# Patient Record
Sex: Male | Born: 1940
Health system: Southern US, Community
[De-identification: ages and names within clinical notes are randomized; demographics above are authoritative.]

## PROBLEM LIST (undated history)

## (undated) DIAGNOSIS — I10 Essential (primary) hypertension: Secondary | ICD-10-CM

## (undated) DIAGNOSIS — C801 Malignant (primary) neoplasm, unspecified: Secondary | ICD-10-CM

## (undated) HISTORY — PX: COLONOSCOPY: SHX174

---

## 2010-02-03 ENCOUNTER — Emergency Department (HOSPITAL_COMMUNITY): Admission: EM | Admit: 2010-02-03 | Discharge: 2010-02-03 | Payer: Self-pay | Admitting: Family Medicine

## 2010-02-18 ENCOUNTER — Ambulatory Visit: Payer: Self-pay | Admitting: Internal Medicine

## 2010-02-18 DIAGNOSIS — F411 Generalized anxiety disorder: Secondary | ICD-10-CM | POA: Insufficient documentation

## 2010-02-18 DIAGNOSIS — I10 Essential (primary) hypertension: Secondary | ICD-10-CM

## 2010-02-18 DIAGNOSIS — H409 Unspecified glaucoma: Secondary | ICD-10-CM | POA: Insufficient documentation

## 2010-02-18 DIAGNOSIS — E785 Hyperlipidemia, unspecified: Secondary | ICD-10-CM

## 2010-02-18 LAB — CONVERTED CEMR LAB
Albumin: 4.3 g/dL (ref 3.5–5.2)
Basophils Absolute: 0.1 10*3/uL (ref 0.0–0.1)
Basophils Relative: 0.7 % (ref 0.0–3.0)
Bilirubin Urine: NEGATIVE
CO2: 32 meq/L (ref 19–32)
Calcium: 9.7 mg/dL (ref 8.4–10.5)
Chloride: 100 meq/L (ref 96–112)
Cholesterol: 334 mg/dL — ABNORMAL HIGH (ref 0–200)
Eosinophils Absolute: 0.1 10*3/uL (ref 0.0–0.7)
Glucose, Bld: 89 mg/dL (ref 70–99)
HCT: 44.2 % (ref 39.0–52.0)
HDL: 39.5 mg/dL (ref >39.00)
Hemoglobin: 14.7 g/dL (ref 13.0–17.0)
Ketones, ur: NEGATIVE mg/dL
Leukocytes, UA: NEGATIVE
Lymphs Abs: 1.4 10*3/uL (ref 0.7–4.0)
MCHC: 33.3 g/dL (ref 30.0–36.0)
MCV: 78.9 fL (ref 78.0–100.0)
Monocytes Absolute: 0.7 10*3/uL (ref 0.1–1.0)
Neutro Abs: 4.7 10*3/uL (ref 1.4–7.7)
Nitrite: NEGATIVE
PSA: 2.31 ng/mL (ref 0.10–4.00)
RBC: 5.61 M/uL (ref 4.22–5.81)
RDW: 14.4 % (ref 11.5–14.6)
Sodium: 140 meq/L (ref 135–145)
Specific Gravity, Urine: >=1.03 (ref 1.000–1.030)
Total Protein, Urine: NEGATIVE mg/dL
Total Protein: 7.6 g/dL (ref 6.0–8.3)
VLDL: 25.4 mg/dL (ref 0.0–40.0)
pH: 5 (ref 5.0–8.0)

## 2010-02-26 ENCOUNTER — Encounter (INDEPENDENT_AMBULATORY_CARE_PROVIDER_SITE_OTHER): Payer: Self-pay | Admitting: *Deleted

## 2010-03-03 ENCOUNTER — Encounter (INDEPENDENT_AMBULATORY_CARE_PROVIDER_SITE_OTHER): Payer: Self-pay

## 2010-03-05 ENCOUNTER — Ambulatory Visit: Payer: Self-pay | Admitting: Internal Medicine

## 2010-03-19 ENCOUNTER — Ambulatory Visit: Payer: Self-pay | Admitting: Internal Medicine

## 2010-03-19 ENCOUNTER — Telehealth: Payer: Self-pay | Admitting: Internal Medicine

## 2010-03-26 ENCOUNTER — Encounter: Payer: Self-pay | Admitting: Internal Medicine

## 2010-03-27 ENCOUNTER — Ambulatory Visit: Payer: Self-pay | Admitting: Internal Medicine

## 2010-03-27 ENCOUNTER — Inpatient Hospital Stay (HOSPITAL_COMMUNITY): Admission: EM | Admit: 2010-03-27 | Discharge: 2010-03-29 | Payer: Self-pay | Admitting: Emergency Medicine

## 2010-04-02 ENCOUNTER — Ambulatory Visit: Payer: Self-pay | Admitting: Internal Medicine

## 2010-04-07 ENCOUNTER — Encounter: Payer: Self-pay | Admitting: Internal Medicine

## 2010-05-06 ENCOUNTER — Ambulatory Visit: Payer: Self-pay | Admitting: Internal Medicine

## 2010-05-06 DIAGNOSIS — N529 Male erectile dysfunction, unspecified: Secondary | ICD-10-CM

## 2010-05-06 LAB — CONVERTED CEMR LAB
BUN: 17 mg/dL (ref 6–23)
Basophils Relative: 0.4 % (ref 0.0–3.0)
Bilirubin, Direct: 0 mg/dL (ref 0.0–0.3)
CO2: 27 meq/L (ref 19–32)
Chloride: 109 meq/L (ref 96–112)
Eosinophils Relative: 2.4 % (ref 0.0–5.0)
GFR calc non Af Amer: 79.97 mL/min (ref >60)
Glucose, Bld: 101 mg/dL — ABNORMAL HIGH (ref 70–99)
HDL: 43.1 mg/dL (ref >39.00)
Hemoglobin: 12.4 g/dL — ABNORMAL LOW (ref 13.0–17.0)
Lymphocytes Relative: 18.2 % (ref 12.0–46.0)
MCHC: 32.8 g/dL (ref 30.0–36.0)
MCV: 80.1 fL (ref 78.0–100.0)
Neutro Abs: 5.5 10*3/uL (ref 1.4–7.7)
Neutrophils Relative %: 70.5 % (ref 43.0–77.0)
Potassium: 4.5 meq/L (ref 3.5–5.1)
RBC: 4.73 M/uL (ref 4.22–5.81)
Sodium: 143 meq/L (ref 135–145)
Total Bilirubin: 0.5 mg/dL (ref 0.3–1.2)
Total CHOL/HDL Ratio: 5
Triglycerides: 87 mg/dL (ref 0.0–149.0)
WBC: 7.8 10*3/uL (ref 4.5–10.5)

## 2010-05-07 ENCOUNTER — Encounter: Payer: Self-pay | Admitting: Internal Medicine

## 2010-05-07 LAB — CONVERTED CEMR LAB
Sex Hormone Binding: 54 nmol/L (ref 13–71)
Testosterone Free: 45.9 pg/mL — ABNORMAL LOW (ref 47.0–244.0)
Testosterone-% Free: 1.4 % — ABNORMAL LOW (ref 1.6–2.9)

## 2010-05-18 ENCOUNTER — Ambulatory Visit: Payer: Self-pay | Admitting: Internal Medicine

## 2010-05-18 ENCOUNTER — Telehealth: Payer: Self-pay | Admitting: Internal Medicine

## 2010-05-18 DIAGNOSIS — Z860100 Personal history of colon polyps, unspecified: Secondary | ICD-10-CM | POA: Insufficient documentation

## 2010-05-18 DIAGNOSIS — D126 Benign neoplasm of colon, unspecified: Secondary | ICD-10-CM

## 2010-05-18 DIAGNOSIS — D62 Acute posthemorrhagic anemia: Secondary | ICD-10-CM

## 2010-06-01 ENCOUNTER — Encounter (INDEPENDENT_AMBULATORY_CARE_PROVIDER_SITE_OTHER): Payer: Self-pay | Admitting: *Deleted

## 2010-08-11 ENCOUNTER — Ambulatory Visit: Payer: Self-pay | Admitting: Internal Medicine

## 2010-08-11 DIAGNOSIS — R51 Headache: Secondary | ICD-10-CM

## 2010-08-11 DIAGNOSIS — R519 Headache, unspecified: Secondary | ICD-10-CM | POA: Insufficient documentation

## 2010-10-08 ENCOUNTER — Ambulatory Visit: Payer: Self-pay | Admitting: Internal Medicine

## 2010-10-08 LAB — CONVERTED CEMR LAB
BUN: 23 mg/dL (ref 6–23)
Calcium: 9.1 mg/dL (ref 8.4–10.5)
Chloride: 107 meq/L (ref 96–112)
Cholesterol, target level: 200 mg/dL
Creatinine, Ser: 1.1 mg/dL (ref 0.4–1.5)
Eosinophils Absolute: 0.3 10*3/uL (ref 0.0–0.7)
Eosinophils Relative: 3.4 % (ref 0.0–5.0)
LDL Goal: 130 mg/dL
Lymphocytes Relative: 18.3 % (ref 12.0–46.0)
MCV: 79.3 fL (ref 78.0–100.0)
Monocytes Absolute: 1 10*3/uL (ref 0.1–1.0)
Neutrophils Relative %: 66.7 % (ref 43.0–77.0)
Platelets: 256 10*3/uL (ref 150.0–400.0)
TSH: 1.97 microintl units/mL (ref 0.35–5.50)
Transferrin: 292.2 mg/dL (ref 212.0–360.0)
Vitamin B-12: 466 pg/mL (ref 211–911)
WBC: 8.8 10*3/uL (ref 4.5–10.5)

## 2010-12-01 NOTE — Assessment & Plan Note (Signed)
Summary: 3 MO ROV /NWS   Vital Signs:  Patient profile:   70 year old male Height:      67.5 inches Weight:      180 pounds BMI:     27.88 O2 Sat:      97 % on Room air Temp:     98.9 degrees F oral Pulse rate:   48 / minute BP sitting:   162 / 88  (left arm) Cuff size:   regular  Vitals Entered By: Zella Ball Ewing CMA Duncan Dull) (August 11, 2010 10:41 AM)  O2 Flow:  Room air CC: 3 month ROV/RE   Primary Care Provider:  Oliver Barre, MD  CC:  3 month ROV/RE.  History of Present Illness: here for f/u routine ;  Pt denies CP, worsening sob, doe, wheezing, orthopnea, pnd, worsening LE edema, palps, dizziness or syncope  Pt denies new neuro symptoms such as facial or extremity weakness No fever, wt loss, night sweats, loss of appetite or other constitutional symptoms  Pt denies polydipsia, polyuria  Overall good compliance with meds, trying to follow low chol, DM diet, wt stable, little excercise however .  Does have c/o also of nagging HA yesterday and could not play golf but resolved today.   No other new complaints.  Problems Prior to Update: 1)  Colonic Polyps, Adenomatous, Hx of  (ICD-V12.72) 2)  Colonic Polyps, Adenomatous  (ICD-211.3) 3)  Hepatotoxicity, Drug-induced, Risk of  (ICD-V58.69) 4)  Erectile Dysfunction, Organic  (ICD-607.84) 5)  Anemia, Secondary To Acute Blood Loss  (ICD-285.1) 6)  Hyperlipidemia  (ICD-272.4) 7)  Anxiety  (ICD-300.00) 8)  Glaucoma  (ICD-365.9) 9)  Hypertension  (ICD-401.9)  Medications Prior to Update: 1)  Aspir-Low 81 Mg Tbec (Aspirin) .Marland Kitchen.. 1po Once Daily 2)  Simvastatin 40 Mg Tabs (Simvastatin) .Marland Kitchen.. 1 By Mouth Once Daily 3)  Cozaar 50 Mg Tabs (Losartan Potassium) .... 1/2 Tablet By Mouth Once Daily  Current Medications (verified): 1)  Aspir-Low 81 Mg Tbec (Aspirin) .Marland Kitchen.. 1po Once Daily 2)  Simvastatin 40 Mg Tabs (Simvastatin) .Marland Kitchen.. 1 By Mouth Once Daily 3)  Losartan Potassium 100 Mg Tabs (Losartan Potassium) .Marland Kitchen.. 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   Head:  normocephalic and atraumatic.   Eyes:  vision grossly intact, pupils equal, and pupils round.   Ears:  R ear normal and L ear normal.   Nose:  no external deformity and no nasal discharge.   Mouth:  no gingival abnormalities and pharynx pink and moist.   Neck:  supple and no masses.   Lungs:  normal respiratory effort and normal breath sounds.   Heart:  normal rate and regular rhythm.   Extremities:  no edema, no erythema  Neurologic:  strength normal in all extremities, sensation intact to light touch, gait normal, and DTRs symmetrical and normal.     Impression & Recommendations:  Problem # 1:  HYPERTENSION (ICD-401.9)  His updated medication list for this problem includes:    Losartan Potassium 100 Mg Tabs (Losartan potassium) .Marland Kitchen... 1po once daily  BP today: 162/88 Prior BP: 152/78 (05/18/2010)  Labs Reviewed: K+: 4.5 (05/06/2010) Creat: : 1.2 (05/06/2010)   Chol: 218 (05/06/2010)   HDL: 43.10 (05/06/2010)   TG: 87.0 (05/06/2010) uncontrolled - treat as above, f/u any worsening signs or symptoms, with increased losartan  Problem # 2:  HEADACHE (ICD-784.0)  His updated medication list for this problem includes:    Aspir-low 81 Mg Tbec (Aspirin) .Marland Kitchen... 1po once daily unclear etiology, exam  benign, ok to Continue all previous medications as before this visit , f/u any worsening s/s  Problem # 3:  HYPERLIPIDEMIA (ICD-272.4)  His updated medication list for this problem includes:    Simvastatin 40 Mg Tabs (Simvastatin) .Marland Kitchen... 1 by mouth once daily  Labs Reviewed: SGOT: 19 (05/06/2010)   SGPT: 27 (05/06/2010)   HDL:43.10 (05/06/2010), 39.50 (02/18/2010)  Chol:218 (05/06/2010), 334 (02/18/2010)  Trig:87.0 (05/06/2010), 127.0 (02/18/2010) d/w pt - Pt to continue diet efforts, declines statin or other med today, to check labs next visit - goal LDL less than 100  Complete Medication List: 1)  Aspir-low 81 Mg Tbec (Aspirin) .Marland Kitchen.. 1po once daily 2)  Simvastatin 40 Mg Tabs (Simvastatin) .Marland Kitchen.. 1 by mouth once daily 3)  Losartan Potassium 100 Mg Tabs (Losartan potassium) .Marland Kitchen.. 1po once daily  Patient Instructions: 1)  increase the cozaar to 100 mg per day 2)  Continue all previous medications as before this visit 3)  Please schedule a follow-up appointment in 6 months with CPX labs Prescriptions: LOSARTAN POTASSIUM 100 MG TABS (LOSARTAN POTASSIUM) 1po once daily  #90 x 3   Entered and Authorized by:   Corwin Levins MD   Signed by:   Corwin Levins MD on 08/11/2010   Method used:   Print then Give to Patient   RxID:   [id number]

## 2010-12-01 NOTE — Assessment & Plan Note (Signed)
Summary: NEW/S E COMMUNITY CARE/NWS   Vital Signs:  Patient profile:   70 year old male Height:      68 inches Weight:      182.50 pounds BMI:     27.85 O2 Sat:      98 % on Room air Temp:     97 degrees F oral Pulse rate:   66 / minute BP sitting:   120 / 72  (left arm) Cuff size:   regular  Vitals Entered ByZella Ball Ewing (February 18, 2010 8:55 AM)  O2 Flow:  Room air  Preventive Care Screening     declines all immunizations due to  needle phobia  CC: New Patient/RE   CC:  New Patient/RE.  History of Present Illness: seen in ER recently with HA and severe elev BP - exam o/w benign apparetnly, started HCTZ 25 mg  and released;  here today to establish - HA's now resolved and BP improved, admits to high salt diet prior to headaches;  has been checking BP at the drugstore every day since then, fluctuates some but more ave  144/72 -86 per nurse from [employer].  Gets hungry  sometimes b/c all of a sudden he is afraiad to eat usual diet b/c prior diet caused headaches.  Has not been to dietary clinic .   Preventive Screening-Counseling & Management  Alcohol-Tobacco     Smoking Status: quit      Drug Use:  no.    Problems Prior to Update: None  Medications Prior to Update: 1)  None  Current Medications (verified): 1)  Hydrochlorothiazide 25 Mg Tabs (Hydrochlorothiazide) .Marland Kitchen.. 1 By Mouth Once Daily  Allergies (verified): No Known Drug Allergies  Past History:  Past Medical History: Hypertension Glaucoma arthritis Anxiety  Past Surgical History: Denies surgical history  Family History: Reviewed history and no changes required. father unkown mother with dementia 2 sister and 1 aunt  with glaucoma  Social History: Reviewed history and no changes required. 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         all otherwise negative per pt -  -  excercise 4 days pulmonary emboli wk - cardio, walking and wt rooms - uses excercise to control anxiety  Physical Exam  General:  alert and well-developed.  , athletic appearing Head:  normocephalic and atraumatic.   Eyes:  vision grossly intact, pupils equal, and pupils round.   Ears:  R ear normal and L ear normal.   Nose:  no external deformity and no nasal discharge.   Mouth:  no gingival abnormalities and pharynx pink and moist.   Neck:  supple and no masses.   Lungs:  normal respiratory effort and normal breath sounds.   Heart:  normal rate and regular rhythm.   Abdomen:  soft, non-tender, and normal bowel sounds.   Msk:  no joint tenderness, no joint swelling Extremities:  no edema, no erythema  Neurologic:  cranial nerves II-XII intact and strength normal in all extremities.     Impression & Recommendations:  Problem # 1:  Preventive Health Care (ICD-V70.0)  Overall doing well, age appropriate education and counseling updated and referral for appropriate preventive services done unless declined, immunizations up to date or declined, diet counseling done if overweight, urged to quit smoking if smokes , most recent labs reviewed and current ordered if appropriate, ecg reviewed or declined (interpretation per ECG scanned in the EMR if done); information regarding Medicare Prevention requirements given if appropriate   Orders: TLB-BMP (Basic Metabolic Panel-BMET) (80048-METABOL) TLB-CBC Platelet - w/Differential  (85025-CBCD) TLB-Hepatic/Liver Function Pnl (80076-HEPATIC) TLB-Lipid Panel (80061-LIPID) TLB-TSH (Thyroid Stimulating Hormone) (84443-TSH) TLB-PSA (Prostate Specific Antigen) (84153-PSA) TLB-Udip ONLY (81003-UDIP) Gastroenterology Referral (GI)  Problem # 2:  HYPERTENSION (ICD-401.9)  His updated medication list for this problem includes:    Hydrochlorothiazide 25 Mg Tabs (Hydrochlorothiazide) .Marland Kitchen... 1 by mouth once daily improved, cont med as is; for ECG today  Orders: EKG w/ Interpretation (93000) Misc. Referral (Misc. Ref)  Complete Medication List: 1)  Hydrochlorothiazide 25 Mg Tabs (Hydrochlorothiazide) .Marland Kitchen.. 1 by mouth once daily  Patient Instructions: 1)  Continue all previous medications as before this visit  2)  You will be contacted about the referral(s) to: colonoscopy, and dietary clinic 3)  Please go to the Lab in the basement for your blood and/or urine tests today 4)  Your EKG was good today 5)  Please schedule a follow-up appointment in 6 months, or sooner if needed Prescriptions: HYDROCHLOROTHIAZIDE 25 MG TABS (HYDROCHLOROTHIAZIDE) 1 by mouth once daily  #100 x 3   Entered and Authorized by:   Corwin Levins MD   Signed by:   Corwin Levins MD on 02/18/2010   Method used:   Print then Give to Patient   RxID:   [id number]

## 2010-12-01 NOTE — Letter (Signed)
Summary: Previsit letter  [facility]  [address] [place], [place] [address]   Phone: [phone]  Fax: [phone]       02/26/2010 MRN: [id number]  [facility] [address] [place], [place]  [address]  Dear Zachary Hobbs,  Welcome to the Gastroenterology Division at [facility].    You are scheduled to see a nurse for your pre-procedure visit on 03-05-10 at 10:00a.m. on the 3rd floor at [facility], [address]  We ask that you try to arrive at our office 15 minutes prior to your appointment time to allow for check-in.  Your nurse visit will consist of discussing your medical and surgical history, your immediate family medical history, and your medications.    Please bring a complete list of all your medications or, if you prefer, bring the medication bottles and we will list them.  We will need to be aware of both prescribed and over the counter drugs.  We will need to know exact dosage information as well.  If you are on blood thinners (Coumadin, Plavix, Aggrenox, Ticlid, etc.) please call our office today/prior to your appointment, as we need to consult with your physician about holding your medication.   Please be prepared to read and sign documents such as consent forms, a financial agreement, and acknowledgement forms.  If necessary, and with your consent, a friend or relative is welcome to sit-in on the nurse visit with you.  Please bring your insurance card so that we may make a copy of it.  If your insurance requires a referral to see a specialist, please bring your referral form from your primary care physician.  No co-pay is required for this nurse visit.     If you cannot keep your appointment, please call [phone] to cancel or reschedule prior to your appointment date.  This allows [place] the opportunity to schedule an appointment for another patient in need of care.    Thank you for choosing [facility] for your medical needs.   We appreciate the opportunity to care for you.  Please visit [place] at our website  to learn more about our practice.                     Sincerely.                                                                                                                   The Gastroenterology Division

## 2010-12-01 NOTE — Letter (Signed)
Summary: Colonoscopy Date Change Letter  [facility]  [address] [place], [place] [address]   Phone: [phone]  Fax: [phone]      June 01, 2010 MRN: [id number]   [facility] [address] [place], [place]  [address]   Dear Zachary Hobbs,   Previously you were recommended to have a repeat colonoscopy around this time. Your chart was recently reviewed by Dr. Leone Payor of [facility]. Follow up colonoscopy is now recommended in October 2011. This revised recommendation is based on current, nationally recognized guidelines for colorectal cancer screening and polyp surveillance. These guidelines are endorsed by the American Cancer Society, [employer] on Colorectal Cancer as well as numerous other major medical organizations.  Please understand that our recommendation assumes that you do not have any new symptoms such as bleeding, a change in bowel habits, anemia, or significant abdominal discomfort. If you do have any concerning GI symptoms or want to discuss the guideline recommendations, please call to arrange an office visit at your earliest convenience. Otherwise we will keep you in our reminder system and contact you 1-2 months prior to the date listed above to schedule your next colonoscopy.  Thank you,   Iva Boop, M.D.  [facility]enter[facility] Division [phone]

## 2010-12-01 NOTE — Assessment & Plan Note (Signed)
Summary: elev bp/john/cd   Vital Signs:  Patient profile:   70 year old male Height:      67.5 inches Weight:      181 pounds BMI:     28.03 O2 Sat:      98 % on Room air Temp:     97.1 degrees F oral Pulse rate:   57 / minute Pulse rhythm:   regular Resp:     16 per minute BP sitting:   180 / 90  (left arm) Cuff size:   large  Vitals Entered By: Rock Nephew CMA (October 08, 2010 2:01 PM)  Nutrition Counseling: Patient's BMI is greater than 25 and therefore counseled on weight management options.  O2 Flow:  Room air  Primary Care Provider:  Oliver Barre, MD   History of Present Illness:  Hypertension Follow-Up      This is a 70 year old man who presents for Hypertension follow-up.  The patient reports headaches, but denies lightheadedness, urinary frequency, edema, impotence, rash, and fatigue.  The patient denies the following associated symptoms: chest pain, chest pressure, exercise intolerance, dyspnea, palpitations, syncope, leg edema, and pedal edema.  Compliance with medications (by patient report) has been near 100%.  The patient reports that dietary compliance has been excellent.  The patient reports exercising 3-4X per week.  Adjunctive measures currently used by the patient include salt restriction and relaxation.    Lipid Management History:      Positive NCEP/ATP III risk factors include male age 68 years old or older and hypertension.  Negative NCEP/ATP III risk factors include non-diabetic, no family history for ischemic heart disease, non-tobacco-user status, no ASHD (atherosclerotic heart disease), no prior stroke/TIA, no peripheral vascular disease, and no history of aortic aneurysm.        The patient states that he knows about the "Therapeutic Lifestyle Change" diet.  His compliance with the TLC diet is excellent.  The patient expresses understanding of adjunctive measures for cholesterol lowering.  Adjunctive measures started by the patient include aerobic  exercise, fiber, ASA, omega-3 supplements, limit alcohol consumpton, and weight reduction.  He expresses no side effects from his lipid-lowering medication.  The patient denies any symptoms to suggest myopathy or liver disease.     Preventive Screening-Counseling & Management  Alcohol-Tobacco     Alcohol drinks/day: 0     Alcohol Counseling: not indicated; patient does not drink     Smoking Status: quit     Tobacco Counseling: not to resume use of tobacco products  Caffeine-Diet-Exercise     Does Patient Exercise: yes     [facility]     Exercise (avg: min/session): 30-60     Times/week: 4  Hep-HIV-STD-Contraception     Hepatitis Risk: no risk noted     HIV Risk: no risk noted     STD Risk: no risk noted      Drug Use:  no.    Clinical Review Panels:  Prevention   Last Colonoscopy:  Pathology:  Adenomatous polyp.   Location:  [facility].  ENDOSCOPIC IMPRESSION:     1) 2 - 3 cm sessile polyp in the cecum - partially removed     2) 2 mm diminutive polyp in the cecum - removed     3) Five polyps in the left colon - removed     4) Otherwise normal examination - excellent prep     RECOMMENDATIONS:     1) No  aspirin or NSAID's for 2 weeks     2) Await pathology results     3) Repeat colonoscopic polypectomy with APC available vs.     surgical resection of residual cecal polyp will be needed.     4) He is having significant myalgias in legs with Crestor and I     advised him to hold it. I will let Dr. Jonny Ruiz know.  ***Microscopic Examination and Diagnosis***   1. COLON, POLYP(S), CECUM : TUBULAR ADENOMA.NO HIGH GRADE DYSPLASIA OR  MALIGNANCY IDENTIFIED (TWO FRAGMENTS).   2. COLON, POLYP(S), CECUM : MULTIPLE FRAGMENTS OF TUBULOVILLOUS ADENOMA.NO HIGH  GRADE DYSPLASIA OR MALIGNANCY.   3. COLON, POLYP(S), SPLENIC FLEXURE, SIGMOID, RECTUM : MULTIPLE FRAGMENTS OF  TUBULAR ADENOMA, NO HIGH GRADE DYSPLASIA OR MALIGNANCY. (03/19/2010)   Last PSA:  2.31  (02/18/2010)  Lipid Management   Cholesterol:  218 (05/06/2010)   HDL (good cholesterol):  43.10 (05/06/2010)  Diabetes Management   Creatinine:  1.2 (05/06/2010)  CBC   WBC:  7.8 (05/06/2010)   RBC:  4.73 (05/06/2010)   Hgb:  12.4 (05/06/2010)   Hct:  37.9 (05/06/2010)   Platelets:  266.0 (05/06/2010)   MCV  80.1 (05/06/2010)   MCHC  32.8 (05/06/2010)   RDW  15.9 (05/06/2010)   PMN:  70.5 (05/06/2010)   Lymphs:  18.2 (05/06/2010)   Monos:  8.5 (05/06/2010)   Eosinophils:  2.4 (05/06/2010)   Basophil:  0.4 (05/06/2010)  Complete Metabolic Panel   Glucose:  101 (05/06/2010)   Sodium:  143 (05/06/2010)   Potassium:  4.5 (05/06/2010)   Chloride:  109 (05/06/2010)   CO2:  27 (05/06/2010)   BUN:  17 (05/06/2010)   Creatinine:  1.2 (05/06/2010)   Albumin:  4.1 (05/06/2010)   Total Protein:  7.2 (05/06/2010)   Calcium:  8.9 (05/06/2010)   Total Bili:  0.5 (05/06/2010)   Alk Phos:  98 (05/06/2010)   SGPT (ALT):  27 (05/06/2010)   SGOT (AST):  19 (05/06/2010)   Medications Prior to Update: 1)  Aspir-Low 81 Mg Tbec (Aspirin) .Marland Kitchen.. 1po Once Daily 2)  Simvastatin 40 Mg Tabs (Simvastatin) .Marland Kitchen.. 1 By Mouth Once Daily 3)  Losartan Potassium 100 Mg Tabs (Losartan Potassium) .Marland Kitchen.. 1po Once Daily  Current Medications (verified): 1)  Aspir-Low 81 Mg Tbec (Aspirin) .Marland Kitchen.. 1po Once Daily 2)  Simvastatin 40 Mg Tabs (Simvastatin) .Marland Kitchen.. 1 By Mouth Once Daily 3)  Diovan Hct 320-12.5 Mg Tabs (Valsartan-Hydrochlorothiazide) .... One By Mouth Once Daily For High Blood Pressure  Allergies (verified): 1)  * Crestor 2)  * Hctz  Past History:  Past Medical History: Last updated: 05/18/2010 Hypertension Glaucoma arthritis Anxiety Hyperlipidemia Anemia-post-colonoscopy - polypectomy hemorrage May 2011 Adenomatous colon polyps May 2011  Past Surgical History: Last updated: 02/18/2010 Denies surgical history  Family History: Last updated: 02/18/2010 father unkown mother with  dementia 2 sister and 1 aunt  with glaucoma  Social History: Last updated: 05/18/2010 Former Smoker Alcohol use-no Drug use-no Divorced children - son with Down's (age 11) 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 father unkown mother with dementia 2 sister and 1 aunt  with glaucoma  Social History: Reviewed history from 05/18/2010 and no changes required. 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   Heme:  Denies abnormal bruising, bleeding, enlarge lymph nodes, fevers, pallor, and skin discoloration.  Physical Exam  General:  alert, well-developed, well-nourished, well-hydrated, appropriate dress, normal appearance, healthy-appearing, and cooperative to examination.   Head:  normocephalic, atraumatic, no abnormalities observed, and no abnormalities palpated.   Eyes:  vision grossly intact, pupils equal, pupils round, pupils reactive to light, and a-v nicking.   Ears:  R ear normal and L ear normal.   Mouth:  Oral mucosa and oropharynx without lesions or exudates.  Teeth in good repair. Neck:  supple, full ROM, and no masses.   Lungs:  normal respiratory effort, no intercostal retractions, no accessory muscle use, normal breath sounds, no dullness, no fremitus, no crackles, and no wheezes.   Heart:   normal rate, regular rhythm, no murmur, no gallop, no rub, and no JVD.   Abdomen:  soft, non-tender, normal bowel sounds, no distention, no masses, no guarding, no rigidity, no rebound tenderness, no abdominal hernia, no inguinal hernia, no hepatomegaly, and no splenomegaly.   Msk:  normal ROM, no joint tenderness, no joint swelling, no joint warmth, no redness over joints, no joint deformities, no joint instability, and no crepitation.   Pulses:  R and L carotid,radial,femoral,dorsalis pedis and posterior tibial pulses are full and equal bilaterally Extremities:  No clubbing, cyanosis, edema, or deformity noted with normal full range of motion of all joints.   Neurologic:  No cranial nerve deficits noted. Station and gait are normal. Plantar reflexes are down-going bilaterally. DTRs are symmetrical throughout. Sensory, motor and coordinative functions appear intact. Skin:  turgor normal, color normal, no rashes, no suspicious lesions, no ecchymoses, no petechiae, no purpura, no ulcerations, and no edema.   Cervical Nodes:  no anterior cervical adenopathy and no posterior cervical adenopathy.   Axillary Nodes:  no R axillary adenopathy and no L axillary adenopathy.   Psych:  Cognition and judgment appear intact. Alert and cooperative with normal attention span and concentration. No apparent delusions, illusions, hallucinations   Impression & Recommendations:  Problem # 1:  HYPERTENSION (ICD-401.9) Assessment Deteriorated will recheck his renal function since BP has been uncontrolled and change his antihypertensive regimen - since AA  men are usually low renin hypertensives will use HCTZ as the "backbone" The following medications were removed from the medication list:    Losartan Potassium 100 Mg Tabs (Losartan potassium) .Marland Kitchen... 1po once daily His updated medication list for this problem includes:    Diovan Hct 320-12.5 Mg Tabs (Valsartan-hydrochlorothiazide) ..... One by mouth once daily for high  blood pressure  Orders: Venipuncture (04540) TLB-BMP (Basic Metabolic Panel-BMET) (80048-METABOL) TLB-CBC Platelet - w/Differential (85025-CBCD) TLB-TSH (Thyroid Stimulating Hormone) (84443-TSH) TLB-B12 + Folate Pnl ([id number]-N56/OZH) TLB-IBC Pnl (Iron/FE;Transferrin) (83550-IBC)  BP today: 180/90 Prior BP: 162/88 (08/11/2010)  Labs Reviewed: K+: 4.5 (05/06/2010) Creat: : 1.2 (05/06/2010)   Chol: 218 (05/06/2010)   HDL: 43.10 (05/06/2010)   TG: 87.0 (05/06/2010)  Problem # 2:  ANEMIA, SECONDARY TO ACUTE BLOOD LOSS (ICD-285.1) Assessment: Unchanged  will see if his anemia has recovered or if he has a deficiency of iron or B12 Orders: Venipuncture (08657) TLB-BMP (Basic Metabolic  Panel-BMET) (80048-METABOL) TLB-CBC Platelet - w/Differential (85025-CBCD) TLB-TSH (Thyroid Stimulating Hormone) (84443-TSH) TLB-B12 + Folate Pnl ([id number]-X44/YJE) TLB-IBC Pnl (Iron/FE;Transferrin) (83550-IBC)  Hgb: 12.4 (05/06/2010)   Hct: 37.9 (05/06/2010)   Platelets: 266.0 (05/06/2010) RBC: 4.73 (05/06/2010)   RDW: 15.9 (05/06/2010)   WBC: 7.8 (05/06/2010) MCV: 80.1 (05/06/2010)   MCHC: 32.8 (05/06/2010) TSH: 1.89 (02/18/2010)  Problem # 3:  HYPERLIPIDEMIA (ICD-272.4) Assessment: Unchanged  His updated medication list for this problem includes:    Simvastatin 40 Mg Tabs (Simvastatin) .Marland Kitchen... 1 by mouth once daily  Labs Reviewed: SGOT: 19 (05/06/2010)   SGPT: 27 (05/06/2010)  Lipid Goals: Chol Goal: 200 (10/08/2010)   HDL Goal: 40 (10/08/2010)   LDL Goal: 130 (10/08/2010)   TG Goal: 150 (10/08/2010)  10 Yr Risk Heart Disease: Not enough information   HDL:43.10 (05/06/2010), 39.50 (02/18/2010)  Chol:218 (05/06/2010), 334 (02/18/2010)  Trig:87.0 (05/06/2010), 127.0 (02/18/2010)  Complete Medication List: 1)  Aspir-low 81 Mg Tbec (Aspirin) .Marland Kitchen.. 1po once daily 2)  Simvastatin 40 Mg Tabs (Simvastatin) .Marland Kitchen.. 1 by mouth once daily 3)  Diovan Hct 320-12.5 Mg Tabs  (Valsartan-hydrochlorothiazide) .... One by mouth once daily for high blood pressure  Lipid Assessment/Plan:      Based on NCEP/ATP III, the patient's risk factor category is "2 or more risk factors and a calculated 10 year CAD risk of > 20%".  The patient's lipid goals are as follows: Total cholesterol goal is 200; LDL cholesterol goal is 130; HDL cholesterol goal is 40; Triglyceride goal is 150.    Patient Instructions: 1)  Please schedule a follow-up appointment in 2 months. 2)  Check your Blood Pressure regularly. If it is above 140/90: you should make an appointment. Prescriptions: DIOVAN HCT 320-12.5 MG TABS (VALSARTAN-HYDROCHLOROTHIAZIDE) One by mouth once daily for high blood pressure  #112 x 0   Entered and Authorized by:   Etta Grandchild MD   Signed by:   Etta Grandchild MD on 10/08/2010   Method used:   Samples Given   RxID:   [id number]    Orders Added: 1)  Venipuncture [36415] 2)  TLB-BMP (Basic Metabolic Panel-BMET) [80048-METABOL] 3)  TLB-CBC Platelet - w/Differential [85025-CBCD] 4)  TLB-TSH (Thyroid Stimulating Hormone) [84443-TSH] 5)  TLB-B12 + Folate Pnl [82746_82607-B12/FOL] 6)  TLB-IBC Pnl (Iron/FE;Transferrin) [83550-IBC] 7)  Est. Patient Level IV [50277]

## 2010-12-01 NOTE — Progress Notes (Signed)
Summary: Crestor causing leg pains  Phone Note Other Incoming   Summary of Call: Patient here for colonoscopy and complained of severe leg pains since starting Crestor. i advised him to hold it and that I would communicate this to Dr. Jonny Ruiz. He has been sedated and I would wait til at least tomorrow to advise furtheron this. Iva Boop MD, [facility]  Mar 19, 2010 11:14 AM   Follow-up for Phone Call        ok to stop crestor  rov 2 wks to re-assess Follow-up by: Corwin Levins MD,  Mar 19, 2010 2:26 PM  Additional Follow-up for Phone Call Additional follow up Details #1::        pt advised and transferred to sch ROV Additional Follow-up by: Margaret Pyle, CMA,  Mar 20, 2010 8:39 AM    New/Updated Medications: CRESTOR 20 MG TABS (ROSUVASTATIN CALCIUM) 1 by mouth once daily

## 2010-12-01 NOTE — Assessment & Plan Note (Signed)
Summary: 1 mo rov /nws  #   Vital Signs:  Patient profile:   69 year old male Height:      68 inches Weight:      177 pounds BMI:     27.01 O2 Sat:      98 % on Room air Temp:     97.4 degrees F oral Pulse rate:   53 / minute BP sitting:   152 / 82  (left arm) Cuff size:   regular  Vitals Entered By: Zella Ball Ewing CMA Duncan Dull) (May 06, 2010 8:18 AM)  O2 Flow:  Room air CC: 1 month ROV/RE   CC:  1 month ROV/RE.  History of Present Illness: overall doing ok, BP at the drug store persistently at the 148/90 range per pt;  Pt denies CP, sob, doe, wheezing, orthopnea, pnd, worsening LE edema, palps, dizziness or syncope  Pt denies new neuro symptoms such as headache, facial or extremity weakness Has been trying to follow lower chol diet, overall good complaicne with meds and no c/o med intolerance such as myalgias with the statin.  No overt bleeding or bruising of any kind, denies weakness or orthostasis.  Does c/o ongoing ED symtpoms worsening in the past 3 to 6 mo and now male partner is complaining.  He was given a cialis 20 mg from a friend and worked quite well.  Asks for rx.    Problems Prior to Update: 1)  Hepatotoxicity, Drug-induced, Risk of  (ICD-V58.69) 2)  Erectile Dysfunction, Organic  (ICD-607.84) 3)  Anemia-nos  (ICD-285.9) 4)  Hyperlipidemia  (ICD-272.4) 5)  Anxiety  (ICD-300.00) 6)  Preventive Health Care  (ICD-V70.0) 7)  Glaucoma  (ICD-365.9) 8)  Hypertension  (ICD-401.9)  Medications Prior to Update: 1)  Aspir-Low 81 Mg Tbec (Aspirin) .Marland Kitchen.. 1po Once Daily 2)  Simvastatin 40 Mg Tabs (Simvastatin) .Marland Kitchen.. 1 By Mouth Once Daily  Current Medications (verified): 1)  Aspir-Low 81 Mg Tbec (Aspirin) .Marland Kitchen.. 1po Once Daily 2)  Simvastatin 40 Mg Tabs (Simvastatin) .Marland Kitchen.. 1 By Mouth Once Daily 3)  Cozaar 50 Mg Tabs (Losartan Potassium) .Marland Kitchen.. 1 By Mouth Once Daily 4)  Cialis 20 Mg Tabs (Tadalafil) .Marland Kitchen.. 1 By Mouth Every Other Day As Needed 5)  Cialis 20 Mg Tabs (Tadalafil) .Marland Kitchen.. 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   Head:  normocephalic and atraumatic.   Eyes:  vision grossly intact, pupils equal, and pupils round.   Ears:  R ear normal and L ear normal.   Nose:  no external deformity and no nasal discharge.   Mouth:  no gingival abnormalities and pharynx pink and moist.   Neck:  supple and no masses.   Lungs:  normal respiratory effort and normal breath sounds.   Heart:  normal rate and regular rhythm.   Msk:  no joint tenderness and no joint swelling.   Extremities:  no edema, no erythema    Impression & Recommendations:  Problem #  1:  HYPERTENSION (ICD-401.9)  His updated medication list for this problem includes:    Cozaar 50 Mg Tabs (Losartan potassium) .Marland Kitchen... 1 by mouth once daily  Orders: TLB-BMP (Basic Metabolic Panel-BMET) (80048-METABOL)  BP today: 152/82 Prior BP: 120/70 (04/02/2010) start tx as above - f/u with BP at the drug store daily as he does, and next visit, check lab today Labs Reviewed: K+: 4.8 (02/18/2010) Creat: : 1.4 (02/18/2010)   Chol: 334 (02/18/2010)   HDL: 39.50 (02/18/2010)   TG: 127.0 (02/18/2010)  Problem # 2:  ANEMIA-NOS (ICD-285.9) asympt, no overt bleeding, for cbc f/u today Orders: TLB-CBC Platelet - w/Differential (85025-CBCD)  Problem # 3:  HYPERLIPIDEMIA (ICD-272.4)  His updated medication list for this problem includes:     Simvastatin 40 Mg Tabs (Simvastatin) .Marland Kitchen... 1 by mouth once daily  Orders: TLB-Lipid Panel (80061-LIPID)  Labs Reviewed: SGOT: 26 (02/18/2010)   SGPT: 31 (02/18/2010)   HDL:39.50 (02/18/2010)  Chol:334 (02/18/2010)  Trig:127.0 (02/18/2010) on new statin, tolerating well, good compliance,  for lab today, goal ldl < 100  Problem # 4:  ERECTILE DYSFUNCTION, ORGANIC (ICD-607.84)  The following medications were removed from the medication list:    Cialis 20 Mg Tabs (Tadalafil) .Marland Kitchen... 1po every other day as needed His updated medication list for this problem includes:    Cialis 20 Mg Tabs (Tadalafil) .Marland Kitchen... 1 by mouth every other day as needed  Orders: T-Testosterone, Free and Total [phone]) treat as above, f/u any worsening signs or symptoms   Complete Medication List: 1)  Aspir-low 81 Mg Tbec (Aspirin) .Marland Kitchen.. 1po once daily 2)  Simvastatin 40 Mg Tabs (Simvastatin) .Marland Kitchen.. 1 by mouth once daily 3)  Cozaar 50 Mg Tabs (Losartan potassium) .Marland Kitchen.. 1 by mouth once daily 4)  Cialis 20 Mg Tabs (Tadalafil) .Marland Kitchen.. 1 by mouth every other day as needed  Other Orders: TLB-Hepatic/Liver Function Pnl (80076-HEPATIC)  Patient Instructions: 1)  Please take all new medications as prescribed 2)  you are given the precription for the free cialis today 3)  Continue all previous medications as before this visit 4)  Please go to the Lab in the basement for your blood and/or urine tests today 5)  Please schedule a follow-up appointment in 3 months. 6)  Check your Blood Pressure regularly. If it is above 140/90: you should make an appointment. Prescriptions: CIALIS 20 MG TABS (TADALAFIL) 1po every other day as needed  #3 x 0   Entered and Authorized by:   Corwin Levins MD   Signed by:   Corwin Levins MD on 05/06/2010   Method used:   Print then Give to Patient   RxID:   [id number] CIALIS 20 MG TABS (TADALAFIL) 1 by mouth every other day as needed  #5 x 11   Entered and Authorized by:   Corwin Levins  MD   Signed by:   Corwin Levins MD on 05/06/2010   Method used:   Print then Give to Patient   RxID:   [id number] COZAAR 50 MG TABS (LOSARTAN POTASSIUM) 1 by mouth once daily  #30 x 11   Entered and Authorized by:   Corwin Levins MD   Signed by:   Corwin Levins MD on 05/06/2010   Method used:   Print then Give to Patient   RxID:   [id number]

## 2010-12-01 NOTE — Letter (Signed)
Summary: [facility] Instructions  [facility]  [address] [place], [place] [address]   Phone: [phone]  Fax: [phone]       Zachary Hobbs    1941/06/28    MRN: [id number]        Procedure Day Dorna Bloom:  Lenor Coffin 03/19/10     Arrival Time:  9:00am     Procedure Time:  10:00am     Location of Procedure:                    Juliann Pares _  [facility] (4th Floor)                       PREPARATION FOR COLONOSCOPY WITH MOVIPREP   Starting 5 days prior to your procedure  SATURDAY 05/14  do not eat nuts, seeds, popcorn, corn, beans, peas,  salads, or any raw vegetables.  Do not take any fiber supplements (e.g. Metamucil, Citrucel, and Benefiber).  THE DAY BEFORE YOUR PROCEDURE         DATE:  05/18   DAY:  WEDNESDAY  1.  Drink clear liquids the entire day-NO SOLID FOOD  2.  Do not drink anything colored red or purple.  Avoid juices with pulp.  No orange juice.  3.  Drink at least 64 oz. (8 glasses) of fluid/clear liquids during the day to prevent dehydration and help the prep work efficiently.  CLEAR LIQUIDS INCLUDE: Water Jello Ice Popsicles Tea (sugar ok, no milk/cream) Powdered fruit flavored drinks Coffee (sugar ok, no milk/cream) Gatorade Juice: apple, white grape, white cranberry  Lemonade Clear bullion, consomm, broth Carbonated beverages (any kind) Strained chicken noodle soup Hard Candy                             4.  In the morning, mix first dose of MoviPrep solution:    Empty 1 Pouch A and 1 Pouch B into the disposable container    Add lukewarm drinking water to the top line of the container. Mix to dissolve    Refrigerate (mixed solution should be used within 24 hrs)  5.  Begin drinking the prep at 5:00 p.m. The MoviPrep container is divided by 4 marks.   Every 15 minutes drink the solution down to the next mark (approximately 8 oz) until the full liter is complete.   6.  Follow completed prep with 16 oz of clear liquid of your  choice (Nothing red or purple).  Continue to drink clear liquids until bedtime.  7.  Before going to bed, mix second dose of MoviPrep solution:    Empty 1 Pouch A and 1 Pouch B into the disposable container    Add lukewarm drinking water to the top line of the container. Mix to dissolve    Refrigerate  THE DAY OF YOUR PROCEDURE      DATE: 05/19  DAY: THURSDAY  Beginning at  5:00 a.m. (5 hours before procedure):         1. Every 15 minutes, drink the solution down to the next mark (approx 8 oz) until the full liter is complete.  2. Follow completed prep with 16 oz. of clear liquid of your choice.    3. You may drink clear liquids until  8:00am (2 HOURS BEFORE PROCEDURE).   MEDICATION INSTRUCTIONS  Unless otherwise instructed, you should take regular prescription medications with a small sip of water  as early as possible the morning of your procedure.  Additional medication instructions: Do not take HCTZ am of procedure.         OTHER INSTRUCTIONS  You will need a responsible adult at least 68 years of age to accompany you and drive you home.   This person must remain in the waiting room during your procedure.  Wear loose fitting clothing that is easily removed.  Leave jewelry and other valuables at home.  However, you may wish to bring a book to read or  an iPod/MP3 player to listen to music as you wait for your procedure to start.  Remove all body piercing jewelry and leave at home.  Total time from sign-in until discharge is approximately 2-3 hours.  You should go home directly after your procedure and rest.  You can resume normal activities the  day after your procedure.  The day of your procedure you should not:   Drive   Make legal decisions   Operate machinery   Drink alcohol   Return to work  You will receive specific instructions about eating, activities and medications before you leave.    The above instructions have been reviewed and  explained to me by   Ulis Rias RN  Mar 05, 2010 9:54 AM     I fully understand and can verbalize these instructions _____________________________ Date _________

## 2010-12-01 NOTE — Letter (Signed)
Summary: No Show/[facility]  No Show/[facility]   Imported By: Sherian Rein 04/10/2010 08:46:24  _____________________________________________________________________  External Attachment:    Type:   Image     Comment:   External Document

## 2010-12-01 NOTE — Miscellaneous (Signed)
Summary: Lec previsit  Clinical Lists Changes  Medications: Added new medication of MOVIPREP 100 GM  SOLR (PEG-KCL-NACL-NASULF-NA ASC-C) As per prep instructions. - Signed Rx of MOVIPREP 100 GM  SOLR (PEG-KCL-NACL-NASULF-NA ASC-C) As per prep instructions.;  #1 x 0;  Signed;  Entered by: Ulis Rias RN;  Authorized by: Iva Boop MD, FACG;  Method used: [facility] to [facility] [address] [phone]*, [address] [place], [place], [place]  [address], Ph: [phone] or [phone], Fax: [phone]    Prescriptions: MOVIPREP 100 GM  SOLR (PEG-KCL-NACL-NASULF-NA ASC-C) As per prep instructions.  #1 x 0   Entered by:   Ulis Rias RN   Authorized by:   Iva Boop MD, [facility]   Signed by:   Ulis Rias RN on 03/05/2010   Method used:   [facility]ronically to        [facility] [address] [phone]* (retail)       [address]       [place], [place]  [address]       Ph: [phone] or [phone]       Fax: [phone]   RxID:   [id number]

## 2010-12-01 NOTE — Assessment & Plan Note (Signed)
Summary: 2 WK ROV  PER TRIAGE/NWS   Vital Signs:  Patient profile:   70 year old male Height:      68 inches Weight:      180 pounds BMI:     27.47 O2 Sat:      99 % on Room air Temp:     98.3 degrees F oral Pulse rate:   50 / minute BP sitting:   120 / 70  (left arm) Cuff size:   regular  Vitals Entered ByZella Ball Ewing (April 02, 2010 9:03 AM)  O2 Flow:  Room air CC: 2 week ROV/RE   CC:  2 week ROV/RE.  History of Present Illness: unfort could not tolerate the crestor due to severe myalgias, worse in the hips and lower legs; and HCTZ caused immediate ED problems, so had to stop both.  Also with complicated polyp removal with bleeding and anemia - nadir hgg approx 9.  Due to f/u colnoscopy repeat by sept 2011 due to incomplete polyp removal.  has dietary appt but does not plan to go as he already has excellent and tends to over excercise as well. Has been holding off on the aspirin since the bleeding.  Pt denies CP, sob, doe, wheezing, orthopnea, pnd, worsening LE edema, palps, dizziness or syncope  Pt denies new neuro symptoms such as headache, facial or extremity weakness   Problems Prior to Update: 1)  Anemia-nos  (ICD-285.9) 2)  Hyperlipidemia  (ICD-272.4) 3)  Anxiety  (ICD-300.00) 4)  Preventive Health Care  (ICD-V70.0) 5)  Glaucoma  (ICD-365.9) 6)  Hypertension  (ICD-401.9)  Medications Prior to Update: 1)  Hydrochlorothiazide 25 Mg Tabs (Hydrochlorothiazide) .Marland Kitchen.. 1 By Mouth Once Daily 2)  Crestor 20 Mg Tabs (Rosuvastatin Calcium) .Marland Kitchen.. 1 By Mouth Once Daily 3)  Aspir-Low 81 Mg Tbec (Aspirin) .Marland Kitchen.. 1po Once Daily  Current Medications (verified): 1)  Aspir-Low 81 Mg Tbec (Aspirin) .Marland Kitchen.. 1po Once Daily 2)  Simvastatin 40 Mg Tabs (Simvastatin) .Marland Kitchen.. 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   Head:  normocephalic and atraumatic.   Eyes:  vision grossly intact, pupils equal, and pupils round.   Ears:  R ear normal and L ear normal.   Nose:  no external deformity and no nasal discharge.   Mouth:  no gingival abnormalities and pharynx pink and moist.   Neck:  supple and no masses.   Lungs:  normal respiratory effort and normal breath sounds.   Heart:  normal rate and regular rhythm.   Extremities:  no edema, no erythema    Impression & Recommendations:  Problem # 1:  HYPERTENSION (ICD-401.9)  The following medications were removed from the medication list:    Hydrochlorothiazide 25 Mg Tabs (Hydrochlorothiazide) .Marland Kitchen... 1 by mouth once daily with Ed side effects from the diuretic, and BP lower I suspect now off the med with recent bleeding episode;  ok to hold on further BP med today; he check BP at home daiily;  f/u next visit  Problem # 2:  HYPERLIPIDEMIA (ICD-272.4)  The following medications were removed from the medication list:    Crestor 20 Mg Tabs (Rosuvastatin calcium) .Marland Kitchen... 1 by mouth once daily His updated medication list for this problem includes:    Simvastatin 40 Mg Tabs (Simvastatin) .Marland Kitchen... 1 by mouth once daily to change to simvastatin; with f/u labs next visit  Problem # 3:  ANEMIA-NOS (ICD-285.9) post complicated colonosocpy, no further overt blood loss  - ok to re-start the asa dialy, and re-check labs with next blood draw,   Complete Medication List: 1)  Aspir-low 81 Mg Tbec (Aspirin) .Marland Kitchen.. 1po once daily 2)  Simvastatin 40 Mg Tabs (Simvastatin) .Marland Kitchen.. 1 by mouth once  daily  Patient Instructions: 1)  Take an Aspirin every day - 81 mg - a per day - COATED only 2)  stop the fluid pill and the crestor as you have 3)  start the simvastatin 40 mg per day 4)  Please schedule a follow-up appointment in 1 month with : 5)  CBC w/ Diff prior to visit, ICD-9: 285.9 6)  Hepatic Panel prior to visit, ICD-9: v58.69 7)  Lipid Panel prior to visit, ICD-9: 272.0 8)  Check your Blood Pressure regularly.,  Your goal is to less than 140/90 Prescriptions: SIMVASTATIN 40 MG TABS (SIMVASTATIN) 1 by mouth once daily  #90 x 3   Entered and Authorized by:   Corwin Levins MD   Signed by:   Corwin Levins MD on 04/02/2010   Method used:   Print then Give to Patient   RxID:   [id number]

## 2010-12-01 NOTE — Letter (Signed)
Summary: Patient Notice- Polyp Results  [facility]  [address] [place], [place] [address]   Phone: [phone]  Fax: [phone]        Mar 26, 2010 MRN: [id number]    [facility] [address] [place], [place]  [address]    Dear Zachary Hobbs,  The polyp(s) removed from your colon were adenomatous. This means that they were pre-cancerous or that  they had the potential to change into cancer over time.  In order to make sure that the polyp(s) were completely removed, I recommend you have a repeat colonoscopy in 3 months (by September 2011). If you are not contacted by [place] at that time, please call us.  Should you develop new or worsening symptoms of abdominal pain, bowel habit changes or bleeding from the rectum or bowels, please schedule an evaluation with either your primary care physician or with me.  Please call us if you are having persistent problems or have questions about your condition that have not been fully answered at this time.  Sincerely,  Iva Boop MD, [facility]  This letter has been electronically signed by your physician.  Appended Document: Patient Notice- Polyp Results letter mailed.

## 2010-12-01 NOTE — Miscellaneous (Signed)
  Clinical Lists Changes  Problems: Added new problem of HYPERLIPIDEMIA (ICD-272.4) Observations: Added new observation of PAST MED HX: Hypertension Glaucoma arthritis Anxiety Hyperlipidemia  (02/18/2010 13:02) Added new observation of HYPRLIPIDMIA: yes (02/18/2010 13:02)       Past History:  Past Medical History: Hypertension Glaucoma arthritis Anxiety Hyperlipidemia

## 2010-12-01 NOTE — Progress Notes (Signed)
Summary: PROBLEMS WITH PILLS  Phone Note Call from Patient Call back at Home Phone [phone]   Caller: [facility] Summary of Call: BLOOD PRESSURE PILL ARE CAUSING HEADACHES.  LOSARTAN 50 MG.  COZAAR MAKES HIM SLEEP.   [facility] [address] Initial call taken by: Hilarie Fredrickson,  May 18, 2010 8:35 AM  Follow-up for Phone Call        What alternative should the patient try? Please advise. Lucious Groves CMA  May 18, 2010 9:27 AM   Additional Follow-up for Phone Call Additional follow up Details #1::        this medicine does not cause fatigue or sleepiness  please consider ROV to evaluate if he thinks it significant enough to do so Additional Follow-up by: Corwin Levins MD,  May 18, 2010 12:25 PM    Additional Follow-up for Phone Call Additional follow up Details #2::    Patient is adament that this med is too strong and causing his headaches. Headaches are his issue not fatigue and sleepiness. Patient does not wish to come in due to cost. Please advise. Lucious Groves CMA  May 18, 2010 2:43 PM   Additional Follow-up for Phone Call Additional follow up Details #3:: Details for Additional Follow-up Action Taken: I am equally adamant that the blood pressure medicine is not the problem  I decline to do medical treatment I do not think is appropriate  Please let me know if pt wishes to see a new primary care physician, so that I can forward the records Additional Follow-up by: Corwin Levins MD,  May 18, 2010 2:59 PM   Patient notified, offered office visit to discuss and patient declined stating that he would just discuss it at his next office visit already scheduled. Lucious Groves CMA  May 18, 2010 4:40 PM

## 2010-12-01 NOTE — Assessment & Plan Note (Signed)
Summary: hosp. f/u--ch.   History of Present Illness Visit Type: Follow-up Visit Primary GI MD: Stan Head MD [facility] Primary Provider: Oliver Barre, MD Chief Complaint: [facility] f/u polypectomy bleed. Pt doing much better now but still has fatigue.  History of Present Illness:   Losartan is bothering him he thinks and will discuss with Dr. Jonny Hobbs. No GI complaints.    GI Review of Systems      Denies abdominal pain, acid reflux, belching, bloating, chest pain, dysphagia with liquids, dysphagia with solids, heartburn, loss of appetite, nausea, vomiting, vomiting blood, weight loss, and  weight gain.        Denies anal fissure, black tarry stools, change in bowel habit, constipation, diarrhea, diverticulosis, fecal incontinence, heme positive stool, hemorrhoids, irritable bowel syndrome, jaundice, light color stool, liver problems, rectal bleeding, and  rectal pain.    Current Medications (verified): 1)  Aspir-Low 81 Mg Tbec (Aspirin) .Zachary Hobbs.. 1po Once Daily 2)  Simvastatin 40 Mg Tabs (Simvastatin) .Zachary Hobbs.. 1 By Mouth Once Daily 3)  Cozaar 50 Mg Tabs (Losartan Potassium) .... 1/2 Tablet By Mouth Once Daily  Allergies (verified): 1)  * Crestor 2)  * Hctz  Past History:  Past Medical History: Hypertension Glaucoma arthritis Anxiety Hyperlipidemia Anemia-post-colonoscopy - polypectomy hemorrage May 2011 Adenomatous colon polyps May 2011  Past Surgical History: Reviewed history from 02/18/2010 and no changes required. Denies surgical history  Family History: Reviewed history from 02/18/2010 and no changes required. father unkown mother with dementia 2 sister and 1 aunt  with glaucoma  Social History: Reviewed history from 02/18/2010 and no changes required. 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   Impression & Recommendations:  Problem # 1:  ANEMIA, SECONDARY TO ACUTE BLOOD LOSS (ICD-285.1) Assessment Improved recheck CBC in mid august  Problem # 2:  COLONIC POLYPS, ADENOMATOUS (ICD-211.3) Assessment: Unchanged likely has residual cecal polyp that needs closer follow-up - 3-6 mnths fter he would like to do later in year so will recall for October with plans to do in Nov. Will try to sample MoviPerp then if possible  Patient Instructions: 1)  Please go to the basement to have your lab tests drawn on 06/17/10. 2)  We will send you a letter to schedule a repeat colonoscopy around November 2011. 3)  The medication list was reviewed and reconciled.  All changed / newly prescribed medications were explained.  A complete medication list was provided to the patient / caregiver.

## 2010-12-01 NOTE — Procedures (Signed)
Summary: Colonoscopy  Patient: Zachary Hobbs Note: All result statuses are Final unless otherwise noted.  Tests: (1) Colonoscopy (COL)   COL Colonoscopy           DONE     [facility]     [address]     [place], [place]  [address]           COLONOSCOPY PROCEDURE REPORT           PATIENT:  Zachary Hobbs  MR#:  [id number]     BIRTHDATE:  02-Nov-1940, 68 yrs. old  GENDER:  male     ENDOSCOPIST:  Iva Boop, MD, [facility]     REF. BY:  Oliver Barre, M.D.     PROCEDURE DATE:  03/19/2010     PROCEDURE:  Colonoscopy with biopsy and snare polypectomy,     Colonoscopy with submucosal injection     ASA CLASS:  Class II     INDICATIONS:  Routine Risk Screening     MEDICATIONS:   Fentanyl 75 mcg, Versed 6 mg IV           DESCRIPTION OF PROCEDURE:   After the risks benefits and     alternatives of the procedure were thoroughly explained, informed     consent was obtained.  Digital rectal exam was performed and     revealed no abnormalities and normal prostate.   The LB CF-[id number]     [id number] endoscope was introduced through the anus and advanced to     the cecum, which was identified by both the appendix and ileocecal     valve, without limitations.  The quality of the prep was     excellent, using MoviPrep.  The instrument was then slowly     withdrawn as the colon was fully examined.     Insertion: 2:44 minutes Withdrawal: 37:02 minutes     <<PROCEDUREIMAGES>>           FINDINGS:  A diminutive polyp was found in the cecum. It was 2 mm     in size. The polyp was removed using cold biopsy forceps.  A     sessile polyp was found in the cecum. It was large, irregularly     shaped and sessile. It was 2 - 3 cm in size. Submucosal normal     saline injection was used to elevate the polyp. Polyp was snared     piecemeal, then cauterized with monopolar cautery. Retrieval was     successful. Normal saline lift used, after several lifts and     snarings, polyp did not raise further.   Five polyps were found in     the left colon. Splenic flexure, descending (2), sigmoid, and     rectal polyps. Maximum size of 8 mm.Three polyps were snared     without cautery. Retrieval was successful.  Two polyps were     snared, then cauterized with monopolar cautery. Retrieval was     successful. snare polyp  This was otherwise a normal examination     of the colon. including right colon retroflexion.   Retroflexed     views in the rectum revealed no abnormalities.    The scope was     then withdrawn from the patient and the procedure completed.           COMPLICATIONS:  None     ENDOSCOPIC IMPRESSION:     1) 2 - 3 cm sessile polyp in the cecum -  partially removed     2) 2 mm diminutive polyp in the cecum - removed     3) Five polyps in the left colon - removed     4) Otherwise normal examination - excellent prep     RECOMMENDATIONS:     1) No aspirin or NSAID's for 2 weeks     2) Await pathology results     3) Repeat colonoscopic polypectomy with APC available vs.     surgical resection of residual cecal polyp will be needed.     4) He is having significant myalgias in legs with Crestor and I     advised him to hold it. I will let Dr. Jonny Hobbs know.           REPEAT EXAM:  In for Colonoscopy, pending biopsy results.           Iva Boop, MD, [facility]           CC:  Zachary Levins, MD     The Patient           n.     Zachary Hobbs:   Iva Boop at 03/19/2010 11:11 AM           Zachary Hobbs, [id number]  Note: An exclamation mark (!) indicates a result that was not dispersed into the flowsheet. Document Creation Date: 03/19/2010 5:07 PM _______________________________________________________________________  (1) Order result status: Final Collection or observation date-time: 03/19/2010 10:57 Requested date-time:  Receipt date-time:  Reported date-time:  Referring Physician:   Ordering Physician: Stan Head [phone]) Specimen Source:  Source: Launa Grill Order  Number: [id number] Lab site:   Appended Document: previsit     Procedures Next Due Date:    Colonoscopy: 07/2010  Appended Document: Colonoscopy Clinical Lists Changes  Observations: Added new observation of COLONNXTDUE: 07/2010 (05/15/2010 16:37) Added new observation of COLONOSCOPY: Pathology:  Adenomatous polyp.   Location:  [facility].  ENDOSCOPIC IMPRESSION:     1) 2 - 3 cm sessile polyp in the cecum - partially removed     2) 2 mm diminutive polyp in the cecum - removed     3) Five polyps in the left colon - removed     4) Otherwise normal examination - excellent prep     RECOMMENDATIONS:     1) No aspirin or NSAID's for 2 weeks     2) Await pathology results     3) Repeat colonoscopic polypectomy with APC available vs.     surgical resection of residual cecal polyp will be needed.     4) He is having significant myalgias in legs with Crestor and I     advised him to hold it. I will let Dr. Jonny Hobbs know.  ***Microscopic Examination and Diagnosis***   1. COLON, POLYP(S), CECUM : TUBULAR ADENOMA.NO HIGH GRADE DYSPLASIA OR  MALIGNANCY IDENTIFIED (TWO FRAGMENTS).   2. COLON, POLYP(S), CECUM : MULTIPLE FRAGMENTS OF TUBULOVILLOUS ADENOMA.NO HIGH  GRADE DYSPLASIA OR MALIGNANCY.   3. COLON, POLYP(S), SPLENIC FLEXURE, SIGMOID, RECTUM : MULTIPLE FRAGMENTS OF  TUBULAR ADENOMA, NO HIGH GRADE DYSPLASIA OR MALIGNANCY. (03/19/2010 16:41)  Colonoscopy  Procedure date:  03/19/2010  Findings:      Pathology:  Adenomatous polyp.   Location:  [facility].  ENDOSCOPIC IMPRESSION:     1) 2 - 3 cm sessile polyp in the cecum - partially removed     2) 2 mm diminutive polyp in the cecum - removed     3) Five  polyps in the left colon - removed     4) Otherwise normal examination - excellent prep     RECOMMENDATIONS:     1) No aspirin or NSAID's for 2 weeks     2) Await pathology results     3) Repeat colonoscopic polypectomy with APC available vs.     surgical  resection of residual cecal polyp will be needed.     4) He is having significant myalgias in legs with Crestor and I     advised him to hold it. I will let Dr. Jonny Hobbs know.  ***Microscopic Examination and Diagnosis***   1. COLON, POLYP(S), CECUM : TUBULAR ADENOMA.NO HIGH GRADE DYSPLASIA OR  MALIGNANCY IDENTIFIED (TWO FRAGMENTS).   2. COLON, POLYP(S), CECUM : MULTIPLE FRAGMENTS OF TUBULOVILLOUS ADENOMA.NO HIGH  GRADE DYSPLASIA OR MALIGNANCY.   3. COLON, POLYP(S), SPLENIC FLEXURE, SIGMOID, RECTUM : MULTIPLE FRAGMENTS OF  TUBULAR ADENOMA, NO HIGH GRADE DYSPLASIA OR MALIGNANCY.  Procedures Next Due Date:    Colonoscopy: 07/2010

## 2010-12-10 ENCOUNTER — Encounter: Payer: Self-pay | Admitting: Internal Medicine

## 2010-12-10 ENCOUNTER — Ambulatory Visit (INDEPENDENT_AMBULATORY_CARE_PROVIDER_SITE_OTHER): Payer: No Typology Code available for payment source | Admitting: Internal Medicine

## 2010-12-10 DIAGNOSIS — F528 Other sexual dysfunction not due to a substance or known physiological condition: Secondary | ICD-10-CM

## 2010-12-10 DIAGNOSIS — I10 Essential (primary) hypertension: Secondary | ICD-10-CM

## 2010-12-17 NOTE — Assessment & Plan Note (Signed)
Summary: 2 MO ROV/NWS #   Vital Signs:  Patient profile:   70 year old male Height:      67.5 inches Weight:      181 pounds BMI:     28.03 O2 Sat:      98 % on Room air Temp:     98.0 degrees F oral Pulse rate:   64 / minute Pulse rhythm:   regular Resp:     16 per minute BP supine:   140 / 80  (right arm) BP sitting:   140 / 72  (left arm) Cuff size:   large  Vitals Entered By: Rock Nephew CMA (December 10, 2010 10:03 AM)  Nutrition Counseling: Patient's BMI is greater than 25 and therefore counseled on weight management options.  O2 Flow:  Room air CC: follow-up visit Is Patient Diabetic? No Pain Assessment Patient in pain? no       Does patient need assistance? Functional Status Self care Ambulation Normal   Primary Care Provider:  Oliver Barre, MD  CC:  follow-up visit.  History of Present Illness:  Hypertension Follow-Up      This is a 70 year old man who presents for Hypertension follow-up.  The patient reports impotence, but denies lightheadedness, urinary frequency, headaches, edema, rash, and fatigue.  The patient denies the following associated symptoms: chest pain, chest pressure, exercise intolerance, dyspnea, palpitations, syncope, leg edema, and pedal edema.  Compliance with medications (by patient report) has been near 100%.  The patient reports that dietary compliance has been excellent.  The patient reports exercising 3-4X per week.  Adjunctive measures currently used by the patient include salt restriction and relaxation.    Preventive Screening-Counseling & Management  Alcohol-Tobacco     Alcohol drinks/day: 0     Alcohol Counseling: not indicated; patient does not drink     Smoking Status: quit     Tobacco Counseling: not to resume use of tobacco products  Hep-HIV-STD-Contraception     Hepatitis Risk: no risk noted     HIV Risk: no risk noted     STD Risk: no risk noted      Drug Use:  no.    Clinical Review Panels:  Prevention  Last Colonoscopy:  Pathology:  Adenomatous polyp.   Location:  [facility].  ENDOSCOPIC IMPRESSION:     1) 2 - 3 cm sessile polyp in the cecum - partially removed     2) 2 mm diminutive polyp in the cecum - removed     3) Five polyps in the left colon - removed     4) Otherwise normal examination - excellent prep     RECOMMENDATIONS:     1) No aspirin or NSAID's for 2 weeks     2) Await pathology results     3) Repeat colonoscopic polypectomy with APC available vs.     surgical resection of residual cecal polyp will be needed.     4) He is having significant myalgias in legs with Crestor and I     advised him to hold it. I will let Dr. Jonny Ruiz know.  ***Microscopic Examination and Diagnosis***   1. COLON, POLYP(S), CECUM : TUBULAR ADENOMA.NO HIGH GRADE DYSPLASIA OR  MALIGNANCY IDENTIFIED (TWO FRAGMENTS).   2. COLON, POLYP(S), CECUM : MULTIPLE FRAGMENTS OF TUBULOVILLOUS ADENOMA.NO HIGH  GRADE DYSPLASIA OR MALIGNANCY.   3. COLON, POLYP(S), SPLENIC FLEXURE, SIGMOID, RECTUM : MULTIPLE FRAGMENTS OF  TUBULAR ADENOMA, NO HIGH GRADE DYSPLASIA OR MALIGNANCY. (03/19/2010)  Last PSA:  2.31 (02/18/2010)  Lipid Management   Cholesterol:  218 (05/06/2010)   HDL (good cholesterol):  43.10 (05/06/2010)  Diabetes Management   Creatinine:  1.1 (10/08/2010)  CBC   WBC:  8.8 (10/08/2010)   RBC:  5.11 (10/08/2010)   Hgb:  13.6 (10/08/2010)   Hct:  40.5 (10/08/2010)   Platelets:  256.0 (10/08/2010)   MCV  79.3 (10/08/2010)   MCHC  33.6 (10/08/2010)   RDW  16.0 (10/08/2010)   PMN:  66.7 (10/08/2010)   Lymphs:  18.3 (10/08/2010)   Monos:  11.1 (10/08/2010)   Eosinophils:  3.4 (10/08/2010)   Basophil:  0.5 (10/08/2010)  Complete Metabolic Panel   Glucose:  76 (10/08/2010)   Sodium:  141 (10/08/2010)   Potassium:  4.3 (10/08/2010)   Chloride:  107 (10/08/2010)   CO2:  27 (10/08/2010)   BUN:  23 (10/08/2010)   Creatinine:  1.1 (10/08/2010)   Albumin:  4.1 (05/06/2010)   Total  Protein:  7.2 (05/06/2010)   Calcium:  9.1 (10/08/2010)   Total Bili:  0.5 (05/06/2010)   Alk Phos:  98 (05/06/2010)   SGPT (ALT):  27 (05/06/2010)   SGOT (AST):  19 (05/06/2010)   Medications Prior to Update: 1)  Aspir-Low 81 Mg Tbec (Aspirin) .Marland Kitchen.. 1po Once Daily 2)  Simvastatin 40 Mg Tabs (Simvastatin) .Marland Kitchen.. 1 By Mouth Once Daily 3)  Diovan Hct 320-12.5 Mg Tabs (Valsartan-Hydrochlorothiazide) .... One By Mouth Once Daily For High Blood Pressure  Current Medications (verified): 1)  Aspir-Low 81 Mg Tbec (Aspirin) .Marland Kitchen.. 1po Once Daily 2)  Simvastatin 40 Mg Tabs (Simvastatin) .Marland Kitchen.. 1 By Mouth Once Daily 3)  Diovan Hct 320-12.5 Mg Tabs (Valsartan-Hydrochlorothiazide) .... One By Mouth Once Daily For High Blood Pressure 4)  Lumigan 0.01 % Soln (Bimatoprost) .... 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 father unkown mother with dementia 2 sister and 1 aunt  with glaucoma  Social History: Reviewed history from 05/18/2010 and no changes required. 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  Physical Exam  General:  alert, well-developed, well-nourished, well-hydrated, appropriate dress, normal appearance, healthy-appearing, and cooperative to examination.   Mouth:  Oral mucosa and oropharynx without lesions or exudates.  Teeth in good repair. Neck:  supple, full ROM, and no masses.   Lungs:  normal respiratory effort, no intercostal retractions, no accessory muscle use, normal breath sounds, no dullness, no fremitus, no crackles, and no wheezes.   Heart:  normal rate, regular rhythm, no murmur, no gallop, no rub, and no JVD.   Abdomen:  soft, non-tender, normal bowel sounds, no  distention, no masses, no guarding, no rigidity, no rebound tenderness, no abdominal hernia, no inguinal hernia, no hepatomegaly, and no splenomegaly.   Msk:  normal ROM, no joint tenderness, no joint swelling, no joint warmth, no redness over joints, no joint deformities, no joint instability, and no crepitation.   Pulses:  R and L carotid,radial,femoral,dorsalis pedis and posterior tibial pulses are full and equal bilaterally Extremities:  No clubbing, cyanosis, edema, or deformity noted with normal full range of motion of all joints.   Neurologic:  No cranial nerve deficits noted. Station and gait are normal. Plantar reflexes are down-going bilaterally. DTRs are symmetrical throughout. Sensory, motor and coordinative functions appear intact. Skin:  turgor normal, color normal, no rashes, no suspicious lesions, no ecchymoses, no petechiae, no purpura, no ulcerations, and no edema.   Psych:  Cognition and judgment appear intact. Alert and cooperative with normal attention span and concentration. No apparent delusions, illusions, hallucinations   Impression & Recommendations:  Problem # 1:  ERECTILE DYSFUNCTION (ICD-302.72) Assessment  New  His updated medication list for this problem includes:    Staxyn 10 Mg Tbdp (Vardenafil hcl) .Marland Kitchen... Take one as directed  Discussed proper use of medications, as well as side effects.   Problem # 2:  HYPERTENSION (ICD-401.9) Assessment: Improved  His updated medication list for this problem includes:    Diovan Hct 320-12.5 Mg Tabs (Valsartan-hydrochlorothiazide) ..... One by mouth once daily for high blood pressure  BP today: 140/72 Prior BP: 180/90 (10/08/2010)  Prior 10 Yr Risk Heart Disease: Not enough information (10/08/2010)  Labs Reviewed: K+: 4.3 (10/08/2010) Creat: : 1.1 (10/08/2010)   Chol: 218 (05/06/2010)   HDL: 43.10 (05/06/2010)   TG: 87.0 (05/06/2010)  Complete Medication List: 1)  Aspir-low 81 Mg Tbec (Aspirin) .Marland Kitchen.. 1po once daily 2)  Simvastatin 40 Mg Tabs (Simvastatin) .Marland Kitchen.. 1 by mouth once daily 3)  Diovan Hct 320-12.5 Mg Tabs (Valsartan-hydrochlorothiazide) .... One by mouth once daily for high blood pressure 4)  Lumigan 0.01 % Soln (Bimatoprost) .... As directed 5)  Staxyn 10 Mg Tbdp (Vardenafil hcl) .... Take one as directed  Patient Instructions: 1)  Please schedule a follow-up appointment in 4 months. 2)  It is important that you exercise regularly at least 20 minutes 5 times a week. If you develop chest pain, have severe difficulty breathing, or feel very tired , stop exercising immediately and seek medical attention. 3)  If you could be exposed to sexually transmitted diseases, you should use a condom. 4)  Check your Blood Pressure regularly. If it is above 140/90: you should make an appointment. Prescriptions: STAXYN 10 MG TBDP (VARDENAFIL HCL) Take one as directed  #12 x 0   Entered and Authorized by:   Etta Grandchild MD   Signed by:   Etta Grandchild MD on 12/10/2010   Method used:   Samples Given   RxID:   [id number]    Orders Added: 1)  Est. Patient Level IV [56213]

## 2011-01-18 LAB — DIFFERENTIAL
Basophils Absolute: 0 10*3/uL (ref 0.0–0.1)
Basophils Relative: 0 % (ref 0–1)
Eosinophils Absolute: 0.1 10*3/uL (ref 0.0–0.7)
Lymphocytes Relative: 10 % — ABNORMAL LOW (ref 12–46)
Lymphocytes Relative: 13 % (ref 12–46)
Lymphs Abs: 1.1 10*3/uL (ref 0.7–4.0)
Monocytes Relative: 5 % (ref 3–12)
Monocytes Relative: 7 % (ref 3–12)
Neutro Abs: 7.2 10*3/uL (ref 1.7–7.7)
Neutro Abs: 8 10*3/uL — ABNORMAL HIGH (ref 1.7–7.7)
Neutrophils Relative %: 78 % — ABNORMAL HIGH (ref 43–77)
Neutrophils Relative %: 84 % — ABNORMAL HIGH (ref 43–77)

## 2011-01-18 LAB — CROSSMATCH: Antibody Screen: NEGATIVE

## 2011-01-18 LAB — CBC
HCT: 34.2 % — ABNORMAL LOW (ref 39.0–52.0)
Hemoglobin: 11.1 g/dL — ABNORMAL LOW (ref 13.0–17.0)
Hemoglobin: 8.6 g/dL — ABNORMAL LOW (ref 13.0–17.0)
MCHC: 32.6 g/dL (ref 30.0–36.0)
Platelets: 261 10*3/uL (ref 150–400)
RBC: 3.28 MIL/uL — ABNORMAL LOW (ref 4.22–5.81)
RBC: 3.96 MIL/uL — ABNORMAL LOW (ref 4.22–5.81)
RDW: 14.9 % (ref 11.5–15.5)
WBC: 5.4 10*3/uL (ref 4.0–10.5)

## 2011-01-18 LAB — BASIC METABOLIC PANEL
BUN: 17 mg/dL (ref 6–23)
Chloride: 108 mEq/L (ref 96–112)
Glucose, Bld: 118 mg/dL — ABNORMAL HIGH (ref 70–99)
Potassium: 4.2 mEq/L (ref 3.5–5.1)

## 2011-01-18 LAB — HEMOGLOBIN AND HEMATOCRIT, BLOOD
HCT: 26.2 % — ABNORMAL LOW (ref 39.0–52.0)
Hemoglobin: 8.5 g/dL — ABNORMAL LOW (ref 13.0–17.0)
Hemoglobin: 8.9 g/dL — ABNORMAL LOW (ref 13.0–17.0)

## 2011-01-18 LAB — HEMOCCULT GUIAC POC 1CARD (OFFICE): Fecal Occult Bld: POSITIVE

## 2011-01-18 LAB — SAMPLE TO BLOOD BANK

## 2011-01-20 LAB — POCT I-STAT, CHEM 8
BUN: 17 mg/dL (ref 6–23)
Chloride: 106 mEq/L (ref 96–112)
Creatinine, Ser: 1.2 mg/dL (ref 0.4–1.5)
Glucose, Bld: 94 mg/dL (ref 70–99)
HCT: 44 % (ref 39.0–52.0)
Potassium: 4 mEq/L (ref 3.5–5.1)

## 2011-01-26 ENCOUNTER — Encounter (HOSPITAL_COMMUNITY): Payer: Self-pay | Admitting: Radiology

## 2011-01-26 ENCOUNTER — Inpatient Hospital Stay (INDEPENDENT_AMBULATORY_CARE_PROVIDER_SITE_OTHER)
Admission: RE | Admit: 2011-01-26 | Discharge: 2011-01-26 | Disposition: A | Discharge status: Home / Self Care | Payer: No Typology Code available for payment source | Source: Ambulatory Visit | Attending: Family Medicine | Admitting: Family Medicine

## 2011-01-26 ENCOUNTER — Emergency Department (HOSPITAL_COMMUNITY)
Admission: EM | Admit: 2011-01-26 | Discharge: 2011-01-26 | Disposition: A | Discharge status: Home / Self Care | Payer: No Typology Code available for payment source | Attending: Emergency Medicine | Admitting: Emergency Medicine

## 2011-01-26 ENCOUNTER — Emergency Department (HOSPITAL_COMMUNITY): Payer: No Typology Code available for payment source

## 2011-01-26 DIAGNOSIS — R42 Dizziness and giddiness: Secondary | ICD-10-CM | POA: Insufficient documentation

## 2011-01-26 DIAGNOSIS — E7800 Pure hypercholesterolemia, unspecified: Secondary | ICD-10-CM | POA: Insufficient documentation

## 2011-01-26 DIAGNOSIS — Z79899 Other long term (current) drug therapy: Secondary | ICD-10-CM | POA: Insufficient documentation

## 2011-01-26 DIAGNOSIS — H409 Unspecified glaucoma: Secondary | ICD-10-CM | POA: Insufficient documentation

## 2011-01-26 DIAGNOSIS — I10 Essential (primary) hypertension: Secondary | ICD-10-CM | POA: Insufficient documentation

## 2011-01-26 DIAGNOSIS — Z7982 Long term (current) use of aspirin: Secondary | ICD-10-CM | POA: Insufficient documentation

## 2011-01-26 DIAGNOSIS — R51 Headache: Secondary | ICD-10-CM

## 2011-01-26 HISTORY — DX: Essential (primary) hypertension: I10

## 2011-01-26 LAB — POCT I-STAT, CHEM 8
BUN: 21 mg/dL (ref 6–23)
Calcium, Ion: 1.05 mmol/L — ABNORMAL LOW (ref 1.12–1.32)
Chloride: 100 mEq/L (ref 96–112)
Glucose, Bld: 132 mg/dL — ABNORMAL HIGH (ref 70–99)

## 2011-04-04 ENCOUNTER — Other Ambulatory Visit: Payer: Self-pay | Admitting: Internal Medicine

## 2011-06-29 ENCOUNTER — Other Ambulatory Visit: Payer: Self-pay | Admitting: *Deleted

## 2011-06-29 MED ORDER — VALSARTAN-HYDROCHLOROTHIAZIDE 320-12.5 MG PO TABS: 1.0000 | ORAL_TABLET | Freq: Every day | ORAL | Status: DC

## 2012-02-12 ENCOUNTER — Emergency Department (HOSPITAL_COMMUNITY): Payer: Medicare HMO

## 2012-02-12 ENCOUNTER — Inpatient Hospital Stay (HOSPITAL_COMMUNITY)
Admission: EM | Admit: 2012-02-12 | Discharge: 2012-02-15 | DRG: 133 | Disposition: A | Discharge status: Home / Self Care | Payer: Medicare HMO | Attending: Critical Care Medicine | Admitting: Critical Care Medicine

## 2012-02-12 ENCOUNTER — Encounter (HOSPITAL_COMMUNITY)
Admission: EM | Disposition: A | Discharge status: Home / Self Care | Payer: Self-pay | Source: Home / Self Care | Attending: Critical Care Medicine

## 2012-02-12 ENCOUNTER — Encounter (HOSPITAL_COMMUNITY): Payer: Self-pay | Admitting: Critical Care Medicine

## 2012-02-12 ENCOUNTER — Inpatient Hospital Stay (HOSPITAL_COMMUNITY): Payer: Medicare HMO

## 2012-02-12 ENCOUNTER — Encounter (HOSPITAL_COMMUNITY): Payer: Self-pay | Admitting: Anesthesiology

## 2012-02-12 ENCOUNTER — Emergency Department (HOSPITAL_COMMUNITY): Payer: Medicare HMO | Admitting: Anesthesiology

## 2012-02-12 ENCOUNTER — Encounter (HOSPITAL_COMMUNITY): Payer: Self-pay | Admitting: Emergency Medicine

## 2012-02-12 DIAGNOSIS — R739 Hyperglycemia, unspecified: Secondary | ICD-10-CM

## 2012-02-12 DIAGNOSIS — J36 Peritonsillar abscess: Principal | ICD-10-CM

## 2012-02-12 DIAGNOSIS — J9600 Acute respiratory failure, unspecified whether with hypoxia or hypercapnia: Secondary | ICD-10-CM

## 2012-02-12 DIAGNOSIS — Z7982 Long term (current) use of aspirin: Secondary | ICD-10-CM

## 2012-02-12 DIAGNOSIS — D62 Acute posthemorrhagic anemia: Secondary | ICD-10-CM

## 2012-02-12 DIAGNOSIS — F411 Generalized anxiety disorder: Secondary | ICD-10-CM

## 2012-02-12 DIAGNOSIS — R7309 Other abnormal glucose: Secondary | ICD-10-CM | POA: Diagnosis present

## 2012-02-12 DIAGNOSIS — I10 Essential (primary) hypertension: Secondary | ICD-10-CM | POA: Diagnosis present

## 2012-02-12 DIAGNOSIS — R0902 Hypoxemia: Secondary | ICD-10-CM | POA: Diagnosis present

## 2012-02-12 DIAGNOSIS — J9601 Acute respiratory failure with hypoxia: Secondary | ICD-10-CM | POA: Diagnosis present

## 2012-02-12 DIAGNOSIS — Z79899 Other long term (current) drug therapy: Secondary | ICD-10-CM

## 2012-02-12 DIAGNOSIS — R259 Unspecified abnormal involuntary movements: Secondary | ICD-10-CM | POA: Diagnosis not present

## 2012-02-12 LAB — MRSA PCR SCREENING: MRSA by PCR: NEGATIVE

## 2012-02-12 LAB — BASIC METABOLIC PANEL
BUN: 26 mg/dL — ABNORMAL HIGH (ref 6–23)
CO2: 26 mEq/L (ref 19–32)
Calcium: 9.1 mg/dL (ref 8.4–10.5)
Creatinine, Ser: 1.28 mg/dL (ref 0.50–1.35)
Glucose, Bld: 117 mg/dL — ABNORMAL HIGH (ref 70–99)

## 2012-02-12 LAB — DIFFERENTIAL
Basophils Absolute: 0 10*3/uL (ref 0.0–0.1)
Basophils Relative: 0 % (ref 0–1)
Eosinophils Absolute: 0 10*3/uL (ref 0.0–0.7)
Eosinophils Relative: 0 % (ref 0–5)
Lymphocytes Relative: 8 % — ABNORMAL LOW (ref 12–46)
Lymphs Abs: 1.2 10*3/uL (ref 0.7–4.0)
Lymphs Abs: 0.4 10*3/uL — ABNORMAL LOW (ref 0.7–4.0)
Monocytes Absolute: 1.6 10*3/uL — ABNORMAL HIGH (ref 0.1–1.0)
Monocytes Relative: 2 % — ABNORMAL LOW (ref 3–12)
Neutro Abs: 11.4 10*3/uL — ABNORMAL HIGH (ref 1.7–7.7)
Neutrophils Relative %: 94 % — ABNORMAL HIGH (ref 43–77)

## 2012-02-12 LAB — CBC
HCT: 41.9 % (ref 39.0–52.0)
MCH: 25.6 pg — ABNORMAL LOW (ref 26.0–34.0)
MCV: 77.7 fL — ABNORMAL LOW (ref 78.0–100.0)
MCV: 78.7 fL (ref 78.0–100.0)
Platelets: 204 10*3/uL (ref 150–400)
RDW: 13.9 % (ref 11.5–15.5)
RDW: 13.9 % (ref 11.5–15.5)
WBC: 15.2 10*3/uL — ABNORMAL HIGH (ref 4.0–10.5)
WBC: 12.1 10*3/uL — ABNORMAL HIGH (ref 4.0–10.5)

## 2012-02-12 LAB — COMPREHENSIVE METABOLIC PANEL
Alkaline Phosphatase: 89 U/L (ref 39–117)
BUN: 26 mg/dL — ABNORMAL HIGH (ref 6–23)
Creatinine, Ser: 1.24 mg/dL (ref 0.50–1.35)
GFR calc Af Amer: 67 mL/min — ABNORMAL LOW (ref >90)
Glucose, Bld: 165 mg/dL — ABNORMAL HIGH (ref 70–99)
Potassium: 3.6 mEq/L (ref 3.5–5.1)
Total Protein: 6.4 g/dL (ref 6.0–8.3)

## 2012-02-12 LAB — PROCALCITONIN: Procalcitonin: 10.93 ng/mL

## 2012-02-12 LAB — GLUCOSE, CAPILLARY
Glucose-Capillary: 155 mg/dL — ABNORMAL HIGH (ref 70–99)
Glucose-Capillary: 154 mg/dL — ABNORMAL HIGH (ref 70–99)

## 2012-02-12 LAB — LACTIC ACID, PLASMA: Lactic Acid, Venous: 2.2 mmol/L (ref 0.5–2.2)

## 2012-02-12 SURGERY — INCISION AND DRAINAGE, ABSCESS
Anesthesia: General | Laterality: Left | Wound class: Dirty or Infected

## 2012-02-12 MED ORDER — BIOTENE DRY MOUTH MT LIQD
15.0000 mL | Freq: Four times a day (QID) | OROMUCOSAL | Status: DC
  Administered 2012-02-12 – 2012-02-15 (×9): 15 mL via OROMUCOSAL

## 2012-02-12 MED ORDER — ALBUTEROL SULFATE (5 MG/ML) 0.5% IN NEBU
5.0000 mg | INHALATION_SOLUTION | Freq: Once | RESPIRATORY_TRACT | Status: AC
  Administered 2012-02-12: 5 mg via RESPIRATORY_TRACT

## 2012-02-12 MED ORDER — SUCCINYLCHOLINE CHLORIDE 20 MG/ML IJ SOLN
INTRAMUSCULAR | Status: DC | PRN
  Administered 2012-02-12: 100 mg via INTRAVENOUS

## 2012-02-12 MED ORDER — PROPOFOL 10 MG/ML IV EMUL
5.0000 ug/kg/min | INTRAVENOUS | Status: DC
  Administered 2012-02-12: 31 ug/kg/min via INTRAVENOUS
  Administered 2012-02-12: 34.979 ug/kg/min via INTRAVENOUS
  Administered 2012-02-13: 25 ug/kg/min via INTRAVENOUS
  Filled 2012-02-12 (×3): qty 100

## 2012-02-12 MED ORDER — SODIUM CHLORIDE 0.9 % IV BOLUS (SEPSIS)
1000.0000 mL | Freq: Once | INTRAVENOUS | Status: AC
  Administered 2012-02-12: 1000 mL via INTRAVENOUS

## 2012-02-12 MED ORDER — DEXTROSE 10 % IV SOLN: INTRAVENOUS | Status: DC

## 2012-02-12 MED ORDER — FENTANYL CITRATE 0.05 MG/ML IJ SOLN
INTRAMUSCULAR | Status: DC | PRN
  Administered 2012-02-12: 150 ug via INTRAVENOUS
  Administered 2012-02-12: 100 ug via INTRAVENOUS

## 2012-02-12 MED ORDER — SODIUM CHLORIDE 0.9 % IV SOLN
3.0000 g | Freq: Once | INTRAVENOUS | Status: AC
  Administered 2012-02-12: 3 g via INTRAVENOUS
  Filled 2012-02-12: qty 3

## 2012-02-12 MED ORDER — SODIUM CHLORIDE 0.9 % IV SOLN
INTRAVENOUS | Status: DC
  Administered 2012-02-12: 14:00:00 via INTRAVENOUS

## 2012-02-12 MED ORDER — METHYLPREDNISOLONE SODIUM SUCC 125 MG IJ SOLR
125.0000 mg | Freq: Once | INTRAMUSCULAR | Status: AC
  Administered 2012-02-12: 125 mg via INTRAVENOUS

## 2012-02-12 MED ORDER — LACTATED RINGERS IV SOLN
INTRAVENOUS | Status: DC | PRN
  Administered 2012-02-12: 12:00:00 via INTRAVENOUS

## 2012-02-12 MED ORDER — 0.9 % SODIUM CHLORIDE (POUR BTL) OPTIME
TOPICAL | Status: DC | PRN
  Administered 2012-02-12: 1000 mL

## 2012-02-12 MED ORDER — ALBUTEROL SULFATE (5 MG/ML) 0.5% IN NEBU
INHALATION_SOLUTION | RESPIRATORY_TRACT | Status: AC
  Filled 2012-02-12: qty 1

## 2012-02-12 MED ORDER — DIPHENHYDRAMINE HCL 50 MG/ML IJ SOLN
50.0000 mg | Freq: Once | INTRAMUSCULAR | Status: AC
  Administered 2012-02-12: 50 mg via INTRAVENOUS
  Filled 2012-02-12: qty 1

## 2012-02-12 MED ORDER — IOHEXOL 300 MG/ML  SOLN
75.0000 mL | Freq: Once | INTRAMUSCULAR | Status: AC | PRN
  Administered 2012-02-12: 75 mL via INTRAVENOUS

## 2012-02-12 MED ORDER — MIDAZOLAM HCL 5 MG/5ML IJ SOLN
INTRAMUSCULAR | Status: DC | PRN
  Administered 2012-02-12 (×2): 2 mg via INTRAVENOUS

## 2012-02-12 MED ORDER — INSULIN ASPART 100 UNIT/ML ~~LOC~~ SOLN: 0.0000 [IU] | SUBCUTANEOUS | Status: DC

## 2012-02-12 MED ORDER — CHLORHEXIDINE GLUCONATE 0.12 % MT SOLN
OROMUCOSAL | Status: AC
  Administered 2012-02-12: 15 mL via OROMUCOSAL
  Filled 2012-02-12: qty 15

## 2012-02-12 MED ORDER — CHLORHEXIDINE GLUCONATE 0.12 % MT SOLN
15.0000 mL | Freq: Two times a day (BID) | OROMUCOSAL | Status: DC
  Administered 2012-02-12 – 2012-02-15 (×6): 15 mL via OROMUCOSAL
  Filled 2012-02-12 (×7): qty 15

## 2012-02-12 MED ORDER — SODIUM CHLORIDE 0.9 % IV SOLN
3.0000 g | Freq: Four times a day (QID) | INTRAVENOUS | Status: DC
  Administered 2012-02-12 – 2012-02-14 (×7): 3 g via INTRAVENOUS
  Filled 2012-02-12 (×11): qty 3

## 2012-02-12 MED ORDER — FENTANYL CITRATE 0.05 MG/ML IJ SOLN
50.0000 ug | INTRAMUSCULAR | Status: DC | PRN
  Administered 2012-02-12 – 2012-02-13 (×3): 50 ug via INTRAVENOUS
  Administered 2012-02-13: 100 ug via INTRAVENOUS
  Filled 2012-02-12 (×3): qty 2

## 2012-02-12 MED ORDER — PROPOFOL 10 MG/ML IV EMUL
INTRAVENOUS | Status: DC | PRN
  Administered 2012-02-12: 200 mg via INTRAVENOUS

## 2012-02-12 MED ORDER — PANTOPRAZOLE SODIUM 40 MG IV SOLR
40.0000 mg | Freq: Every day | INTRAVENOUS | Status: DC
  Administered 2012-02-12 – 2012-02-14 (×3): 40 mg via INTRAVENOUS
  Filled 2012-02-12 (×5): qty 40

## 2012-02-12 MED ORDER — ALBUTEROL SULFATE (5 MG/ML) 0.5% IN NEBU
5.0000 mg | INHALATION_SOLUTION | Freq: Once | RESPIRATORY_TRACT | Status: AC
  Administered 2012-02-12: 5 mg via RESPIRATORY_TRACT
  Filled 2012-02-12: qty 1

## 2012-02-12 MED ORDER — HYDROMORPHONE HCL PF 1 MG/ML IJ SOLN: 0.2500 mg | INTRAMUSCULAR | Status: DC | PRN

## 2012-02-12 MED ORDER — METHYLPREDNISOLONE SODIUM SUCC 125 MG IJ SOLR
INTRAMUSCULAR | Status: AC
  Administered 2012-02-12: 125 mg via INTRAVENOUS
  Filled 2012-02-12: qty 2

## 2012-02-12 SURGICAL SUPPLY — 42 items
ATTRACTOMAT 16X20 MAGNETIC DRP (DRAPES) IMPLANT
BLADE SURG 15 STRL LF DISP TIS (BLADE) ×1 IMPLANT
BLADE SURG 15 STRL SS (BLADE) ×2
CANISTER SUCTION 2500CC (MISCELLANEOUS) ×2 IMPLANT
CLEANER TIP ELECTROSURG 2X2 (MISCELLANEOUS) ×2 IMPLANT
CLOTH BEACON ORANGE TIMEOUT ST (SAFETY) ×2 IMPLANT
CONT SPEC 4OZ CLIKSEAL STRL BL (MISCELLANEOUS) ×2 IMPLANT
COVER SURGICAL LIGHT HANDLE (MISCELLANEOUS) IMPLANT
CRADLE DONUT ADULT HEAD (MISCELLANEOUS) IMPLANT
DRAIN CHANNEL 15F RND FF W/TCR (WOUND CARE) IMPLANT
DRAIN PENROSE 1/2X12 LTX STRL (WOUND CARE) IMPLANT
DRAPE INCISE 13X13 STRL (DRAPES) IMPLANT
ELECT COATED BLADE 2.86 ST (ELECTRODE) ×2 IMPLANT
ELECT REM PT RETURN 9FT ADLT (ELECTROSURGICAL) ×2
ELECTRODE REM PT RTRN 9FT ADLT (ELECTROSURGICAL) ×1 IMPLANT
EVACUATOR SILICONE 100CC (DRAIN) IMPLANT
GAUZE SPONGE 4X4 16PLY XRAY LF (GAUZE/BANDAGES/DRESSINGS) ×2 IMPLANT
GLOVE SS BIOGEL STRL SZ 7.5 (GLOVE) ×1 IMPLANT
GLOVE SUPERSENSE BIOGEL SZ 7.5 (GLOVE) ×1
GOWN STRL NON-REIN LRG LVL3 (GOWN DISPOSABLE) ×4 IMPLANT
KIT BASIN OR (CUSTOM PROCEDURE TRAY) ×2 IMPLANT
KIT ROOM TURNOVER OR (KITS) ×2 IMPLANT
NS IRRIG 1000ML POUR BTL (IV SOLUTION) ×2 IMPLANT
PAD ARMBOARD 7.5X6 YLW CONV (MISCELLANEOUS) ×4 IMPLANT
PENCIL FOOT CONTROL (ELECTRODE) ×2 IMPLANT
SOLUTION BETADINE 4OZ (MISCELLANEOUS) IMPLANT
SPONGE GAUZE 4X4 12PLY (GAUZE/BANDAGES/DRESSINGS) ×2 IMPLANT
SPONGE INTESTINAL PEANUT (DISPOSABLE) IMPLANT
STAPLER VISISTAT 35W (STAPLE) ×2 IMPLANT
SUCTION FRAZIER TIP 8 FR DISP (SUCTIONS)
SUCTION TUBE FRAZIER 8FR DISP (SUCTIONS) IMPLANT
SUT CHROMIC 4 0 PS 2 18 (SUTURE) IMPLANT
SUT ETHILON 3 0 PS 1 (SUTURE) IMPLANT
SUT ETHILON 5 0 P 3 18 (SUTURE)
SUT NYLON ETHILON 5-0 P-3 1X18 (SUTURE) IMPLANT
SUT SILK 2 0 FS (SUTURE) IMPLANT
TAPE CLOTH SURG 4X10 WHT LF (GAUZE/BANDAGES/DRESSINGS) IMPLANT
TOWEL OR 17X24 6PK STRL BLUE (TOWEL DISPOSABLE) ×2 IMPLANT
TOWEL OR 17X26 10 PK STRL BLUE (TOWEL DISPOSABLE) ×2 IMPLANT
TRAY ENT MC OR (CUSTOM PROCEDURE TRAY) ×2 IMPLANT
WATER STERILE IRR 1000ML POUR (IV SOLUTION) ×2 IMPLANT
YANKAUER SUCT BULB TIP NO VENT (SUCTIONS) IMPLANT

## 2012-02-12 NOTE — ED Notes (Signed)
Jearld Fenton, MD on call for ENT; paged to [facility], MD

## 2012-02-12 NOTE — Consult Note (Signed)
Reason for Consult: (Tonsillar abscess Referring Physician: Emergency room  Zachary Hobbs is an 71 y.o. male.  HPI: 71 year old who's had a 2 week course of sore throat. He states over the last 5 days it has become significantly more tender on the left side. He's not previously had tonsillitis issues. He is had very poor by mouth intake over the last several days. He's not had any treatment with antibiotics as of yet prior to admission to the hospital. He is having difficulty with feeling short of breath. Intermittently he has some stridor. He is still able to swallow his own secretions mostly but does spit occasionally. Has no range of motion of his neck issues. He has not had any other upper respiratory infection symptoms prior to this onset.  Past Medical History  Diagnosis Date  . Hypertension     Past Surgical History  Procedure Date  . Colonoscopy     History reviewed. No pertinent family history.  Social History:  reports that he has never smoked. He does not have any smokeless tobacco history on file. He reports that he does not drink alcohol or use illicit drugs.  Allergies:  Allergies  Allergen Reactions  . Rosuvastatin     REACTION: myalgia    Medications: I have reviewed the patient's current medications.  Results for orders placed during the hospital encounter of 02/12/12 (from the past 48 hour(s))  CBC     Status: Abnormal   Collection Time   02/12/12  7:54 AM      Component Value Range Comment   WBC 15.2 (*) 4.0 - 10.5 (K/uL)    RBC 5.39  4.22 - 5.81 (MIL/uL)    Hemoglobin 14.1  13.0 - 17.0 (g/dL)    HCT 16.1  09.6 - 04.5 (%)    MCV 77.7 (*) 78.0 - 100.0 (fL)    MCH 26.2  26.0 - 34.0 (pg)    MCHC 33.7  30.0 - 36.0 (g/dL)    RDW 40.9  81.1 - 91.4 (%)    Platelets 243  150 - 400 (K/uL)   DIFFERENTIAL     Status: Abnormal   Collection Time   02/12/12  7:54 AM      Component Value Range Comment   Neutrophils Relative 81 (*) 43 - 77 (%)    Neutro Abs 12.3 (*)  1.7 - 7.7 (K/uL)    Lymphocytes Relative 8 (*) 12 - 46 (%)    Lymphs Abs 1.2  0.7 - 4.0 (K/uL)    Monocytes Relative 11  3 - 12 (%)    Monocytes Absolute 1.6 (*) 0.1 - 1.0 (K/uL)    Eosinophils Relative 0  0 - 5 (%)    Eosinophils Absolute 0.1  0.0 - 0.7 (K/uL)    Basophils Relative 0  0 - 1 (%)    Basophils Absolute 0.0  0.0 - 0.1 (K/uL)   BASIC METABOLIC PANEL     Status: Abnormal   Collection Time   02/12/12  7:54 AM      Component Value Range Comment   Sodium 140  135 - 145 (mEq/L)    Potassium 3.6  3.5 - 5.1 (mEq/L)    Chloride 101  96 - 112 (mEq/L)    CO2 26  19 - 32 (mEq/L)    Glucose, Bld 117 (*) 70 - 99 (mg/dL)    BUN 26 (*) 6 - 23 (mg/dL)    Creatinine, Ser 7.82  0.50 - 1.35 (mg/dL)    Calcium  9.1  8.4 - 10.5 (mg/dL)    GFR calc non Af Amer 56 (*) >90 (mL/min)    GFR calc Af Amer 65 (*) >90 (mL/min)     Ct Soft Tissue Neck W Contrast  02/12/2012  *RADIOLOGY REPORT*  Clinical Data: Wheezing.  Trismus.  Cough and sore throat.  CT NECK WITH CONTRAST  Technique:  Multidetector CT imaging of the neck was performed with intravenous contrast.  Contrast: 75mL OMNIPAQUE IOHEXOL 300 MG/ML  SOLN  Comparison: None.  Findings: A somewhat irregular peripherally enhancing hypodense collection in or adjacent to the left palatine tonsil has maximal dimensions of 2.5 x 1.9 x 1.8 cm, concerning for developing left peritonsillar abscess.  There is marked asymmetric soft tissue the soft tissue swelling about the left tonsil with some effacement of the airway on the left.  The airway is patent.  The lateral parapharyngeal fat is clear.  No significant lateral pharyngeal adenopathy is present.  The edematous mucosa and soft tissue swelling extends along the left aryepiglottic fold and fills the left piriform recess.  There is some effacement of the larynx although the vocal cords are symmetric and midline.  Asymmetric left-sided cervical adenopathy appears reactive with enlarged left level II lymph  nodes.  A posterior level II node measures 19 mm in long access.  No significant to right-sided adenopathy is present.  Limited imaging of the brain is unremarkable.  The lung apices are clear.  The bone windows demonstrate mild spondylosis at C4-5 and C5-6 with mild bilateral osseous foraminal narrowing at C5-6, worse on the left.  IMPRESSION:  1.  2.5 cm peripherally enhancing hypodense collection in the left palatine tonsil area of and is compatible with a left peritonsillar abscess. 2.  Extensive mucosal thickening and edema extends inferiorly along the left aryepiglottic fold and fills the piriform recess. 3.  Reactive left level II cervical adenopathy. 4.  Mild cervical spondylosis as described.  Original Report Authenticated By: Jamesetta Orleans. MATTERN, M.D.   Dg Chest Port 1 View  02/12/2012  *RADIOLOGY REPORT*  Clinical Data: Stridor.  PORTABLE CHEST - 1 VIEW  Comparison: None.  Findings: 0750 hours.  There are slightly low lung volumes. The heart size and mediastinal contours are normal. The lungs are clear. There is no pleural effusion or pneumothorax. No acute osseous findings are identified.  Numerous telemetry leads overlie the chest.  IMPRESSION: No active cardiopulmonary process.  Original Report Authenticated By: Gerrianne Scale, M.D.    Review of Systems  Eyes: Negative.   Skin: Negative.    Blood pressure 129/49, pulse 104, temperature 98.1 F (36.7 C), temperature source Oral, resp. rate 30, SpO2 100.00%. Physical Exam  Constitutional: He appears well-nourished.       Patient has some intermittent stridor and is on a nonrebreather O2. Most of the examination he did not have any stridor but I heard a few episodes where he flipped into stridorous sound and then swallowed or some type of neck maneuver and it stopped.  HENT:  Head: Atraumatic.  Nose: Nose normal.       There is some trismus which does make the examination somewhat difficult but the left tonsil can be evaluated with  a significant amount of erythema up over the soft palate. The uvula has edema. The tonsil is bulging slightly to the right. There is a significant amount of swelling in the left. Tonsillar area. Some mild exudate on the surface of the left tonsil. No swelling of the tongue. Fiberoptic  examination-the nasopharynx is clear the epiglottis is omega shaped and there is edema on left aryepiglottic fold. The swelling extends down the lateral left pharyngeal wall. The cords are difficult to see from above the epiglottis but once placed in through the omega-shaped of the epiglottis and the edematous area on the left aryepiglottic fold both vocal cords are visible and they both move well. There is no edema or swelling of the cords. There is no excessive swelling in the base of tongue.  Neck: Normal range of motion. Neck supple.  Cardiovascular: Normal rate.     Assessment/Plan: Left peritonsillar abscess-CT scan indicates a swelling in the left paratonsillar region with extension of the edema down the left lateral pharyngeal wall and into the aryepiglottic fold region. There is a obvious fluid collection. Fiberoptic exam indicates swelling of his supraglottis. Because of this and the risk of manipulation in the emergency room with any drainage procedure and his airway it was elected to proceed with operating room control and patient wanted to proceed with that anyway as he did not want to have a local procedure performed in the emergency room. We discussed the incision and drainage including risks, benefits, and options. All his questions are answered and consent was obtained. He understands that the endotracheal tube will need to be left in for at least 24 hours after the procedure to let some of the swelling resolve after the procedure as well as the manipulation of the endotracheal tube. He will need to be admitted and will be placed on the critical care service Suzanna Obey 02/12/2012, 11:06 AM

## 2012-02-12 NOTE — Preoperative (Signed)
Beta Blockers   Reason not to administer Beta Blockers:Not Applicable 

## 2012-02-12 NOTE — ED Provider Notes (Signed)
History     CSN: [id number]  Arrival date & time 02/12/12  4098   First MD Initiated Contact with Patient 02/12/12 [phone]      Chief Complaint  Patient presents with  . Wheezing    (Consider location/radiation/quality/duration/timing/severity/associated sxs/prior treatment) HPI Comments: Patient with a history of hypertension presents emergency Department with a chief complaint of stridor.  Symptom onset began earlier this morning around 5 a.m.  Patient denies any food or drug allergies, a history of reactions like this.  Associated symptoms include sore throat and cough x3 days, difficulty swallowing, shortness of breath.  Patient denies any dyspnea on exertion, leg swelling, chest pain, lip swelling, neck pain, recent dental work or dental pain.  Patient has no known sick contacts, history of asthma, or COPD.  Patient did not have a history of being intubated.  Patient is a 71 y.o. male presenting with wheezing. The history is provided by the patient.  Wheezing  Associated symptoms include sore throat and stridor. Pertinent negatives include no chest pain, no fever, no shortness of breath and no wheezing. He has had no prior steroid use.    Past Medical History  Diagnosis Date  . Hypertension     Past Surgical History  Procedure Date  . Colonoscopy     History reviewed. No pertinent family history.  History  Substance Use Topics  . Smoking status: Never Smoker   . Smokeless tobacco: Not on file  . Alcohol Use: No      Review of Systems  Constitutional: Negative for fever, chills and appetite change.  HENT: Positive for sore throat, trouble swallowing and voice change. Negative for nosebleeds, congestion, facial swelling, sneezing, drooling, mouth sores, neck pain, neck stiffness, dental problem, postnasal drip and sinus pressure.   Eyes: Negative for visual disturbance.  Respiratory: Positive for stridor. Negative for shortness of breath and wheezing.   Cardiovascular:  Negative for chest pain and leg swelling.  Gastrointestinal: Negative for abdominal pain.  Genitourinary: Negative for dysuria, urgency and frequency.  Neurological: Negative for dizziness, syncope, weakness, light-headedness, numbness and headaches.  Psychiatric/Behavioral: Negative for confusion.  All other systems reviewed and are negative.    Allergies  Rosuvastatin  Home Medications   No current outpatient prescriptions on file.  BP 110/54  Pulse 69  Temp(Src) 97.9 F (36.6 C) (Oral)  Resp 16  Ht 5\' 11"  (1.803 m)  Wt 178 lb 9.2 oz (81 kg)  BMI 24.91 kg/m2  SpO2 100%  Physical Exam  Nursing note and vitals reviewed. Constitutional: He is oriented to person, place, and time. He appears well-developed and well-nourished. No distress.  HENT:  Head: Normocephalic and atraumatic.  Mouth/Throat: Oropharynx is clear and moist and mucous membranes are normal.       No sign of airway obstruction. No edema of face, eyelids, lips, tongue, uvula.. Unable to visualize uvula midline d/t trismus. Pt able to open mouth 1-2cm, no nasal congestion or drooling.  Tongue not elevated, palate soft. Throat ttp left side greater then right  Neck: Trachea normal, normal range of motion and full passive range of motion without pain. Neck supple. Carotid bruit is not present. No tracheal deviation and no edema present.       No carotid bruits or stridor  Cardiovascular: Normal rate, regular rhythm, intact distal pulses and normal pulses.        Not tachycardic  Pulmonary/Chest: Effort normal. No stridor.       Tachypneic, Stridor, no wheezing, accessory muscle use  or nasal flaring   Musculoskeletal: Normal range of motion.  Neurological: He is alert and oriented to person, place, and time.  Skin: Skin is warm and intact. Rash noted. Rash is urticarial. He is not diaphoretic.       Not diaphoretic, no rash  Psychiatric: He has a normal mood and affect. His behavior is normal.    ED Course    Procedures (including critical care time)  Labs Reviewed  CBC - Abnormal; Notable for the following:    WBC 15.2 (*)    MCV 77.7 (*)    All other components within normal limits  DIFFERENTIAL - Abnormal; Notable for the following:    Neutrophils Relative 81 (*)    Neutro Abs 12.3 (*)    Lymphocytes Relative 8 (*)    Monocytes Absolute 1.6 (*)    All other components within normal limits  BASIC METABOLIC PANEL - Abnormal; Notable for the following:    Glucose, Bld 117 (*)    BUN 26 (*)    GFR calc non Af Amer 56 (*)    GFR calc Af Amer 65 (*)    All other components within normal limits  COMPREHENSIVE METABOLIC PANEL - Abnormal; Notable for the following:    Glucose, Bld 165 (*)    BUN 26 (*)    Calcium 8.3 (*)    Albumin 3.1 (*)    GFR calc non Af Amer 58 (*)    GFR calc Af Amer 67 (*)    All other components within normal limits  CBC - Abnormal; Notable for the following:    WBC 12.1 (*)    Hemoglobin 12.0 (*)    HCT 36.9 (*)    MCH 25.6 (*)    All other components within normal limits  DIFFERENTIAL - Abnormal; Notable for the following:    Neutrophils Relative 94 (*)    Neutro Abs 11.4 (*)    Lymphocytes Relative 4 (*)    Lymphs Abs 0.4 (*)    Monocytes Relative 2 (*)    All other components within normal limits  GLUCOSE, CAPILLARY - Abnormal; Notable for the following:    Glucose-Capillary 155 (*)    All other components within normal limits  PROCALCITONIN  LACTIC ACID, PLASMA  CORTISOL  MRSA PCR SCREENING   Ct Soft Tissue Neck W Contrast  02/12/2012  *RADIOLOGY REPORT*  Clinical Data: Wheezing.  Trismus.  Cough and sore throat.  CT NECK WITH CONTRAST  Technique:  Multidetector CT imaging of the neck was performed with intravenous contrast.  Contrast: 75mL OMNIPAQUE IOHEXOL 300 MG/ML  SOLN  Comparison: None.  Findings: A somewhat irregular peripherally enhancing hypodense collection in or adjacent to the left palatine tonsil has maximal dimensions of 2.5 x 1.9 x  1.8 cm, concerning for developing left peritonsillar abscess.  There is marked asymmetric soft tissue the soft tissue swelling about the left tonsil with some effacement of the airway on the left.  The airway is patent.  The lateral parapharyngeal fat is clear.  No significant lateral pharyngeal adenopathy is present.  The edematous mucosa and soft tissue swelling extends along the left aryepiglottic fold and fills the left piriform recess.  There is some effacement of the larynx although the vocal cords are symmetric and midline.  Asymmetric left-sided cervical adenopathy appears reactive with enlarged left level II lymph nodes.  A posterior level II node measures 19 mm in long access.  No significant to right-sided adenopathy is present.  Limited imaging  of the brain is unremarkable.  The lung apices are clear.  The bone windows demonstrate mild spondylosis at C4-5 and C5-6 with mild bilateral osseous foraminal narrowing at C5-6, worse on the left.  IMPRESSION:  1.  2.5 cm peripherally enhancing hypodense collection in the left palatine tonsil area of and is compatible with a left peritonsillar abscess. 2.  Extensive mucosal thickening and edema extends inferiorly along the left aryepiglottic fold and fills the piriform recess. 3.  Reactive left level II cervical adenopathy. 4.  Mild cervical spondylosis as described.  Original Report Authenticated By: Jamesetta Orleans. MATTERN, M.D.   Portable Chest Xray  02/12/2012  *RADIOLOGY REPORT*  Clinical Data: Endotracheal tube placement  PORTABLE CHEST - 1 VIEW  Comparison: Chest radiograph 02/12/2012  Findings: Endotracheal tube  is 6 cm from carina.  There is new focus of air space disease atelectasis in the right lower lobe.  No pneumothorax.  IMPRESSION:  1.  Endotracheal tube appears in good position. 2. New air space disease or atelectasis at the right base.  Original Report Authenticated By: Genevive Bi, M.D.   Dg Chest Port 1 View  02/12/2012  *RADIOLOGY  REPORT*  Clinical Data: Stridor.  PORTABLE CHEST - 1 VIEW  Comparison: None.  Findings: 0750 hours.  There are slightly low lung volumes. The heart size and mediastinal contours are normal. The lungs are clear. There is no pleural effusion or pneumothorax. No acute osseous findings are identified.  Numerous telemetry leads overlie the chest.  IMPRESSION: No active cardiopulmonary process.  Original Report Authenticated By: Gerrianne Scale, M.D.     1. Abscess, peritonsillar   2. Acute respiratory failure with hypoxia       MDM   Pt seen with Dr. Alto Denver. Stridor d/t peritonsillar abscess. Pt to be admitted to ICU Consult to ENT, pt taken up to OR for abscess drainage. The patient appears reasonably stabilized for admission considering the current resources, flow, and capabilities available in the ED at this time, and I doubt any other [facility] requiring further screening and/or treatment in the ED prior to admission.         Jaci Carrel, [place] 02/12/12 1623

## 2012-02-12 NOTE — Transfer of Care (Signed)
Immediate Anesthesia Transfer of Care Note  Patient: Zachary Hobbs  Procedure(s) Performed: Procedure(s) (LRB): INCISION AND DRAINAGE ABSCESS (Left)  Patient Location: ICU  Anesthesia Type: General  Level of Consciousness: unresponsive and Patient remains intubated per anesthesia plan  Airway & Oxygen Therapy: Patient remains intubated per anesthesia plan and Patient placed on Ventilator (see vital sign flow sheet for setting)  Post-op Assessment: Report given to PACU RN and Post -op Vital signs reviewed and stable  Post vital signs: Reviewed and stable  Complications: No apparent anesthesia complications

## 2012-02-12 NOTE — H&P (Signed)
Name: Zachary Hobbs MRN: [id number] DOB: 02-17-1941    LOS: 0  PCCM ADMIT  NOTE   History of Present Illness:  This is a 71 year old male patient who presented to the emergency room on 413 chief complaint of sore throat which had been progressive over several days. He was seen by ENT,  Diagnostic evaluation was consistent with left peritonsillar abscess. He was brought to the operating room on 02/12/2012 where he underwent incision and drainage of left peritonsillar abscess. He returned to the intensive care intubated, and on the mechanical ventilator, given concern for airway stability. Pulmonary critical care was asked to consult and assist with his management.  Lines / Drains: Oral endotracheal tube 4/13>>>  Cultures: MRSA PCR 4/13>>>>  Antibiotics: Unasyn ( paratonsillar abscess) 4/13>>>>  Tests / Events: CT of neck 4/13:1. 2.5 cm peripherally enhancing hypodense collection in the left palatine tonsil area of and is compatible with a left peritonsillar abscess. 2. Extensive mucosal thickening and edema extends inferiorly along the left aryepiglottic fold and fills the piriform recess. 3. Reactive left level II cervical adenopathy. 4. Mild cervical spondylosis as described   The patient is sedated, intubated and unable to provide history, which was obtained for available medical records.    Past Medical History  Diagnosis Date  . Hypertension    Past Surgical History  Procedure Date  . Colonoscopy    Prior to Admission medications   Medication Sig Start Date End Date Taking? Authorizing Provider  aspirin EC 81 MG tablet Take 81 mg by mouth daily.   Yes Historical Provider, MD  simvastatin (ZOCOR) 40 MG tablet Take 40 mg by mouth every evening.   Yes Historical Provider, MD  valsartan-hydrochlorothiazide (DIOVAN-HCT) 320-12.5 MG per tablet Take 1 tablet by mouth daily. 06/29/11 06/28/12 Yes Etta Grandchild, MD   Allergies Allergies  Allergen Reactions  .  Rosuvastatin     REACTION: myalgia    Family History History reviewed. No pertinent family history.  Social History  reports that he has never smoked. He does not have any smokeless tobacco history on file. He reports that he does not drink alcohol or use illicit drugs.  Review Of Systems  unable  Vital Signs: BP 149/69  Pulse 95  Temp(Src) 98.1 F (36.7 C) (Oral)  Resp 26  Ht 5\' 11"  (1.803 m)  Wt 81 kg (178 lb 9.2 oz)  BMI 24.91 kg/m2  SpO2 100%       . sodium chloride    . propofol       Intake/Output Summary (Last 24 hours) at 02/12/12 1334 Last data filed at 02/12/12 1230  Gross per 24 hour  Intake    900 ml  Output      0 ml  Net    900 ml    Physical Examination: General:  71 year old male currently sedated on to prevent infusion, appears comfortable on full ventilator support. Neuro:  Sedated on vent HEENT:  Orally intubated Neck:  supple Cardiovascular: systolic ejection murmur Lungs:  Clear to auscultation Abdomen:  Soft nontender, no organomegaly, positive bowel sounds. Musculoskeletal:  intact Skin:  intact  Ventilator settings: Vent Mode:  [-] PRVC FiO2 (%):  [50 %] 50 % Set Rate:  [18 bmp] 18 bmp Vt Set:  [550 mL] 550 mL PEEP:  [5 cmH20] 5 cmH20 Plateau Pressure:  [20 cmH20] 20 cmH20  Labs and Imaging:   Lab 02/12/12 0754  NA 140  K 3.6  CL 101  CO2 26  BUN 26*  CREATININE 1.28  GLUCOSE 117*    Lab 02/12/12 0754  HGB 14.1  HCT 41.9  WBC 15.2*  PLT 243   Chest x-ray Endotracheal tube in satisfactory position, no obvious infiltrates or consolidations.  Assessment and Plan:  Abscess, peritonsillar status post incision and drainage 4/13:  Plan: -Continue current antibiotics Unasyn -Further recommendations per ENT  Leukocytosis: Secondary to above   Lab 02/12/12 0754  WBC 15.2*  Plan: -trend  Acute respiratory failure: in the setting of high risk for airway obstruction due to peritonsillar abscess. Now intubated  status post incision and drainage.  Plan: -Continue Ventilator support -antibiotics to treat abscess -When necessary bronchodilators -Sedating drips propofol -We will need collaboration with ENT, prior to extubation  Hyperglycemia Plan: Sliding scale insulin   Best practices / Disposition: -->ICU status under PCCM -->full code -->PAS for DVT Px -->Protonix for GI Px -->ventilator bundle -->diet -->family updated at bedside  The patient is critically ill with multiple organ systems failure and requires high complexity decision making for assessment and support, frequent evaluation and titration of therapies, application of advanced monitoring technologies and extensive interpretation of multiple databases. Critical Care Time devoted to patient care services described in this note is 40 minutes.  BABCOCK,PETE 02/12/2012, 1:34 PM  I have seen and examined this patient with the nurse practionner and agree with the above assessment and plan. Shan Levans Beeper  [phone]  Cell  [phone]  If no response or cell goes to voicemail, call beeper [phone]

## 2012-02-12 NOTE — Op Note (Signed)
Preop/postop diagnosis: Left peritonsillar abscess Procedure: Incision and drainage left peritonsillar abscess Anesthesia Gen. Estimated blood loss approximately 10 cc Indications: 71 year old with a many week history of sore throat that has worsened over the last few days. In the emergency room he had a fiberoptic exam that showed swelling of his larynx in the location of the left aryepiglottic fold. There is also swelling on the lateral pharyngeal wall and an abscess within the left tonsil. This was confirmed with CT scan that did show all these areas to be swollen and a 2 cm abscess in the left tonsil area. He did not want to proceed with any procedure in the emergency room and because of his airway issue it was safer to perform in the operating room. He understands he'll remain intubated secondary to the swelling. Risks, benefits, and options were discussed. All his questions were answered and consent was obtained. Operation: Patient was taken to the operating room and after glide scope intubation he was placed in the [facility] position. A Crowe-Davis mouth gag inserted retracted and suspended from the [employer] stand. Pus was draining from the left tonsil itself an incision was made over the left peritonsillar area. Using the tonsil hemostat the peritonsillar space was opened and pus was expressed. A fairly significant amount of pus was expressed even though it had spontaneously drained through the tonsil tissue. The wound space was irrigated with saline. There was good hemostasis. There was a lot of edema on the lateral pharyngeal wall that could be seen. The Crowe-Davis was released and resuspended and there was hemostasis in all locations. The tube was left in place and the patient was taken the intensive care unit in stable condition.

## 2012-02-12 NOTE — ED Notes (Signed)
Pt's Watch left by son, placed in bag with label and  given to ED window secretary for friend to pick up

## 2012-02-12 NOTE — Anesthesia Preprocedure Evaluation (Signed)
Anesthesia Evaluation  Patient identified by MRN, date of birth, ID band Patient awake    Reviewed: Allergy & Precautions, H&P , NPO status , Patient's Chart, lab work & pertinent test results  History of Anesthesia Complications Negative for: history of anesthetic complications  Airway Mallampati: III TM Distance: >3 FB Neck ROM: Limited  Mouth opening: Limited Mouth Opening  Dental  (+) Loose, Poor Dentition and Dental Advisory Given   Pulmonary neg pulmonary ROS,  breath sounds clear to auscultation  Pulmonary exam normal       Cardiovascular hypertension, Pt. on medications Rhythm:Regular Rate:Normal     Neuro/Psych PSYCHIATRIC DISORDERS Anxiety negative neurological ROS     GI/Hepatic Neg liver ROS, GERD-  Poorly Controlled,  Endo/Other  negative endocrine ROS  Renal/GU negative Renal ROS     Musculoskeletal   Abdominal   Peds  Hematology negative hematology ROS (+)   Anesthesia Other Findings   Reproductive/Obstetrics                           Anesthesia Physical Anesthesia Plan  ASA: II and Emergent  Anesthesia Plan: General   Post-op Pain Management:    Induction: Intravenous and Rapid sequence  Airway Management Planned: Video Laryngoscope Planned  Additional Equipment:   Intra-op Plan:   Post-operative Plan: Post-operative intubation/ventilation  Informed Consent: I have reviewed the patients History and Physical, chart, labs and discussed the procedure including the risks, benefits and alternatives for the proposed anesthesia with the patient or authorized representative who has indicated his/her understanding and acceptance.   Dental advisory given  Plan Discussed with: CRNA and Surgeon  Anesthesia Plan Comments: (Plan routine monitors, GETA with RSI and VideoGlide intubation   )        Anesthesia Quick Evaluation

## 2012-02-12 NOTE — ED Provider Notes (Signed)
The patient was assessed multiple times by myself during his visit. His airway was stable. Patient did have peritonsillar abscess. He was evaluated and in the eye Dr. Jearld Fenton of ENT. Given his supraglottic swelling and trismus the patient will be taken to the OR for operative management. I did place a call to critical care regarding the patient. They were contacted as the patient will remain intubated following surgery. Dr. Delford Field took my call. Patient was going directly to the OR. Patient will be assessed postoperatively by the critical care team for admission.  CRITICAL CARE Performed by: Cyndra Numbers   Total critical care time: 30 minutes.  Critical care time was exclusive of separately billable procedures and treating other patients.  Critical care was necessary to treat or prevent imminent or life-threatening deterioration.  Critical care was time spent personally by me on the following activities: development of treatment plan with patient and/or surrogate as well as nursing, discussions with consultants, evaluation of patient's response to treatment, examination of patient, obtaining history from patient or surrogate, ordering and performing treatments and interventions, ordering and review of laboratory studies, ordering and review of radiographic studies, pulse oximetry and re-evaluation of patient's condition.  Diagnosis: Left peritonsillar abscess   Cyndra Numbers, MD 02/12/12 1146

## 2012-02-12 NOTE — ED Notes (Signed)
Pt presents with shortness of breath and inspiratory wheezing. Pt reports symptoms onset this morning. Pt has had cough and sore throat for a couple of days. Pt has not taken his BP meds or eaten for a couple of days.

## 2012-02-13 ENCOUNTER — Inpatient Hospital Stay (HOSPITAL_COMMUNITY): Payer: Medicare HMO

## 2012-02-13 LAB — BASIC METABOLIC PANEL
CO2: 25 mEq/L (ref 19–32)
Chloride: 109 mEq/L (ref 96–112)
Glucose, Bld: 96 mg/dL (ref 70–99)
Potassium: 3.4 mEq/L — ABNORMAL LOW (ref 3.5–5.1)
Sodium: 144 mEq/L (ref 135–145)

## 2012-02-13 LAB — CBC
MCH: 25.9 pg — ABNORMAL LOW (ref 26.0–34.0)
MCHC: 33.8 g/dL (ref 30.0–36.0)
Platelets: 210 10*3/uL (ref 150–400)

## 2012-02-13 LAB — GLUCOSE, CAPILLARY
Glucose-Capillary: 123 mg/dL — ABNORMAL HIGH (ref 70–99)
Glucose-Capillary: 96 mg/dL (ref 70–99)
Glucose-Capillary: 89 mg/dL (ref 70–99)

## 2012-02-13 MED ORDER — FENTANYL CITRATE 0.05 MG/ML IJ SOLN
12.5000 ug | INTRAMUSCULAR | Status: DC | PRN
  Administered 2012-02-13: 50 ug via INTRAVENOUS
  Filled 2012-02-13: qty 2

## 2012-02-13 NOTE — Procedures (Signed)
Extubation Procedure Note  Patient Details:   Name: Zachary Hobbs DOB: [birth year] MRN: [id number]   Airway Documentation:     Evaluation  O2 sats: stable throughout Complications: No apparent complications Patient did tolerate procedure well. Bilateral Breath Sounds: Diminished Suctioning: Airway Yes  No stridor noted at this time.   Fara Olden 02/13/2012, 9:40 AM

## 2012-02-13 NOTE — Progress Notes (Signed)
HPI:  This is a 71 year old male patient who presented to the emergency room on 413 chief complaint of sore throat which had been progressive over several days. He was seen by ENT, Diagnostic evaluation was consistent with left peritonsillar abscess. He was brought to the operating room on 02/12/2012 where he underwent incision and drainage of left peritonsillar abscess. He returned to the intensive care intubated, and on the mechanical ventilator, given concern for airway stability. Pulmonary critical care was asked to consult and assist with his management.  Lines / Drains:  Oral endotracheal tube 4/13>>> 4/14  Cultures:  MRSA PCR 4/13>>>>   Antibiotics:  Unasyn ( paratonsillar abscess) 4/13>>>>   Tests / Events:  CT of neck 4/13:1. 2.5 cm peripherally enhancing hypodense collection in the left palatine tonsil area of and is compatible with a left peritonsillar abscess. 2. Extensive mucosal thickening and edema extends inferiorly along the left aryepiglottic fold and fills the piriform recess. 3. Reactive left level II cervical adenopathy. 4. Mild cervical spondylosis as described  The patient is sedated, intubated and unable to provide history, which was obtained for available medical records.   Best Practice: DVT: SCD GI: Protonix  Subjective: Pt is intubated, but alert  Physical Exam: Filed Vitals:   02/13/12 0800  BP: 119/81  Pulse: 81  Temp:   Resp: 24    Intake/Output Summary (Last 24 hours) at 02/13/12 0832 Last data filed at 02/13/12 0800  Gross per 24 hour  Intake 2651.4 ml  Output   1190 ml  Net 1461.4 ml   Vent Mode:  [-] PSV FiO2 (%):  [40 %-50 %] 40 % Set Rate:  [16 bmp-18 bmp] 16 bmp Vt Set:  [550 mL] 550 mL PEEP:  [5 cmH20] 5 cmH20 Pressure Support:  [5 cmH20] 5 cmH20 Plateau Pressure:  [15 cmH20-20 cmH20] 15 cmH20  General: NAD appears comfortable on full ventilator support. , cuff leak present Neuro: awake and alert HEENT: Orally intubated  Neck:  supple  Cardiovascular: systolic ejection murmur  Lungs: Clear to auscultation  Abdomen: Soft nontender, no organomegaly, positive bowel sounds.  Musculoskeletal: intact  Skin: intact  Labs: CBC:    Component Value Date/Time   WBC 12.9* 02/13/2012 0440   HGB 11.5* 02/13/2012 0440   HCT 34.0* 02/13/2012 0440   PLT 210 02/13/2012 0440   MCV 76.6* 02/13/2012 0440   NEUTROABS 11.4* 02/12/2012 1315   LYMPHSABS 0.4* 02/12/2012 1315   MONOABS 0.3 02/12/2012 1315   EOSABS 0.0 02/12/2012 1315   BASOSABS 0.0 02/12/2012 1315     Basic Metabolic Panel:    Component Value Date/Time   NA 137 02/12/2012 1315   K 3.6 02/12/2012 1315   CL 102 02/12/2012 1315   CO2 23 02/12/2012 1315   BUN 26* 02/12/2012 1315   CREATININE 1.24 02/12/2012 1315   GLUCOSE 165* 02/12/2012 1315   CALCIUM 8.3* 02/12/2012 1315   Comprehensive Metabolic Panel:    Component Value Date/Time   NA 137 02/12/2012 1315   K 3.6 02/12/2012 1315   CL 102 02/12/2012 1315   CO2 23 02/12/2012 1315   BUN 26* 02/12/2012 1315   CREATININE 1.24 02/12/2012 1315   GLUCOSE 165* 02/12/2012 1315   CALCIUM 8.3* 02/12/2012 1315   AST 14 02/12/2012 1315   ALT 14 02/12/2012 1315   ALKPHOS 89 02/12/2012 1315   BILITOT 0.8 02/12/2012 1315   PROT 6.4 02/12/2012 1315   ALBUMIN 3.1* 02/12/2012 1315    ABG    Component Value  Date/Time   TCO2 27 01/26/2011 1052    No results found for this basename: MG in the last 168 hours Lab Results  Component Value Date   CALCIUM 8.3* 02/12/2012     Assessment & Plan:  Abscess, peritonsillar status post incision and drainage 4/13:  Plan:  -cont abx -per ENT -extubate  Leukocytosis: Secondary to above  Plan:  -Continue Unasyn  Acute respiratory failure: in the setting of high risk for airway obstruction due to peritonsillar abscess. Now intubated status post incision and drainage.  Plan:  Passed cuff leak, conferred with ENT, ok to extubate, did so at 930AM and pt has done well so far.   Best practices  / Disposition:  -->ICU status under PCCM  -->full code  -->PAS for DVT Px  -->Protonix for GI Px  -->diet NPO -->family updated at bedside    [facility], MD      I have seen and examined this patient with the resident  and agree with the above assessment and plan.    Shan Levans Beeper  [phone]  Cell  [phone]  If no response or cell goes to voicemail, call beeper [phone]

## 2012-02-13 NOTE — Progress Notes (Signed)
eLink Physician-Brief Progress Note Patient Name: Zachary Hobbs DOB: Jul 31, 1941 MRN: [id number]  Date of Service  02/13/2012   HPI/Events of Note   D/w Dr Shan Levans bedsided CCM MD  eICU Interventions  Triage to regular medical bed   Intervention Category Intermediate Interventions: Other:  Minami Arriaga 02/13/2012, 6:23 PM

## 2012-02-13 NOTE — Progress Notes (Signed)
He is awake and wants tube out  There is decreased swelling by exam of the oropharynx but the tube limits total evaluation. There is a large air leak with cuff deflation so I think extubation can be performed and I discussed with Dr. Delford Field. He will start the extubation protocol.   He will need about 7 additional days of abx outpatient with Augmentin. Will start clear liquids after he is extubated.

## 2012-02-14 DIAGNOSIS — I10 Essential (primary) hypertension: Secondary | ICD-10-CM

## 2012-02-14 DIAGNOSIS — F411 Generalized anxiety disorder: Secondary | ICD-10-CM

## 2012-02-14 LAB — BASIC METABOLIC PANEL
BUN: 21 mg/dL (ref 6–23)
CO2: 23 mEq/L (ref 19–32)
Calcium: 8.5 mg/dL (ref 8.4–10.5)
Chloride: 108 mEq/L (ref 96–112)
Creatinine, Ser: 1.05 mg/dL (ref 0.50–1.35)
GFR calc Af Amer: 82 mL/min — ABNORMAL LOW (ref >90)
GFR calc non Af Amer: 71 mL/min — ABNORMAL LOW (ref >90)
Glucose, Bld: 99 mg/dL (ref 70–99)
Potassium: 3.6 mEq/L (ref 3.5–5.1)
Sodium: 142 mEq/L (ref 135–145)

## 2012-02-14 LAB — CBC
HCT: 37.9 % — ABNORMAL LOW (ref 39.0–52.0)
Hemoglobin: 12.5 g/dL — ABNORMAL LOW (ref 13.0–17.0)
MCH: 25.7 pg — ABNORMAL LOW (ref 26.0–34.0)
MCHC: 33 g/dL (ref 30.0–36.0)
MCV: 77.8 fL — ABNORMAL LOW (ref 78.0–100.0)
Platelets: 242 10*3/uL (ref 150–400)
RBC: 4.87 MIL/uL (ref 4.22–5.81)
RDW: 13.7 % (ref 11.5–15.5)
WBC: 9.3 10*3/uL (ref 4.0–10.5)

## 2012-02-14 LAB — GLUCOSE, CAPILLARY
Glucose-Capillary: 115 mg/dL — ABNORMAL HIGH (ref 70–99)
Glucose-Capillary: 126 mg/dL — ABNORMAL HIGH (ref 70–99)
Glucose-Capillary: 90 mg/dL (ref 70–99)
Glucose-Capillary: 93 mg/dL (ref 70–99)

## 2012-02-14 MED ORDER — AMOXICILLIN-POT CLAVULANATE 875-125 MG PO TABS
1.0000 | ORAL_TABLET | Freq: Two times a day (BID) | ORAL | Status: DC
  Administered 2012-02-14 – 2012-02-15 (×3): 1 via ORAL
  Filled 2012-02-14 (×6): qty 1

## 2012-02-14 MED ORDER — AMOXICILLIN-POT CLAVULANATE 875-125 MG PO TABS: 1.0000 | ORAL_TABLET | Freq: Two times a day (BID) | ORAL | Status: AC

## 2012-02-14 NOTE — Anesthesia Postprocedure Evaluation (Signed)
  Anesthesia Post-op Note  Patient: Zachary Hobbs  Procedure(s) Performed: Procedure(s) (LRB): INCISION AND DRAINAGE ABSCESS (Left)  Patient Location: Nursing Unit  Anesthesia Type: General  Level of Consciousness: awake, alert , oriented and patient cooperative  Airway and Oxygen Therapy: Patient Spontanous Breathing and Patient connected to nasal cannula oxygen  Post-op Pain: none  Post-op Assessment: Post-op Vital signs reviewed, Patient's Cardiovascular Status Stable, Respiratory Function Stable, Patent Airway and No signs of Nausea or vomiting  Post-op Vital Signs: Reviewed  Complications: No apparent anesthesia complications

## 2012-02-14 NOTE — Progress Notes (Signed)
He is doing better but still has a significant amount of trismus. His throat is still sore but better. He's taking fluids recently well but not able to eat well. He still has swelling in the left lateral pharyngeal region but it is improved. He will continue IV antibiotics for today and then plan on discharge in the morning if he has additional improvement.

## 2012-02-14 NOTE — Discharge Summary (Signed)
Physician Discharge Summary  Patient ID: Zachary Hobbs MRN: [id number] DOB/AGE: 09-Jul-1941 71 y.o.  Admit date: 02/12/2012 Discharge date: 02/14/2012    Discharge Diagnoses:  Active Problems:  Abscess, peritonsillar  Acute respiratory failure with hypoxia  Hyperglycemia    Brief Summary: Zachary Hobbs is a 71 year old male patient who presented to the emergency room on 4/13 chief complaint of sore throat which had been progressive over several days. He was seen by ENT, Diagnostic evaluation was consistent with left peritonsillar abscess. He was brought to the operating room on 02/12/2012 where he underwent incision and drainage of left peritonsillar abscess. He returned to the intensive care intubated, and on the mechanical ventilator, given concern for airway stability. Pulmonary critical care was asked to consult and assist with his management.    Hospital Course 1. Peritonsillar abscess -- s/p I&D per ENT.  Treated with IV unasyn.  Will transition to PO Augmentin x 7 more days at d/c. Will f/u with ENT as outpt. Pt tol PO diet, denies pain, denies difficulty swallowing or SOB.  Afebrile, WBC trending down quickly.    2. Acute respiratory failure -- r/t above.  Left intubated overnight post OR.  Passed cuff leak prior to extubation and extubated with ENT present.  No further pulmonary issues.   Lines / Drains:  Oral endotracheal tube 4/13>>> 4/14   Cultures:  MRSA PCR 4/13>>>> negative  Antibiotics:  Unasyn ( paratonsillar abscess) 4/13>>>>   Tests / Events:  CT of neck 4/13:1. 2.5 cm peripherally enhancing hypodense collection in the left palatine tonsil area of and is compatible with a left peritonsillar abscess. 2. Extensive mucosal thickening and edema extends inferiorly along the left aryepiglottic fold and fills the piriform recess. 3. Reactive left level II cervical adenopathy. 4. Mild cervical spondylosis as described   4/13>>> I&D Left peritonsillar abscess  Jearld Fenton)  Best Practice:  DVT: SCD  GI: Protonix  Exam --  General: pleasant male, NAD eating breakfast Neuro: awake, alert, MAE HEENT: mm moist, no JVD, no LA, neck supple -- subjectively no difficulty swallowing, tol PO diet CV: s1s2 rrr PULM: resps even non labored on RA, CTA GI: abd soft, +bs Extremities: warm and dry no edema    Discharge Labs  BMET  Lab 02/14/12 0732 02/13/12 0837 02/12/12 1315 02/12/12 0754  NA 142 144 137 140  K 3.6 3.4* -- --  CL 108 109 102 101  CO2 23 25 23 26   GLUCOSE 99 96 165* 117*  BUN 21 25* 26* 26*  CREATININE 1.05 1.21 1.24 1.28  CALCIUM 8.5 7.9* 8.3* 9.1  MG -- -- -- --  PHOS -- -- -- --     CBC   Lab 02/14/12 0732 02/13/12 0440 02/12/12 1315  HGB 12.5* 11.5* 12.0*  HCT 37.9* 34.0* 36.9*  WBC 9.3 12.9* 12.1*  PLT 242 210 204   Discharge Orders    Future Orders Please Complete By Expires   Diet - low sodium heart healthy      Comments:   Soft diet   Increase activity slowly      Discharge instructions      Comments:   Call office immediately or return to ER for severe throat pain, swelling, difficulty swallowing, shortness of breath or excessive drooling.       Follow-up Information    Follow up with Oliver Barre, MD. Schedule an appointment as soon as possible for a visit in 2 weeks.      Follow up with  Suzanna Obey, MD on 02/22/2012. (1:30pm  -- arrive at 1:15pm)    Contact information:   [facility] [address] [place] [address] [phone]          Zachary Hobbs, Zachary Hobbs  Home Medication Instructions [id number]:[id number]   Printed on:02/14/12 8413  Medication Information                    aspirin EC 81 MG tablet Take 81 mg by mouth daily.           simvastatin (ZOCOR) 40 MG tablet Take 40 mg by mouth every evening.           valsartan-hydrochlorothiazide (DIOVAN-HCT) 320-12.5 MG per tablet Take 1 tablet by mouth daily.            amoxicillin-clavulanate (AUGMENTIN) 875-125 MG per tablet Take 1 tablet by mouth every 12 (twelve) hours.               Disposition: 01-Home or Self Care  Discharged Condition: Zachary Hobbs has met maximum benefit of inpatient care and is medically stable and cleared for discharge.  Patient is pending follow up as above.      Time spent on disposition:  Greater than 35 minutes.   SignedDanford Bad, NP 02/14/2012  9:29 AM Pager: [phone]  *Care during the described time interval was provided by me and/or other providers on the critical care team. I have reviewed this patient's available data, including medical history, events of note, physical examination and test results as part of my evaluation.    Pt anxious re discharge, had sense he couldn't swallow when woke up this am and high risk to return to er so ok with me to keep until his ride arriveds 4/16  Sandrea Hughs, MD Pulmonary and Critical Care Medicine [facility] Cell [phone]

## 2012-02-15 MED ORDER — AMOXICILLIN-POT CLAVULANATE 875-125 MG PO TABS: 1.0000 | ORAL_TABLET | Freq: Two times a day (BID) | ORAL | Status: AC

## 2012-02-15 NOTE — Discharge Instructions (Signed)
Followup in one week. Call if you have any problems with breathing, increasing pain, fever or increasing swelling. There should be rapid improvement to complete resolution within the next couple of days. Normal diet.

## 2012-02-15 NOTE — Progress Notes (Signed)
Patient readiness for d/c reviewed.  He indicates he is ready for discharge.  See note from 4/15.  Discussed with RN.  Discharge paperwork / scripts with RN.  Follow up as instructed with Dr. Jearld Fenton.    Canary Brim, NP-C [facility] Pgr: [phone]  Pt independently  seen and examined and available cxr's reviewed and I agree with above findings/ imp/ plan   Sandrea Hughs, MD Pulmonary and Critical Care Medicine [facility] Cell [phone]

## 2012-02-15 NOTE — Progress Notes (Signed)
He is doing much better today. He is taking fluids well and feels like he daily. He still has some trismus but better. His pain is essentially resolved except when he tries to chew or eat. His voice is normal.  His pharynx looks much better still slight amount swelling. He has no stridor or breathing difficulties. Neck is without swelling or adenopathy. He seems to be ready for discharge and states he is ready to go. He will be discharged on Augmentin. He will followup in one week sooner if he has any new or worsening issues.

## 2012-02-15 NOTE — Discharge Summary (Signed)
Physician Discharge Summary  Patient ID: Zachary Hobbs MRN: [id number] DOB/AGE: [birth year] 72 y.o.  Admit date: 02/12/2012 Discharge date: 02/15/2012  Admission Diagnoses: Left peritonsillar abscess Discharge Diagnoses: Same Active Problems:  Abscess, peritonsillar  Acute respiratory failure with hypoxia  Hyperglycemia   Discharged Condition: good  Hospital Course: Patient was admitted after having respiratory distress secondary to swelling from a pharyngeal abscess. He was taken to the operating room and underwent an incision and drainage. He was left intubated because of the swelling in his larynx. He was extubated the following day and now is recovering well. He has no further stridor or breathing difficulties. He is drinking and eating reasonably well. Still has some trismus but is improving rapidly. His pain is well-controlled. He is discharged on antibiotics.  Consults: pulmonary/intensive care  Significant Diagnostic Studies: None  Treatments: surgery: Incision and drainage of left peritonsillar abscess  Discharge Exam: Blood pressure 183/84, pulse 58, temperature 98.7 F (37.1 C), temperature source Oral, resp. rate 19, height 5\' 11"  (1.803 m), weight 83.2 kg (183 lb 6.8 oz), SpO2 99.00%.  [place] and alert, nose is clear. Oral cavity/oropharynx still slight amount of swelling but much improved from original exam. Neck is without swelling or adenopathy. Voice is completely normal with no stridor.  Disposition: 01-Home or Self Care  Discharge Orders    Future Orders Please Complete By Expires   Diet - low sodium heart healthy      Comments:   Soft diet   Increase activity slowly      Discharge instructions      Comments:   Call office immediately or return to ER for severe throat pain, swelling, difficulty swallowing, shortness of breath or excessive drooling.     Medication List  As of 02/15/2012  8:01 AM   TAKE these medications         amoxicillin-clavulanate  875-125 MG per tablet   Commonly known as: AUGMENTIN   Take 1 tablet by mouth every 12 (twelve) hours.      aspirin EC 81 MG tablet   Take 81 mg by mouth daily.      simvastatin 40 MG tablet   Commonly known as: ZOCOR   Take 40 mg by mouth every evening.      valsartan-hydrochlorothiazide 320-12.5 MG per tablet   Commonly known as: DIOVAN-HCT   Take 1 tablet by mouth daily.           Follow-up Information    Follow up with Oliver Barre, MD. Schedule an appointment as soon as possible for a visit in 2 weeks.      Follow up with Suzanna Obey, MD on 02/22/2012. (1:30pm  -- arrive at 1:15pm)    Contact information:   [facility] [address], [address] [place] [address] [phone]          Signed: Suzanna Obey 02/15/2012, 8:01 AM

## 2012-02-15 NOTE — ED Provider Notes (Signed)
Medical screening examination/treatment/procedure(s) were conducted as a shared visit with non-physician practitioner(s) and myself.  I personally evaluated the patient during the encounter  Patient was discussed with Dr. Jearld Fenton of ENT. He will take the patient to the OR. I also discussed the patient with Dr. Delford Field of [facility]. This is because the patient will be discharged to the ICU intubated following procedure. Patient was reassessed multiple times by myself.  CRITICAL CARE Performed by: Cyndra Numbers   Total critical care time: 30 minutes  Critical care time was exclusive of separately billable procedures and treating other patients.  Critical care was necessary to treat or prevent imminent or life-threatening deterioration.  Critical care was time spent personally by me on the following activities: development of treatment plan with patient and/or surrogate as well as nursing, discussions with consultants, evaluation of patient's response to treatment, examination of patient, obtaining history from patient or surrogate, ordering and performing treatments and interventions, ordering and review of laboratory studies, ordering and review of radiographic studies, pulse oximetry and re-evaluation of patient's condition.   Cyndra Numbers, MD 02/15/12 [phone]

## 2012-07-04 ENCOUNTER — Encounter: Payer: Self-pay | Admitting: Internal Medicine

## 2015-12-19 ENCOUNTER — Encounter (HOSPITAL_COMMUNITY): Payer: Self-pay | Admitting: *Deleted

## 2015-12-19 ENCOUNTER — Emergency Department (HOSPITAL_COMMUNITY)
Admission: EM | Admit: 2015-12-19 | Discharge: 2015-12-19 | Disposition: A | Discharge status: Home / Self Care | Payer: Medicare HMO | Attending: Physician Assistant | Admitting: Physician Assistant

## 2015-12-19 DIAGNOSIS — Z7982 Long term (current) use of aspirin: Secondary | ICD-10-CM | POA: Diagnosis not present

## 2015-12-19 DIAGNOSIS — H409 Unspecified glaucoma: Secondary | ICD-10-CM | POA: Diagnosis not present

## 2015-12-19 DIAGNOSIS — Z79899 Other long term (current) drug therapy: Secondary | ICD-10-CM | POA: Diagnosis not present

## 2015-12-19 DIAGNOSIS — H8109 Meniere's disease, unspecified ear: Secondary | ICD-10-CM | POA: Insufficient documentation

## 2015-12-19 DIAGNOSIS — R112 Nausea with vomiting, unspecified: Secondary | ICD-10-CM | POA: Diagnosis present

## 2015-12-19 DIAGNOSIS — I10 Essential (primary) hypertension: Secondary | ICD-10-CM | POA: Insufficient documentation

## 2015-12-19 MED ORDER — MECLIZINE HCL 50 MG PO TABS: 50.0000 mg | ORAL_TABLET | Freq: Three times a day (TID) | ORAL | Status: AC | PRN

## 2015-12-19 MED ORDER — ONDANSETRON HCL 4 MG PO TABS: 4.0000 mg | ORAL_TABLET | Freq: Three times a day (TID) | ORAL | Status: DC | PRN

## 2015-12-19 MED ORDER — MECLIZINE HCL 25 MG PO TABS
50.0000 mg | ORAL_TABLET | Freq: Once | ORAL | Status: AC
  Administered 2015-12-19: 50 mg via ORAL
  Filled 2015-12-19: qty 2

## 2015-12-19 NOTE — ED Provider Notes (Addendum)
CSN: [id number]     Arrival date & time 12/19/15  1439 History   First MD Initiated Contact with Patient 12/19/15 1709     Chief Complaint  Patient presents with  . Emesis  . Dehydration     (Consider location/radiation/quality/duration/timing/severity/associated sxs/prior Treatment) HPI   Patient is a 75 year old male presenting with episodic dizziness for the last 2-3 weeks. Patient reports that occasionally when he looks to the right or sits up he gets acute onset of dizziness, nausea, vomiting. Patient reports he's not had this in the past. He reports that after sitting still for movement it gets better. He is able to eat as long as he is not in one of his dizzy phases. He also has noted tinnitus. He can still hear well but the tenderness is bothersome. He's had no recent URI.   Patient does have glaucoma for which he had a surgery planned today but felt too dizzy at the time to go to it. Patient has not had increased trouble with vision or pain in his eye.  Past Medical History  Diagnosis Date  . Hypertension    Past Surgical History  Procedure Laterality Date  . Colonoscopy     No family history on file. Social History  Substance Use Topics  . Smoking status: Never Smoker   . Smokeless tobacco: None  . Alcohol Use: No    Review of Systems  Constitutional: Negative for fever and activity change.  HENT: Negative for congestion, dental problem, drooling, ear pain, facial swelling and hearing loss.   Respiratory: Negative for shortness of breath.   Cardiovascular: Negative for chest pain.  Gastrointestinal: Negative for abdominal pain.  Genitourinary: Negative for dysuria.  Neurological: Positive for dizziness. Negative for tremors, seizures, syncope, speech difficulty and weakness.  Psychiatric/Behavioral: Negative for agitation.  All other systems reviewed and are negative.     Allergies  Rosuvastatin  Home Medications   Prior to Admission medications    Medication Sig Start Date End Date Taking? Authorizing Provider  aspirin EC 81 MG tablet Take 81 mg by mouth daily.   Yes Historical Provider, MD  omeprazole (PRILOSEC OTC) 20 MG tablet Take 20 mg by mouth daily.   Yes Historical Provider, MD  simvastatin (ZOCOR) 40 MG tablet Take 40 mg by mouth every evening.   Yes Historical Provider, MD  valsartan-hydrochlorothiazide (DIOVAN-HCT) 320-25 MG tablet Take 1 tablet by mouth daily. 12/05/15  Yes Historical Provider, MD  meclizine (ANTIVERT) 50 MG tablet Take 1 tablet (50 mg total) by mouth 3 (three) times daily as needed. 12/19/15   Valdis Bevill Lyn Johnna Bollier, MD  ondansetron (ZOFRAN) 4 MG tablet Take 1 tablet (4 mg total) by mouth every 8 (eight) hours as needed for nausea or vomiting. 12/19/15   Runell Kovich Lyn Norine Reddington, MD   BP 167/70 mmHg  Pulse 50  Temp(Src) 97.5 F (36.4 C) (Oral)  Resp 18  SpO2 100% Physical Exam  Constitutional: He is oriented to person, place, and time. He appears well-nourished.  HENT:  Head: Normocephalic.  Mouth/Throat: Oropharynx is clear and moist.  Eyes: Conjunctivae are normal.  Nystagmus when looking to left only.  Inducible dizziness with Dix-Hallpike  Neck: No tracheal deviation present.  Cardiovascular: Normal rate.   Pulmonary/Chest: Effort normal. No stridor. No respiratory distress.  Abdominal: Soft. There is no tenderness. There is no guarding.  Musculoskeletal: Normal range of motion. He exhibits no edema.  Neurological: He is oriented to person, place, and time. No cranial nerve deficit.  Patient  has no neurologic deficits. Dizziness episodic, not constant.  Skin: Skin is warm and dry. No rash noted. He is not diaphoretic.  Psychiatric: He has a normal mood and affect. His behavior is normal.  Nursing note and vitals reviewed.   ED Course  Procedures (including critical care time) Labs Review Labs Reviewed - No data to display  Imaging Review No results found. I have personally reviewed and  evaluated these images and lab results as part of my medical decision-making.   EKG Interpretation   Date/Time:  Friday December 19 2015 18:50:59 EST Ventricular Rate:  47 PR Interval:  157 QRS Duration: 102 QT Interval:  442 QTC Calculation: 391 R Axis:   64 Text Interpretation:  Sinus bradycardia Borderline ST elevation, anterior  leads No prior EKG for comparison Sinus bradycardia Reconfirmed by  Gerald Leitz ([id number]) on 12/19/2015 7:15:33 PM      MDM   Final diagnoses:  Meniere's disease, unspecified laterality   patient is 75 year old male presenting with episodic dizziness. During these episodes he has nausea and vomiting. Otherwise able to eat normally. Made worse by turning his head to the right or lying down or sitting up.  It is not constant therefore less concern for posterior circulation, stroke. Patient does have tinnitus in association with the episodic dizziness. No recent URI. Concern today for Mnire's disease. We'll give him meclizine to help at home. We'll have him have tight follow-up with ENT.  Given these aspects of his dizziness I think it represents peripheral.   For his chronic glaucoma, we will encourage him to follow up with his eye doctor as planned. Given that is not constan, I do not think that they are related.  Do not see any need for labs given his normal vital signs and ability to take by mouth as long as he is not dizzy.  Jaleel Allen Julio Alm, MD 12/19/15 1756  7:22 PM EKG shows HR of 50.  I checked past vitals and patient has had similar HR all the way back to 2011, do not think this is related to dizziness.     Kyshaun Barnette Julio Alm, MD 12/19/15 Curly Rim

## 2015-12-19 NOTE — Discharge Instructions (Signed)
We believe your dizziness is from Mnire's disease. If you have continuous dizziness please return immediately. Please return if you have headaches, unable to take anything by mouth or any other concerning symptoms such as chest pain.   Meniere Disease Meniere disease is an inner ear disorder. It causes attacks of a spinning sensation (vertigo) and ringing in the ear (tinnitus). It also causes hearing loss and a sensation of fullness or pressure in your ear.  Meniere disease is lifelong. It may get worse over time. Symptoms usually begin in one ear but may eventually affect both ears.  CAUSES Meniere disease is caused by having too much of the fluid that is in your inner ear (endolymph). When endolymph builds up in your inner ear, it affects the nerves that control balance and hearing. The reason for the endolymph buildup is not known. Possible causes include:  Allergy.  An abnormal reaction of the body's defense system (autoimmune disease).  Viral infection of the inner ear.  Head injury. RISK FACTORS  Age older than 40 years.  Family history of Meniere disease.  History of autoimmune disease.  History of migraine headaches. SIGNS AND SYMPTOMS Symptoms of Meniere disease can come and go and may last for up to 4 hours at a time. Symptoms usually start in one ear and may become more frequent and eventually involve both ears. Symptoms can include:  Fullness and pressure in your ear.  Roaring or ringing in your ear.  Vertigo and loss of balance.  Decreased hearing.  Nausea and vomiting. DIAGNOSIS Your health care provider will perform a physical exam. Tests may be done to confirm a diagnosis of Meniere disease. These tests may include:  A hearing test (audiogram).  An electronystagmogram. This tests your balance nerve (vestibular nerve).  Imaging studies, such as CT or MRI, of your inner ear. TREATMENT There is no cure for Meniere disease, but it can be managed. Management  may include:  A diet that may help relieve symptoms of Meniere disease.  Use of medicines to reduce:  Vertigo.  Nausea.  Fluid retention.  Use of an air pressure pulse generator. This is a machine that sends small pressure pulses into your ear canal.  Inner ear surgery (rare). When you experience symptoms, it can be helpful to lie down on a flat surface and focus your eyes on one object that does not move. Try to stay in that position until your symptoms go away.  HOME CARE INSTRUCTIONS   Take medicines only as directed by your health care provider.  Eat the same amount of food at the same time every day, including snacks.  Do not skip meals.  Limit the salt in your diet to 1,000 mg a day.  Avoid caffeine.  Limit alcoholic drinks to one drink a day.  Do not eat foods containing monosodium glutamate (MSG).  Drink enough fluids to keep your urine clear or pale yellow.  Do not use any tobacco products including cigarettes, chewing tobacco, or electronic cigarettes. If you need help quitting, ask your health care provider.  Find ways to reduce or avoid stress. SEEK MEDICAL CARE IF:   You have symptoms that last longer than 4 hours.  You have new or more severe symptoms. SEEK IMMEDIATE MEDICAL CARE IF:   You have been vomiting for 24 hours.  You are not able to keep fluids down.  You have chest pain or trouble breathing.   This information is not intended to replace advice given to you  by your health care provider. Make sure you discuss any questions you have with your health care provider.   Document Released: 10/15/2000 Document Revised: 11/08/2014 Document Reviewed: 10/01/2013 Elsevier Interactive Patient Education [employer].

## 2015-12-19 NOTE — ED Notes (Addendum)
Pt reports vomiting multiple times today, has had periods of vomiting over the last week. Unable to keep food and liquids in. C/o dizziness with movement

## 2015-12-19 NOTE — ED Notes (Signed)
Patient refused for me to collect labs he stated he only wants to be stuck when they put the IV in.

## 2017-10-08 DIAGNOSIS — H269 Unspecified cataract: Secondary | ICD-10-CM | POA: Diagnosis not present

## 2017-10-08 DIAGNOSIS — I10 Essential (primary) hypertension: Secondary | ICD-10-CM | POA: Diagnosis not present

## 2017-10-08 DIAGNOSIS — E785 Hyperlipidemia, unspecified: Secondary | ICD-10-CM | POA: Diagnosis not present

## 2017-12-05 DIAGNOSIS — I10 Essential (primary) hypertension: Secondary | ICD-10-CM | POA: Diagnosis not present

## 2017-12-05 DIAGNOSIS — Z7689 Persons encountering health services in other specified circumstances: Secondary | ICD-10-CM | POA: Diagnosis not present

## 2017-12-05 DIAGNOSIS — E782 Mixed hyperlipidemia: Secondary | ICD-10-CM | POA: Diagnosis not present

## 2018-04-11 DIAGNOSIS — H401133 Primary open-angle glaucoma, bilateral, severe stage: Secondary | ICD-10-CM | POA: Diagnosis not present

## 2018-04-11 DIAGNOSIS — H47233 Glaucomatous optic atrophy, bilateral: Secondary | ICD-10-CM | POA: Diagnosis not present

## 2018-04-11 DIAGNOSIS — H2513 Age-related nuclear cataract, bilateral: Secondary | ICD-10-CM | POA: Diagnosis not present

## 2018-05-09 DIAGNOSIS — Z125 Encounter for screening for malignant neoplasm of prostate: Secondary | ICD-10-CM | POA: Diagnosis not present

## 2018-05-09 DIAGNOSIS — R109 Unspecified abdominal pain: Secondary | ICD-10-CM | POA: Diagnosis not present

## 2018-05-09 DIAGNOSIS — R634 Abnormal weight loss: Secondary | ICD-10-CM | POA: Diagnosis not present

## 2018-05-11 ENCOUNTER — Encounter: Payer: Self-pay | Admitting: *Deleted

## 2018-05-11 NOTE — Progress Notes (Signed)
Oncology Nurse Navigator Documentation  Oncology Nurse Navigator Flowsheets 05/11/2018  Navigator Location [facility]  Referral date to RadOnc/MedOnc 05/11/2018  Navigator Encounter Type Other/I received referral on Mr. Valenza.  I updated Dr. Julien Nordmann and notified new patient coordinator to call patient and schedule for 05/16/18 at 11:45  Treatment Phase Abnormal Scans  Barriers/Navigation Needs Coordination of Care  Interventions Coordination of Care  Coordination of Care Other  Acuity Level 2  Time Spent with Patient 30

## 2018-05-15 ENCOUNTER — Telehealth: Payer: Self-pay | Admitting: Internal Medicine

## 2018-05-15 NOTE — Telephone Encounter (Signed)
I spoke to [place] from [facility] to see if she can reach the pt. I've been unable to contact the pt. Fraser Din provided the pt' wife's number.

## 2018-05-15 NOTE — Telephone Encounter (Signed)
Spoke to [employer] from [facility] and provided an appt date and time for the pt to see Dr. Julien Nordmann. Pt has been scheduled to see Dr. Julien Nordmann on 7/16 at 11:15am for labs and 11:45am to see provider. Fraser Din has given the pt the appt date and time.

## 2018-05-16 ENCOUNTER — Encounter: Payer: Self-pay | Admitting: Internal Medicine

## 2018-05-16 ENCOUNTER — Inpatient Hospital Stay (HOSPITAL_BASED_OUTPATIENT_CLINIC_OR_DEPARTMENT_OTHER): Payer: 59 | Admitting: Internal Medicine

## 2018-05-16 ENCOUNTER — Other Ambulatory Visit: Payer: Self-pay | Admitting: Medical Oncology

## 2018-05-16 ENCOUNTER — Inpatient Hospital Stay: Payer: 59 | Attending: Internal Medicine

## 2018-05-16 ENCOUNTER — Telehealth: Payer: Self-pay | Admitting: Internal Medicine

## 2018-05-16 VITALS — BP 124/73 | HR 85 | Temp 97.9°F | Resp 18 | Ht 71.0 in | Wt 156.4 lb

## 2018-05-16 DIAGNOSIS — E785 Hyperlipidemia, unspecified: Secondary | ICD-10-CM | POA: Insufficient documentation

## 2018-05-16 DIAGNOSIS — R630 Anorexia: Secondary | ICD-10-CM | POA: Insufficient documentation

## 2018-05-16 DIAGNOSIS — E46 Unspecified protein-calorie malnutrition: Secondary | ICD-10-CM

## 2018-05-16 DIAGNOSIS — Z8041 Family history of malignant neoplasm of ovary: Secondary | ICD-10-CM | POA: Insufficient documentation

## 2018-05-16 DIAGNOSIS — R918 Other nonspecific abnormal finding of lung field: Secondary | ICD-10-CM

## 2018-05-16 DIAGNOSIS — C7802 Secondary malignant neoplasm of left lung: Secondary | ICD-10-CM | POA: Insufficient documentation

## 2018-05-16 DIAGNOSIS — Z87891 Personal history of nicotine dependence: Secondary | ICD-10-CM

## 2018-05-16 DIAGNOSIS — Z7982 Long term (current) use of aspirin: Secondary | ICD-10-CM | POA: Diagnosis not present

## 2018-05-16 DIAGNOSIS — R911 Solitary pulmonary nodule: Secondary | ICD-10-CM

## 2018-05-16 DIAGNOSIS — C801 Malignant (primary) neoplasm, unspecified: Secondary | ICD-10-CM | POA: Insufficient documentation

## 2018-05-16 DIAGNOSIS — R634 Abnormal weight loss: Secondary | ICD-10-CM | POA: Diagnosis not present

## 2018-05-16 DIAGNOSIS — C7970 Secondary malignant neoplasm of unspecified adrenal gland: Secondary | ICD-10-CM | POA: Insufficient documentation

## 2018-05-16 DIAGNOSIS — M25511 Pain in right shoulder: Secondary | ICD-10-CM | POA: Insufficient documentation

## 2018-05-16 DIAGNOSIS — R0609 Other forms of dyspnea: Secondary | ICD-10-CM | POA: Diagnosis not present

## 2018-05-16 DIAGNOSIS — R05 Cough: Secondary | ICD-10-CM | POA: Diagnosis not present

## 2018-05-16 DIAGNOSIS — J392 Other diseases of pharynx: Secondary | ICD-10-CM

## 2018-05-16 DIAGNOSIS — C7801 Secondary malignant neoplasm of right lung: Secondary | ICD-10-CM | POA: Diagnosis not present

## 2018-05-16 DIAGNOSIS — C787 Secondary malignant neoplasm of liver and intrahepatic bile duct: Secondary | ICD-10-CM | POA: Diagnosis not present

## 2018-05-16 DIAGNOSIS — I10 Essential (primary) hypertension: Secondary | ICD-10-CM

## 2018-05-16 DIAGNOSIS — Z79899 Other long term (current) drug therapy: Secondary | ICD-10-CM | POA: Insufficient documentation

## 2018-05-16 LAB — CBC WITH DIFFERENTIAL (CANCER CENTER ONLY)
BASOS ABS: 0.1 10*3/uL (ref 0.0–0.1)
BASOS PCT: 1 %
EOS PCT: 2 %
Eosinophils Absolute: 0.2 10*3/uL (ref 0.0–0.5)
HCT: 31.9 % — ABNORMAL LOW (ref 38.4–49.9)
Hemoglobin: 10.3 g/dL — ABNORMAL LOW (ref 13.0–17.1)
LYMPHS PCT: 9 %
Lymphs Abs: 0.9 10*3/uL (ref 0.9–3.3)
MCH: 24 pg — ABNORMAL LOW (ref 27.2–33.4)
MCHC: 32.3 g/dL (ref 32.0–36.0)
MCV: 74.2 fL — ABNORMAL LOW (ref 79.3–98.0)
Monocytes Absolute: 0.5 10*3/uL (ref 0.1–0.9)
Monocytes Relative: 5 %
NEUTROS ABS: 7.7 10*3/uL — AB (ref 1.5–6.5)
Neutrophils Relative %: 83 %
PLATELETS: 381 10*3/uL (ref 140–400)
RBC: 4.29 MIL/uL (ref 4.20–5.82)
RDW: 15.3 % — ABNORMAL HIGH (ref 11.0–14.6)
WBC: 9.3 10*3/uL (ref 4.0–10.3)

## 2018-05-16 NOTE — Telephone Encounter (Signed)
Gave patient avs report and appointments for July. Central radiology will call re scan.

## 2018-05-16 NOTE — Progress Notes (Signed)
[facility] [phone]   [phone]  CONSULT NOTE  REFERRING PHYSICIAN: Dr. Janie Morning  REASON FOR CONSULTATION:  77 years old African-American male with bilateral pulmonary nodules.  HPI Zachary Hobbs is a 77 y.o. male with past medical history significant for hypertension and dyslipidemia as well as history of heavy smoking but quit many years ago.  The patient has been complaining of significant weight loss over 30 pounds in the last few weeks.  He was seen by his primary care physician and chest x-ray was performed and that showed bilateral pulmonary nodules. He was referred to the clinic today for further evaluation and recommendation regarding his weight loss and the new pulmonary nodules in the chest x-ray. When seen today the patient is feeling fine except for the persistent weight loss and lack of appetite.  He also has shortness of breath with exertion but no significant chest pain, cough or hemoptysis.  He denied having any fever or chills.  He has no nausea, vomiting, diarrhea or constipation.  He has no headache or visual changes. Family history significant for mother died at 44 from old age, father has unknown medical history.  Sister had ovarian cancer. The patient is recently married and he has 4 children.  He was accompanied today by his wife Judeen Hammans and his daughter Siri Cole. He used to work and [employer].  He has a history for smoking up to 4 back spray a day for around 25 years and quit in 1970s.  He also has a history of alcohol abuse but not recently and no history of drug abuse.  HPI  Past Medical History:  Diagnosis Date  . Hypertension     Past Surgical History:  Procedure Laterality Date  . COLONOSCOPY      No family history on file.  Social History Social History   Tobacco Use  . Smoking status: Never Smoker  Substance Use Topics  . Alcohol use: No  . Drug use: No    Allergies  Allergen Reactions  .  Rosuvastatin     REACTION: myalgia    Current Outpatient Medications  Medication Sig Dispense Refill  . aspirin EC 81 MG tablet Take 81 mg by mouth daily.    . meclizine (ANTIVERT) 50 MG tablet Take 1 tablet (50 mg total) by mouth 3 (three) times daily as needed. 30 tablet 0  . omeprazole (PRILOSEC OTC) 20 MG tablet Take 20 mg by mouth daily.    . ondansetron (ZOFRAN) 4 MG tablet Take 1 tablet (4 mg total) by mouth every 8 (eight) hours as needed for nausea or vomiting. 11 tablet 0  . simvastatin (ZOCOR) 40 MG tablet Take 40 mg by mouth every evening.    . valsartan-hydrochlorothiazide (DIOVAN-HCT) 320-25 MG tablet Take 1 tablet by mouth daily.     No current facility-administered medications for this visit.     Review of Systems  Constitutional: positive for anorexia, fatigue and weight loss Eyes: negative Ears, nose, mouth, throat, and face: negative Respiratory: positive for dyspnea on exertion Cardiovascular: negative Gastrointestinal: negative Genitourinary:negative Integument/breast: negative Hematologic/lymphatic: negative Musculoskeletal:negative Neurological: negative Behavioral/Psych: negative Endocrine: negative Allergic/Immunologic: negative  Physical Exam  LGX:QJJHE, healthy, no distress and malnourished SKIN: skin color, texture, turgor are normal, no rashes or significant lesions HEAD: Normocephalic, No masses, lesions, tenderness or abnormalities EYES: normal, PERRLA, Conjunctiva are pink and non-injected EARS: External ears normal, Canals clear OROPHARYNX:no exudate, no erythema and lips, buccal mucosa, and tongue normal  NECK: supple, no adenopathy, no JVD LYMPH:  no palpable lymphadenopathy, no hepatosplenomegaly LUNGS: clear to auscultation , and palpation HEART: regular rate & rhythm, no gallops and harsh systolic murmur ABDOMEN:abdomen soft, non-tender, normal bowel sounds and no masses or organomegaly BACK: Back symmetric, no curvature., No CVA  tenderness EXTREMITIES:no joint deformities, effusion, or inflammation, no edema  NEURO: alert & oriented x 3 with fluent speech, no focal motor/sensory deficits  PERFORMANCE STATUS: ECOG 1  LABORATORY DATA: Lab Results  Component Value Date   WBC 9.3 05/16/2018   HGB 10.3 (L) 05/16/2018   HCT 31.9 (L) 05/16/2018   MCV 74.2 (L) 05/16/2018   PLT 381 05/16/2018      Chemistry      Component Value Date/Time   NA 142 02/14/2012 0732   K 3.6 02/14/2012 0732   CL 108 02/14/2012 0732   CO2 23 02/14/2012 0732   BUN 21 02/14/2012 0732   CREATININE 1.05 02/14/2012 0732      Component Value Date/Time   CALCIUM 8.5 02/14/2012 0732   ALKPHOS 89 02/12/2012 1315   AST 14 02/12/2012 1315   ALT 14 02/12/2012 1315   BILITOT 0.8 02/12/2012 1315       RADIOGRAPHIC STUDIES: No results found.  ASSESSMENT: This is a very pleasant 77 years old African-American male with recent significant weight loss and pulmonary nodule seen on recent chest x-ray.  This is concerning for an occult malignancy until proven otherwise.   PLAN: I had a lengthy discussion with the patient and his family about his condition. It is unclear if the pulmonary nodules are from lung cancer or from other malignancy with metastasis to the lung. I recommended for the patient to have CT scan of the chest, abdomen and pelvis for evaluation of his condition and try to identify the primary etiology of the bilateral pulmonary nodules. I will see the patient back for follow-up visit in 2 weeks for reevaluation and discussion of his discuss results and further recommendation regarding his condition. For the weight loss and malnutrition, I strongly encouraged the patient to increase his oral intake and also use nutritional supplements. He was advised to call immediately if he has any concerning symptoms in the interval. The patient voices understanding of current disease status and treatment options and is in agreement with the  current care plan.  All questions were answered. The patient knows to call the clinic with any problems, questions or concerns. We can certainly see the patient much sooner if necessary.  Thank you so much for allowing me to participate in the care of Zachary Hobbs. I will continue to follow up the patient with you and assist in his care.  I spent 40 minutes counseling the patient face to face. The total time spent in the appointment was 60 minutes.  Disclaimer: This note was dictated with voice recognition software. Similar sounding words can inadvertently be transcribed and may not be corrected upon review.   Eilleen Kempf May 16, 2018, 11:49 AM

## 2018-05-16 NOTE — Progress Notes (Signed)
cmet hemolyzed today . reordered for 7/23.

## 2018-05-18 ENCOUNTER — Emergency Department (HOSPITAL_COMMUNITY)
Admission: EM | Admit: 2018-05-18 | Discharge: 2018-05-18 | Disposition: A | Discharge status: Home / Self Care | Payer: 59 | Attending: Emergency Medicine | Admitting: Emergency Medicine

## 2018-05-18 ENCOUNTER — Telehealth: Payer: Self-pay | Admitting: *Deleted

## 2018-05-18 ENCOUNTER — Other Ambulatory Visit: Payer: Self-pay

## 2018-05-18 ENCOUNTER — Emergency Department (HOSPITAL_COMMUNITY): Payer: 59

## 2018-05-18 ENCOUNTER — Encounter (HOSPITAL_COMMUNITY): Payer: Self-pay | Admitting: Emergency Medicine

## 2018-05-18 DIAGNOSIS — C799 Secondary malignant neoplasm of unspecified site: Secondary | ICD-10-CM | POA: Diagnosis not present

## 2018-05-18 DIAGNOSIS — Z79899 Other long term (current) drug therapy: Secondary | ICD-10-CM | POA: Insufficient documentation

## 2018-05-18 DIAGNOSIS — C7800 Secondary malignant neoplasm of unspecified lung: Secondary | ICD-10-CM | POA: Diagnosis not present

## 2018-05-18 DIAGNOSIS — I10 Essential (primary) hypertension: Secondary | ICD-10-CM | POA: Diagnosis not present

## 2018-05-18 DIAGNOSIS — K7689 Other specified diseases of liver: Secondary | ICD-10-CM | POA: Diagnosis not present

## 2018-05-18 DIAGNOSIS — Z7982 Long term (current) use of aspirin: Secondary | ICD-10-CM | POA: Diagnosis not present

## 2018-05-18 DIAGNOSIS — N289 Disorder of kidney and ureter, unspecified: Secondary | ICD-10-CM | POA: Insufficient documentation

## 2018-05-18 DIAGNOSIS — R079 Chest pain, unspecified: Secondary | ICD-10-CM | POA: Diagnosis not present

## 2018-05-18 DIAGNOSIS — M542 Cervicalgia: Secondary | ICD-10-CM | POA: Diagnosis not present

## 2018-05-18 DIAGNOSIS — R042 Hemoptysis: Secondary | ICD-10-CM | POA: Diagnosis not present

## 2018-05-18 DIAGNOSIS — R918 Other nonspecific abnormal finding of lung field: Secondary | ICD-10-CM | POA: Diagnosis not present

## 2018-05-18 LAB — TYPE AND SCREEN
ABO/RH(D): AB POS
Antibody Screen: NEGATIVE

## 2018-05-18 LAB — COMPREHENSIVE METABOLIC PANEL
ALT: 54 U/L — ABNORMAL HIGH (ref 0–44)
ANION GAP: 12 (ref 5–15)
AST: 31 U/L (ref 15–41)
Albumin: 2.9 g/dL — ABNORMAL LOW (ref 3.5–5.0)
Alkaline Phosphatase: 113 U/L (ref 38–126)
BUN: 40 mg/dL — AB (ref 8–23)
CHLORIDE: 104 mmol/L (ref 98–111)
CO2: 25 mmol/L (ref 22–32)
Calcium: 9.6 mg/dL (ref 8.9–10.3)
Creatinine, Ser: 1.94 mg/dL — ABNORMAL HIGH (ref 0.61–1.24)
GFR calc Af Amer: 37 mL/min — ABNORMAL LOW (ref >60)
GFR, EST NON AFRICAN AMERICAN: 32 mL/min — AB (ref >60)
Glucose, Bld: 100 mg/dL — ABNORMAL HIGH (ref 70–99)
POTASSIUM: 4.8 mmol/L (ref 3.5–5.1)
Sodium: 141 mmol/L (ref 135–145)
Total Bilirubin: 0.7 mg/dL (ref 0.3–1.2)
Total Protein: 7.9 g/dL (ref 6.5–8.1)

## 2018-05-18 LAB — CBC WITH DIFFERENTIAL/PLATELET
Basophils Absolute: 0 10*3/uL (ref 0.0–0.1)
Basophils Relative: 0 %
EOS PCT: 2 %
Eosinophils Absolute: 0.2 10*3/uL (ref 0.0–0.7)
HCT: 30.1 % — ABNORMAL LOW (ref 39.0–52.0)
Hemoglobin: 9.6 g/dL — ABNORMAL LOW (ref 13.0–17.0)
LYMPHS ABS: 1.1 10*3/uL (ref 0.7–4.0)
Lymphocytes Relative: 12 %
MCH: 23.9 pg — ABNORMAL LOW (ref 26.0–34.0)
MCHC: 31.9 g/dL (ref 30.0–36.0)
MCV: 74.9 fL — AB (ref 78.0–100.0)
MONO ABS: 1.1 10*3/uL — AB (ref 0.1–1.0)
Monocytes Relative: 12 %
Neutro Abs: 7 10*3/uL (ref 1.7–7.7)
Neutrophils Relative %: 74 %
PLATELETS: 384 10*3/uL (ref 150–400)
RBC: 4.02 MIL/uL — AB (ref 4.22–5.81)
RDW: 14.6 % (ref 11.5–15.5)
WBC: 9.4 10*3/uL (ref 4.0–10.5)

## 2018-05-18 LAB — PROTIME-INR
INR: 1.21
PROTHROMBIN TIME: 15.2 s (ref 11.4–15.2)

## 2018-05-18 MED ORDER — IOPAMIDOL (ISOVUE-370) INJECTION 76%
INTRAVENOUS | Status: AC
  Filled 2018-05-18: qty 100

## 2018-05-18 MED ORDER — OXYCODONE-ACETAMINOPHEN 5-325 MG PO TABS: 1.0000 | ORAL_TABLET | ORAL | 0 refills | Status: DC | PRN

## 2018-05-18 MED ORDER — IOPAMIDOL (ISOVUE-370) INJECTION 76%
80.0000 mL | Freq: Once | INTRAVENOUS | Status: AC | PRN
  Administered 2018-05-18: 80 mL via INTRAVENOUS

## 2018-05-18 NOTE — ED Provider Notes (Signed)
[facility] DEPT Provider Note   CSN: [id number] Arrival date & time: 05/18/18  1105     History   Chief Complaint Chief Complaint  Patient presents with  . Cough    coughing up blood    HPI Zachary Hobbs is a 77 y.o. male.  HPI Patient is a 77 year old male who has had some blood in his sputum over the past month.  He said 30 pound weight loss.  He has seen oncology and is scheduled for CT imaging.  He has pulmonary nodules on his last chest x-ray.  Presentation is concerning for lung cancer but he has not had any formal tissue sampling at this time.  Called because his family wanted him seen for the hemoptysis and he was referred to the emergency department from the oncology office today.  He states his had poor appetite.  He otherwise feels okay.  He does feel short of breath when he exerts himself.  He is no longer able to play golf over the past 2 months   Past Medical History:  Diagnosis Date  . Hypertension     Patient Active Problem List   Diagnosis Date Noted  . Abscess, peritonsillar 02/12/2012  . Acute respiratory failure with hypoxia ([place]) 02/12/2012  . Hyperglycemia 02/12/2012  . ERECTILE DYSFUNCTION 12/10/2010  . HEADACHE 08/11/2010  . COLONIC POLYPS, ADENOMATOUS 05/18/2010  . ANEMIA, SECONDARY TO ACUTE BLOOD LOSS 05/18/2010  . COLONIC POLYPS, ADENOMATOUS, HX OF 05/18/2010  . ERECTILE DYSFUNCTION, ORGANIC 05/06/2010  . HYPERLIPIDEMIA 02/18/2010  . ANXIETY 02/18/2010  . GLAUCOMA 02/18/2010  . Essential hypertension 02/18/2010    Past Surgical History:  Procedure Laterality Date  . COLONOSCOPY          Home Medications    Prior to Admission medications   Medication Sig Start Date End Date Taking? Authorizing Provider  aspirin EC 81 MG tablet Take 81 mg by mouth daily.   Yes [provider]  meclizine (ANTIVERT) 50 MG tablet Take 1 tablet (50 mg total) by mouth 3 (three) times daily as needed. 12/19/15  Yes  Mackuen, Courteney Lyn, MD  omeprazole (PRILOSEC OTC) 20 MG tablet Take 20 mg by mouth daily.   Yes [provider]  ondansetron (ZOFRAN) 4 MG tablet Take 1 tablet (4 mg total) by mouth every 8 (eight) hours as needed for nausea or vomiting. 12/19/15  Yes Mackuen, Courteney Lyn, MD  simvastatin (ZOCOR) 40 MG tablet Take 40 mg by mouth every evening.   Yes [provider]  traMADol (ULTRAM) 50 MG tablet Take 25 mg by mouth 3 (three) times daily as needed. 05/15/18  Yes [provider]  valsartan-hydrochlorothiazide (DIOVAN-HCT) 320-25 MG tablet Take 1 tablet by mouth daily. 12/05/15  Yes [provider]  oxyCODONE-acetaminophen (PERCOCET/ROXICET) 5-325 MG tablet Take 1 tablet by mouth every 4 (four) hours as needed for severe pain. 05/18/18   Jola Schmidt, MD    Family History No family history on file.  Social History Social History   Tobacco Use  . Smoking status: Never Smoker  Substance Use Topics  . Alcohol use: No  . Drug use: No     Allergies   Rosuvastatin   Review of Systems Review of Systems  All other systems reviewed and are negative.    Physical Exam Updated Vital Signs BP 105/62   Pulse 74   Temp (!) 97.4 F (36.3 C) (Oral)   Resp 17   Ht 5' 8.75" (1.746 m)  Wt 69.9 kg (154 lb)   SpO2 94%   BMI 22.91 kg/m   Physical Exam  Constitutional: He is oriented to person, place, and time. He appears well-developed and well-nourished.  HENT:  Head: Normocephalic and atraumatic.  Posterior pharynx is normal  Eyes: EOM are normal.  Neck: Normal range of motion.  Cardiovascular: Normal rate, regular rhythm, normal heart sounds and intact distal pulses.  Pulmonary/Chest: Effort normal and breath sounds normal. No respiratory distress.  Abdominal: Soft. He exhibits no distension. There is no tenderness.  Musculoskeletal: Normal range of motion.  Neurological: He is alert and oriented to person, place, and time.  Skin: Skin is  warm and dry.  Psychiatric: He has a normal mood and affect. Judgment normal.  Nursing note and vitals reviewed.    ED Treatments / Results  Labs (all labs ordered are listed, but only abnormal results are displayed) Labs Reviewed  CBC WITH DIFFERENTIAL/PLATELET - Abnormal; Notable for the following components:      Result Value   RBC 4.02 (*)    Hemoglobin 9.6 (*)    HCT 30.1 (*)    MCV 74.9 (*)    MCH 23.9 (*)    Monocytes Absolute 1.1 (*)    All other components within normal limits  COMPREHENSIVE METABOLIC PANEL - Abnormal; Notable for the following components:   Glucose, Bld 100 (*)    BUN 40 (*)    Creatinine, Ser 1.94 (*)    Albumin 2.9 (*)    ALT 54 (*)    GFR calc non Af Amer 32 (*)    GFR calc Af Amer 37 (*)    All other components within normal limits  PROTIME-INR  TYPE AND SCREEN    EKG None  Radiology Ct Soft Tissue Neck W Contrast  Result Date: 05/18/2018 CLINICAL DATA:  Weight loss. EXAM: CT NECK WITH CONTRAST TECHNIQUE: Multidetector CT imaging of the neck was performed using the standard protocol following the bolus administration of intravenous contrast. CONTRAST:  1mL ISOVUE-370 IOPAMIDOL (ISOVUE-370) INJECTION 76% COMPARISON:  CT chest, abdomen, pelvis reported separately. FINDINGS: Pharynx and larynx: Normal. No mass or swelling. Salivary glands: No inflammation, mass, or stone. Thyroid: Normal. Lymph nodes: None enlarged or abnormal density. Vascular: Atherosclerosis. Limited intracranial: Not assessed. Visualized orbits: Partially visualized, negative. Mastoids and visualized paranasal sinuses: Clear. Skeleton: Cervical spondylosis. Periapical lucencies suggesting dental disease. Upper chest: Reported separately.  Multiple masses are identified. Other: None. IMPRESSION: No visible head/neck primary or malignant appearing cervical adenopathy. Carotid bifurcation atherosclerosis. Cervical spondylosis, and dental disease. Electronically Signed   By: Staci Righter M.D.   On: 05/18/2018 13:28   Ct Angio Chest Pe W And/or Wo Contrast  Result Date: 05/18/2018 CLINICAL DATA:  Significant weight loss. Smoking history. Abnormal chest radiograph EXAM: CT ANGIOGRAPHY CHEST CT ABDOMEN AND PELVIS WITH CONTRAST TECHNIQUE: Multidetector CT imaging of the chest was performed using the standard protocol during bolus administration of intravenous contrast. Multiplanar CT image reconstructions and MIPs were obtained to evaluate the vascular anatomy. Multidetector CT imaging of the abdomen and pelvis was performed using the standard protocol during bolus administration of intravenous contrast. CONTRAST:  48mL ISOVUE-370 IOPAMIDOL (ISOVUE-370) INJECTION 76% COMPARISON:  Chest radiograph 05/18/2018 FINDINGS: CTA CHEST FINDINGS Cardiovascular: No filling defects within the pulmonary arteries to suggest acute pulmonary embolism. No significant vascular findings. Normal heart size. No pericardial effusion. Mediastinum/Nodes: No axillary or supraclavicular adenopathy. Bilateral hilar nodal enlargement with 1.5 cm hilar nodes. Esophagus normal. Lungs/Pleura: Innumerable bilateral pulmonary  nodules of varying size ranging from 5 mm to 20 mm. Approximately 15 nodules per lung. There is a pleural base mass in the LEFT upper lobe laterally measuring 53 x 24 mm. There is osseous invasion of adjacent LEFT third fourth ribs anteriorly (image 44/8 adjacent to this pleural base mass per Musculoskeletal: Chest wall invasion in the anterior LEFT chest involving the anterior aspect of the third and fourth ribs with mottled appearance of the ribs and pathologic fracture medially in the third rib. This is just superior to the pleural-based LEFT upper lobe mass. Review of the MIP images confirms the above findings. CT ABDOMEN and PELVIS FINDINGS Hepatobiliary: Dilute density well-circumscribed ovoid lesion in the superior aspect of the LEFT hepatic lobe (segment 4A) measures 3.2 x 2.4 cm. Tiny 2 mm  lesion inferior RIGHT hepatic lobe (image 30/4 normal gallbladder. Pancreas: Pancreas is normal. No ductal dilatation. No pancreatic inflammation. Spleen: Normal spleen Adrenals/urinary tract: There is nodule enlargement of the LEFT adrenal gland to 19 x 25 mm mild thickening of the RIGHT adrenal gland without discrete nodularity kidneys normal. Ureters and bladder normal Stomach/Bowel: Stomach, small-bowel cecum normal. Ascending, transverse, and descending colon normal. Rectosigmoid colon normal. No obstruction or mass. Vascular/Lymphatic: Abdominal aorta is normal caliber with atherosclerotic calcification. There is no retroperitoneal or periportal lymphadenopathy. No pelvic lymphadenopathy. Reproductive: Prostate normal Other: No peritoneal metastasis or omental thickening Musculoskeletal: No aggressive osseous lesion. Review of the MIP images confirms the above findings. IMPRESSION: Chest Impression: 1. No acute pulmonary embolism. 2. Bilateral pulmonary metastasis with innumerable nodules in both lungs. 3. Pleural base mass in the LEFT upper lobe could represent a metastatic deposit or potential primary lesion. 4. LEFT chest wall invasion adjacent to pleural base mass involving the third and fourth ribs anteriorly. Abdomen / Pelvis Impression: 1. Solitary low-density lesion in the LEFT hepatic lobe is concerning for hepatic metastasis. 2. LEFT adrenal gland metastasis. 3. No evidence of colorectal primary. This pattern of metastatic disease is suggestive of melanoma or small cell lung carcinoma. Recommend tissue sampling Electronically Signed   By: Suzy Bouchard M.D.   On: 05/18/2018 13:50   Ct Abdomen Pelvis W Contrast  Result Date: 05/18/2018 CLINICAL DATA:  Significant weight loss. Smoking history. Abnormal chest radiograph EXAM: CT ANGIOGRAPHY CHEST CT ABDOMEN AND PELVIS WITH CONTRAST TECHNIQUE: Multidetector CT imaging of the chest was performed using the standard protocol during bolus  administration of intravenous contrast. Multiplanar CT image reconstructions and MIPs were obtained to evaluate the vascular anatomy. Multidetector CT imaging of the abdomen and pelvis was performed using the standard protocol during bolus administration of intravenous contrast. CONTRAST:  74mL ISOVUE-370 IOPAMIDOL (ISOVUE-370) INJECTION 76% COMPARISON:  Chest radiograph 05/18/2018 FINDINGS: CTA CHEST FINDINGS Cardiovascular: No filling defects within the pulmonary arteries to suggest acute pulmonary embolism. No significant vascular findings. Normal heart size. No pericardial effusion. Mediastinum/Nodes: No axillary or supraclavicular adenopathy. Bilateral hilar nodal enlargement with 1.5 cm hilar nodes. Esophagus normal. Lungs/Pleura: Innumerable bilateral pulmonary nodules of varying size ranging from 5 mm to 20 mm. Approximately 15 nodules per lung. There is a pleural base mass in the LEFT upper lobe laterally measuring 53 x 24 mm. There is osseous invasion of adjacent LEFT third fourth ribs anteriorly (image 44/8 adjacent to this pleural base mass per Musculoskeletal: Chest wall invasion in the anterior LEFT chest involving the anterior aspect of the third and fourth ribs with mottled appearance of the ribs and pathologic fracture medially in the third rib. This is just  superior to the pleural-based LEFT upper lobe mass. Review of the MIP images confirms the above findings. CT ABDOMEN and PELVIS FINDINGS Hepatobiliary: Dilute density well-circumscribed ovoid lesion in the superior aspect of the LEFT hepatic lobe (segment 4A) measures 3.2 x 2.4 cm. Tiny 2 mm lesion inferior RIGHT hepatic lobe (image 30/4 normal gallbladder. Pancreas: Pancreas is normal. No ductal dilatation. No pancreatic inflammation. Spleen: Normal spleen Adrenals/urinary tract: There is nodule enlargement of the LEFT adrenal gland to 19 x 25 mm mild thickening of the RIGHT adrenal gland without discrete nodularity kidneys normal. Ureters and  bladder normal Stomach/Bowel: Stomach, small-bowel cecum normal. Ascending, transverse, and descending colon normal. Rectosigmoid colon normal. No obstruction or mass. Vascular/Lymphatic: Abdominal aorta is normal caliber with atherosclerotic calcification. There is no retroperitoneal or periportal lymphadenopathy. No pelvic lymphadenopathy. Reproductive: Prostate normal Other: No peritoneal metastasis or omental thickening Musculoskeletal: No aggressive osseous lesion. Review of the MIP images confirms the above findings. IMPRESSION: Chest Impression: 1. No acute pulmonary embolism. 2. Bilateral pulmonary metastasis with innumerable nodules in both lungs. 3. Pleural base mass in the LEFT upper lobe could represent a metastatic deposit or potential primary lesion. 4. LEFT chest wall invasion adjacent to pleural base mass involving the third and fourth ribs anteriorly. Abdomen / Pelvis Impression: 1. Solitary low-density lesion in the LEFT hepatic lobe is concerning for hepatic metastasis. 2. LEFT adrenal gland metastasis. 3. No evidence of colorectal primary. This pattern of metastatic disease is suggestive of melanoma or small cell lung carcinoma. Recommend tissue sampling Electronically Signed   By: Suzy Bouchard M.D.   On: 05/18/2018 13:50   Dg Chest Portable 1 View  Result Date: 05/18/2018 CLINICAL DATA:  Chest pain. EXAM: PORTABLE CHEST 1 VIEW COMPARISON:  02/13/2012. FINDINGS: Mediastinum and hilar structures are normal. Cardiomegaly with normal pulmonary vascularity. Multiple bilateral pulmonary nodules are noted. These are worrisome for metastatic lesions. Large pleural-based densities are noted along the left upper pleural wall. These could also be metastatic. These findings can also be caused by infectious etiologies. Chest CT suggested for further evaluation. No pneumothorax. No acute bony abnormality. IMPRESSION: 1.  Multiple pulmonary nodules worrisome for metastatic disease. 2. Large  pleural-based densities noted along the left upper pleural border. These could also be secondary to metastatic disease. IV contrast-enhanced chest CT suggested for further evaluation. Electronically Signed   By: Marcello Moores  Register   On: 05/18/2018 13:18    Procedures Procedures (including critical care time)  Medications Ordered in ED Medications  iopamidol (ISOVUE-370) 76 % injection (has no administration in time range)  iopamidol (ISOVUE-370) 76 % injection 80 mL (80 mLs Intravenous Contrast Given 05/18/18 1253)     Initial Impression / Assessment and Plan / ED Course  I have reviewed the triage vital signs and the nursing notes.  Pertinent labs & imaging results that were available during my care of the patient were reviewed by me and considered in my medical decision making (see chart for details).     Patient with evidence of metastatic disease and a large left pleural-based mass which likely will be the best opportunity for tissue sampling.  His work-up in the emergency department is otherwise without significant abnormality except for mild renal insufficiency likely secondary to volume depletion.  He has had no hemoptysis here in the emergency department.  His vital signs are stable.  I do not think he needs acute hospitalization at this time.  He is already involved with oncology.  He will need additional follow-up and  tissue sampling.  Patient and patient's family were shown CT images to further explain his pathology.  Patient and family are encouraged to return to the emergency department for new or worsening symptoms  Final Clinical Impressions(s) / ED Diagnoses   Final diagnoses:  Metastatic cancer ([facility])  Cough with hemoptysis    ED Discharge Orders        Ordered    oxyCODONE-acetaminophen (PERCOCET/ROXICET) 5-325 MG tablet  Every 4 hours PRN     05/18/18 1432       Jola Schmidt, MD 05/18/18 1436

## 2018-05-18 NOTE — Discharge Instructions (Addendum)
Please follow up with your oncologist  You will need tissue sampling, likely of the left upper lobe pleural based mass

## 2018-05-18 NOTE — ED Triage Notes (Signed)
7/16 started coughing up blood. Has not stopped since

## 2018-05-18 NOTE — Telephone Encounter (Signed)
Received call from Mr Zachary Hobbs's family stating he was in "a lot of pain" and had been "coughing up a lot of blood- he was trying to hide it". They were planning to take him to ED- reassured that is the right thing to do.

## 2018-05-22 ENCOUNTER — Ambulatory Visit (HOSPITAL_COMMUNITY): Payer: 59

## 2018-05-24 ENCOUNTER — Inpatient Hospital Stay (HOSPITAL_BASED_OUTPATIENT_CLINIC_OR_DEPARTMENT_OTHER): Payer: 59 | Admitting: Internal Medicine

## 2018-05-24 ENCOUNTER — Telehealth: Payer: Self-pay | Admitting: Internal Medicine

## 2018-05-24 ENCOUNTER — Encounter: Payer: Self-pay | Admitting: Internal Medicine

## 2018-05-24 VITALS — BP 141/84 | HR 98 | Temp 97.5°F | Resp 18 | Ht 68.75 in | Wt 149.9 lb

## 2018-05-24 DIAGNOSIS — C801 Malignant (primary) neoplasm, unspecified: Secondary | ICD-10-CM

## 2018-05-24 DIAGNOSIS — C7802 Secondary malignant neoplasm of left lung: Secondary | ICD-10-CM | POA: Diagnosis not present

## 2018-05-24 DIAGNOSIS — Z8041 Family history of malignant neoplasm of ovary: Secondary | ICD-10-CM

## 2018-05-24 DIAGNOSIS — C7970 Secondary malignant neoplasm of unspecified adrenal gland: Secondary | ICD-10-CM

## 2018-05-24 DIAGNOSIS — R634 Abnormal weight loss: Secondary | ICD-10-CM | POA: Diagnosis not present

## 2018-05-24 DIAGNOSIS — R0609 Other forms of dyspnea: Secondary | ICD-10-CM

## 2018-05-24 DIAGNOSIS — R05 Cough: Secondary | ICD-10-CM

## 2018-05-24 DIAGNOSIS — R630 Anorexia: Secondary | ICD-10-CM | POA: Diagnosis not present

## 2018-05-24 DIAGNOSIS — Z87891 Personal history of nicotine dependence: Secondary | ICD-10-CM

## 2018-05-24 DIAGNOSIS — C7801 Secondary malignant neoplasm of right lung: Secondary | ICD-10-CM | POA: Diagnosis not present

## 2018-05-24 DIAGNOSIS — G893 Neoplasm related pain (acute) (chronic): Secondary | ICD-10-CM

## 2018-05-24 DIAGNOSIS — R918 Other nonspecific abnormal finding of lung field: Secondary | ICD-10-CM | POA: Diagnosis not present

## 2018-05-24 DIAGNOSIS — C787 Secondary malignant neoplasm of liver and intrahepatic bile duct: Secondary | ICD-10-CM

## 2018-05-24 DIAGNOSIS — E46 Unspecified protein-calorie malnutrition: Secondary | ICD-10-CM

## 2018-05-24 DIAGNOSIS — Z7982 Long term (current) use of aspirin: Secondary | ICD-10-CM

## 2018-05-24 DIAGNOSIS — E785 Hyperlipidemia, unspecified: Secondary | ICD-10-CM

## 2018-05-24 DIAGNOSIS — Z79899 Other long term (current) drug therapy: Secondary | ICD-10-CM

## 2018-05-24 DIAGNOSIS — C3490 Malignant neoplasm of unspecified part of unspecified bronchus or lung: Secondary | ICD-10-CM

## 2018-05-24 DIAGNOSIS — I10 Essential (primary) hypertension: Secondary | ICD-10-CM

## 2018-05-24 DIAGNOSIS — M25511 Pain in right shoulder: Secondary | ICD-10-CM

## 2018-05-24 MED ORDER — OXYCODONE-ACETAMINOPHEN 5-325 MG PO TABS
ORAL_TABLET | ORAL | Status: AC
  Filled 2018-05-24: qty 1

## 2018-05-24 MED ORDER — OXYCODONE-ACETAMINOPHEN 5-325 MG PO TABS
1.0000 | ORAL_TABLET | Freq: Once | ORAL | Status: AC
  Administered 2018-05-24: 1 via ORAL

## 2018-05-24 NOTE — Telephone Encounter (Signed)
Unable to schedule per 7/24 los/ AVS printed per 7/24 los

## 2018-05-24 NOTE — Progress Notes (Signed)
[facility] Telephone:([phone]) [phone]   Fax:(336) [phone]  OFFICE PROGRESS NOTE  Janie Morning, DO [place] [address]  DIAGNOSIS: Bilateral pulmonary nodules and masses highly suspicious for metastatic lung cancer.  PRIOR THERAPY: None  CURRENT THERAPY: None  INTERVAL HISTORY: Zachary Hobbs 77 y.o. male returns to the clinic today for follow-up visit accompanied by his wife and daughter.  The patient continues to complain of increasing fatigue as well has been in the right shoulder.  He also has weight loss and mild cough.  He was seen recently at the emergency department complaining of cough with blood-tinged sputum.  During his evaluation he had CT scan of the neck, chest, abdomen and pelvis performed on 05/18/2018. Marland Kitchen  His scan showed innumerable bilateral pulmonary nodules of varying size ranging from 0.5 cm to 2.0 cm.. There was approximately 15 nodules per lung.  There was also pleural-based mass in the left upper lobe laterally measuring 5.3 x 2.4 cm.  There is osseous invasion of adjacent left third, fourth ribs anteriorly adjacent to this pleural-based mass. There was also solitary low-density lesion in the left hepatic lobe concerning for hepatic metastases as well as left adrenal gland metastasis.  There was no evidence for colorectal primary.  This pattern of metastatic disease is suggestive of melanoma or small cell carcinoma.  The patient came back to the clinic today for evaluation and recommendation regarding his condition.  He has no current fever or chills.  He has no nausea, vomiting, diarrhea or constipation.   MEDICAL HISTORY: Past Medical History:  Diagnosis Date  . Hypertension     ALLERGIES:  is allergic to rosuvastatin.  MEDICATIONS:  Current Outpatient Medications  Medication Sig Dispense Refill  . aspirin EC 81 MG tablet Take 81 mg by mouth daily.    . meclizine (ANTIVERT) 50 MG tablet Take 1 tablet (50 mg  total) by mouth 3 (three) times daily as needed. 30 tablet 0  . omeprazole (PRILOSEC OTC) 20 MG tablet Take 20 mg by mouth daily.    . ondansetron (ZOFRAN) 4 MG tablet Take 1 tablet (4 mg total) by mouth every 8 (eight) hours as needed for nausea or vomiting. 11 tablet 0  . oxyCODONE-acetaminophen (PERCOCET/ROXICET) 5-325 MG tablet Take 1 tablet by mouth every 4 (four) hours as needed for severe pain. 15 tablet 0  . simvastatin (ZOCOR) 40 MG tablet Take 40 mg by mouth every evening.    . traMADol (ULTRAM) 50 MG tablet Take 25 mg by mouth 3 (three) times daily as needed.  0  . valsartan-hydrochlorothiazide (DIOVAN-HCT) 320-25 MG tablet Take 1 tablet by mouth daily.     No current facility-administered medications for this visit.     SURGICAL HISTORY:  Past Surgical History:  Procedure Laterality Date  . COLONOSCOPY      REVIEW OF SYSTEMS:  Constitutional: positive for anorexia, fatigue and weight loss Eyes: negative Ears, nose, mouth, throat, and face: negative Respiratory: positive for cough, dyspnea on exertion, hemoptysis and sputum Cardiovascular: negative Gastrointestinal: negative Genitourinary:negative Integument/breast: negative Hematologic/lymphatic: negative Musculoskeletal:positive for bone pain Neurological: negative Behavioral/Psych: negative Endocrine: negative Allergic/Immunologic: negative   PHYSICAL EXAMINATION: General appearance: alert, cooperative, fatigued and no distress Head: Normocephalic, without obvious abnormality, atraumatic Neck: no adenopathy, no JVD, supple, symmetrical, trachea midline and thyroid not enlarged, symmetric, no tenderness/mass/nodules Lymph nodes: Cervical, supraclavicular, and axillary nodes normal. Resp: rhonchi bilaterally and wheezes bilaterally Back: symmetric, no curvature. ROM normal.  No CVA tenderness. Cardio: regular rate and rhythm, S1, S2 normal, no murmur, click, rub or gallop GI: soft, non-tender; bowel sounds normal; no  masses,  no organomegaly Extremities: extremities normal, atraumatic, no cyanosis or edema Neurologic: Alert and oriented X 3, normal strength and tone. Normal symmetric reflexes. Normal coordination and gait  ECOG PERFORMANCE STATUS: 1 - Symptomatic but completely ambulatory  Blood pressure (!) 141/84, pulse 98, temperature (!) 97.5 F (36.4 C), resp. rate 18, height 5' 8.75" (1.746 m), weight 149 lb 14.4 oz (68 kg), SpO2 97 %.  LABORATORY DATA: Lab Results  Component Value Date   WBC 9.4 05/18/2018   HGB 9.6 (L) 05/18/2018   HCT 30.1 (L) 05/18/2018   MCV 74.9 (L) 05/18/2018   PLT 384 05/18/2018      Chemistry      Component Value Date/Time   NA 141 05/18/2018 1156   K 4.8 05/18/2018 1156   CL 104 05/18/2018 1156   CO2 25 05/18/2018 1156   BUN 40 (H) 05/18/2018 1156   CREATININE 1.94 (H) 05/18/2018 1156      Component Value Date/Time   CALCIUM 9.6 05/18/2018 1156   ALKPHOS 113 05/18/2018 1156   AST 31 05/18/2018 1156   ALT 54 (H) 05/18/2018 1156   BILITOT 0.7 05/18/2018 1156       RADIOGRAPHIC STUDIES: Ct Soft Tissue Neck W Contrast  Result Date: 05/18/2018 CLINICAL DATA:  Weight loss. EXAM: CT NECK WITH CONTRAST TECHNIQUE: Multidetector CT imaging of the neck was performed using the standard protocol following the bolus administration of intravenous contrast. CONTRAST:  30mL ISOVUE-370 IOPAMIDOL (ISOVUE-370) INJECTION 76% COMPARISON:  CT chest, abdomen, pelvis reported separately. FINDINGS: Pharynx and larynx: Normal. No mass or swelling. Salivary glands: No inflammation, mass, or stone. Thyroid: Normal. Lymph nodes: None enlarged or abnormal density. Vascular: Atherosclerosis. Limited intracranial: Not assessed. Visualized orbits: Partially visualized, negative. Mastoids and visualized paranasal sinuses: Clear. Skeleton: Cervical spondylosis. Periapical lucencies suggesting dental disease. Upper chest: Reported separately.  Multiple masses are identified. Other: None.  IMPRESSION: No visible head/neck primary or malignant appearing cervical adenopathy. Carotid bifurcation atherosclerosis. Cervical spondylosis, and dental disease. Electronically Signed   By: Staci Righter M.D.   On: 05/18/2018 13:28   Ct Angio Chest Pe W And/or Wo Contrast  Result Date: 05/18/2018 CLINICAL DATA:  Significant weight loss. Smoking history. Abnormal chest radiograph EXAM: CT ANGIOGRAPHY CHEST CT ABDOMEN AND PELVIS WITH CONTRAST TECHNIQUE: Multidetector CT imaging of the chest was performed using the standard protocol during bolus administration of intravenous contrast. Multiplanar CT image reconstructions and MIPs were obtained to evaluate the vascular anatomy. Multidetector CT imaging of the abdomen and pelvis was performed using the standard protocol during bolus administration of intravenous contrast. CONTRAST:  34mL ISOVUE-370 IOPAMIDOL (ISOVUE-370) INJECTION 76% COMPARISON:  Chest radiograph 05/18/2018 FINDINGS: CTA CHEST FINDINGS Cardiovascular: No filling defects within the pulmonary arteries to suggest acute pulmonary embolism. No significant vascular findings. Normal heart size. No pericardial effusion. Mediastinum/Nodes: No axillary or supraclavicular adenopathy. Bilateral hilar nodal enlargement with 1.5 cm hilar nodes. Esophagus normal. Lungs/Pleura: Innumerable bilateral pulmonary nodules of varying size ranging from 5 mm to 20 mm. Approximately 15 nodules per lung. There is a pleural base mass in the LEFT upper lobe laterally measuring 53 x 24 mm. There is osseous invasion of adjacent LEFT third fourth ribs anteriorly (image 44/8 adjacent to this pleural base mass per Musculoskeletal: Chest wall invasion in the anterior LEFT chest involving the anterior aspect of the third and fourth  ribs with mottled appearance of the ribs and pathologic fracture medially in the third rib. This is just superior to the pleural-based LEFT upper lobe mass. Review of the MIP images confirms the above  findings. CT ABDOMEN and PELVIS FINDINGS Hepatobiliary: Dilute density well-circumscribed ovoid lesion in the superior aspect of the LEFT hepatic lobe (segment 4A) measures 3.2 x 2.4 cm. Tiny 2 mm lesion inferior RIGHT hepatic lobe (image 30/4 normal gallbladder. Pancreas: Pancreas is normal. No ductal dilatation. No pancreatic inflammation. Spleen: Normal spleen Adrenals/urinary tract: There is nodule enlargement of the LEFT adrenal gland to 19 x 25 mm mild thickening of the RIGHT adrenal gland without discrete nodularity kidneys normal. Ureters and bladder normal Stomach/Bowel: Stomach, small-bowel cecum normal. Ascending, transverse, and descending colon normal. Rectosigmoid colon normal. No obstruction or mass. Vascular/Lymphatic: Abdominal aorta is normal caliber with atherosclerotic calcification. There is no retroperitoneal or periportal lymphadenopathy. No pelvic lymphadenopathy. Reproductive: Prostate normal Other: No peritoneal metastasis or omental thickening Musculoskeletal: No aggressive osseous lesion. Review of the MIP images confirms the above findings. IMPRESSION: Chest Impression: 1. No acute pulmonary embolism. 2. Bilateral pulmonary metastasis with innumerable nodules in both lungs. 3. Pleural base mass in the LEFT upper lobe could represent a metastatic deposit or potential primary lesion. 4. LEFT chest wall invasion adjacent to pleural base mass involving the third and fourth ribs anteriorly. Abdomen / Pelvis Impression: 1. Solitary low-density lesion in the LEFT hepatic lobe is concerning for hepatic metastasis. 2. LEFT adrenal gland metastasis. 3. No evidence of colorectal primary. This pattern of metastatic disease is suggestive of melanoma or small cell lung carcinoma. Recommend tissue sampling Electronically Signed   By: Suzy Bouchard M.D.   On: 05/18/2018 13:50   Ct Abdomen Pelvis W Contrast  Result Date: 05/18/2018 CLINICAL DATA:  Significant weight loss. Smoking history. Abnormal  chest radiograph EXAM: CT ANGIOGRAPHY CHEST CT ABDOMEN AND PELVIS WITH CONTRAST TECHNIQUE: Multidetector CT imaging of the chest was performed using the standard protocol during bolus administration of intravenous contrast. Multiplanar CT image reconstructions and MIPs were obtained to evaluate the vascular anatomy. Multidetector CT imaging of the abdomen and pelvis was performed using the standard protocol during bolus administration of intravenous contrast. CONTRAST:  42mL ISOVUE-370 IOPAMIDOL (ISOVUE-370) INJECTION 76% COMPARISON:  Chest radiograph 05/18/2018 FINDINGS: CTA CHEST FINDINGS Cardiovascular: No filling defects within the pulmonary arteries to suggest acute pulmonary embolism. No significant vascular findings. Normal heart size. No pericardial effusion. Mediastinum/Nodes: No axillary or supraclavicular adenopathy. Bilateral hilar nodal enlargement with 1.5 cm hilar nodes. Esophagus normal. Lungs/Pleura: Innumerable bilateral pulmonary nodules of varying size ranging from 5 mm to 20 mm. Approximately 15 nodules per lung. There is a pleural base mass in the LEFT upper lobe laterally measuring 53 x 24 mm. There is osseous invasion of adjacent LEFT third fourth ribs anteriorly (image 44/8 adjacent to this pleural base mass per Musculoskeletal: Chest wall invasion in the anterior LEFT chest involving the anterior aspect of the third and fourth ribs with mottled appearance of the ribs and pathologic fracture medially in the third rib. This is just superior to the pleural-based LEFT upper lobe mass. Review of the MIP images confirms the above findings. CT ABDOMEN and PELVIS FINDINGS Hepatobiliary: Dilute density well-circumscribed ovoid lesion in the superior aspect of the LEFT hepatic lobe (segment 4A) measures 3.2 x 2.4 cm. Tiny 2 mm lesion inferior RIGHT hepatic lobe (image 30/4 normal gallbladder. Pancreas: Pancreas is normal. No ductal dilatation. No pancreatic inflammation. Spleen: Normal spleen  Adrenals/urinary  tract: There is nodule enlargement of the LEFT adrenal gland to 19 x 25 mm mild thickening of the RIGHT adrenal gland without discrete nodularity kidneys normal. Ureters and bladder normal Stomach/Bowel: Stomach, small-bowel cecum normal. Ascending, transverse, and descending colon normal. Rectosigmoid colon normal. No obstruction or mass. Vascular/Lymphatic: Abdominal aorta is normal caliber with atherosclerotic calcification. There is no retroperitoneal or periportal lymphadenopathy. No pelvic lymphadenopathy. Reproductive: Prostate normal Other: No peritoneal metastasis or omental thickening Musculoskeletal: No aggressive osseous lesion. Review of the MIP images confirms the above findings. IMPRESSION: Chest Impression: 1. No acute pulmonary embolism. 2. Bilateral pulmonary metastasis with innumerable nodules in both lungs. 3. Pleural base mass in the LEFT upper lobe could represent a metastatic deposit or potential primary lesion. 4. LEFT chest wall invasion adjacent to pleural base mass involving the third and fourth ribs anteriorly. Abdomen / Pelvis Impression: 1. Solitary low-density lesion in the LEFT hepatic lobe is concerning for hepatic metastasis. 2. LEFT adrenal gland metastasis. 3. No evidence of colorectal primary. This pattern of metastatic disease is suggestive of melanoma or small cell lung carcinoma. Recommend tissue sampling Electronically Signed   By: Suzy Bouchard M.D.   On: 05/18/2018 13:50   Dg Chest Portable 1 View  Result Date: 05/18/2018 CLINICAL DATA:  Chest pain. EXAM: PORTABLE CHEST 1 VIEW COMPARISON:  02/13/2012. FINDINGS: Mediastinum and hilar structures are normal. Cardiomegaly with normal pulmonary vascularity. Multiple bilateral pulmonary nodules are noted. These are worrisome for metastatic lesions. Large pleural-based densities are noted along the left upper pleural wall. These could also be metastatic. These findings can also be caused by infectious  etiologies. Chest CT suggested for further evaluation. No pneumothorax. No acute bony abnormality. IMPRESSION: 1.  Multiple pulmonary nodules worrisome for metastatic disease. 2. Large pleural-based densities noted along the left upper pleural border. These could also be secondary to metastatic disease. IV contrast-enhanced chest CT suggested for further evaluation. Electronically Signed   By: Marcello Moores  Register   On: 05/18/2018 13:18    ASSESSMENT AND PLAN: This is a very pleasant 77 years old African-American male with widely metastatic disease involving bilateral pulmonary nodules and masses as well as hepatic and adrenal gland metastasis.  This is likely primary lung cancer but other neoplastic primary cannot be excluded at this point. I personally and independently reviewed the scan images and discussed the result and showed the images to the patient and his family. I recommended for the patient to have CT-guided core biopsy of the pleural-based left upper lobe lung mass for tissue diagnosis. I also recommend for the patient to complete the staging work-up by ordering a PET scan as well as MRI of the brain to rule out any other metastatic disease. I will see the patient back for follow-up visit immediately after his biopsy and imaging studies for more detailed discussion of his treatment options. For the right shoulder pain, this is most likely arthritic in origin but metastasis cannot be excluded and I would wait for the PET scan for further evaluation.  He was advised to use ibuprofen as needed basis. The patient was advised to call immediately if he has any concerning symptoms in the interval The patient voices understanding of current disease status and treatment options and is in agreement with the current care plan.  All questions were answered. The patient knows to call the clinic with any problems, questions or concerns. We can certainly see the patient much sooner if necessary.  I spent 15  minutes counseling the patient  face to face. The total time spent in the appointment was 25 minutes.  Disclaimer: This note was dictated with voice recognition software. Similar sounding words can inadvertently be transcribed and may not be corrected upon review.

## 2018-05-29 ENCOUNTER — Encounter: Payer: Self-pay | Admitting: *Deleted

## 2018-05-29 ENCOUNTER — Ambulatory Visit (HOSPITAL_COMMUNITY)
Admission: RE | Admit: 2018-05-29 | Discharge: 2018-05-29 | Disposition: A | Discharge status: Home / Self Care | Payer: 59 | Source: Ambulatory Visit | Attending: Internal Medicine | Admitting: Internal Medicine

## 2018-05-29 ENCOUNTER — Other Ambulatory Visit: Payer: Self-pay | Admitting: Internal Medicine

## 2018-05-29 ENCOUNTER — Encounter (HOSPITAL_COMMUNITY): Payer: Self-pay

## 2018-05-29 DIAGNOSIS — C3490 Malignant neoplasm of unspecified part of unspecified bronchus or lung: Secondary | ICD-10-CM | POA: Insufficient documentation

## 2018-05-29 DIAGNOSIS — R590 Localized enlarged lymph nodes: Secondary | ICD-10-CM | POA: Insufficient documentation

## 2018-05-29 DIAGNOSIS — C7971 Secondary malignant neoplasm of right adrenal gland: Secondary | ICD-10-CM | POA: Insufficient documentation

## 2018-05-29 DIAGNOSIS — C7951 Secondary malignant neoplasm of bone: Secondary | ICD-10-CM | POA: Diagnosis not present

## 2018-05-29 DIAGNOSIS — C7972 Secondary malignant neoplasm of left adrenal gland: Secondary | ICD-10-CM | POA: Insufficient documentation

## 2018-05-29 DIAGNOSIS — C7800 Secondary malignant neoplasm of unspecified lung: Secondary | ICD-10-CM | POA: Insufficient documentation

## 2018-05-29 DIAGNOSIS — Z79899 Other long term (current) drug therapy: Secondary | ICD-10-CM | POA: Insufficient documentation

## 2018-05-29 DIAGNOSIS — R932 Abnormal findings on diagnostic imaging of liver and biliary tract: Secondary | ICD-10-CM | POA: Insufficient documentation

## 2018-05-29 DIAGNOSIS — Z7982 Long term (current) use of aspirin: Secondary | ICD-10-CM | POA: Insufficient documentation

## 2018-05-29 LAB — GLUCOSE, CAPILLARY: GLUCOSE-CAPILLARY: 106 mg/dL — AB (ref 70–99)

## 2018-05-29 MED ORDER — FLUDEOXYGLUCOSE F - 18 (FDG) INJECTION
7.9700 | Freq: Once | INTRAVENOUS | Status: AC | PRN
  Administered 2018-05-29: 7.97 via INTRAVENOUS

## 2018-05-29 NOTE — Progress Notes (Signed)
Oncology Nurse Navigator Documentation  Oncology Nurse Navigator Flowsheets 05/29/2018  Navigator Location [facility]  Navigator Encounter Type Other/I followed up on Mr. Loudermilk's schedule. He is not schedule for his ct biopsy.  I reached out to radiology manager to help expedite.    Treatment Phase Abnormal Scans  Barriers/Navigation Needs Coordination of Care  Interventions Coordination of Care  Coordination of Care Other  Acuity Level 1  Time Spent with Patient 15

## 2018-05-29 NOTE — Progress Notes (Signed)
FMLA for spouse, Sye Schroepfer, successfully faxed to [employer] at [phone]. Patient requested to pick up copy from Ms. Wilma at front desk.

## 2018-05-30 ENCOUNTER — Encounter: Payer: Self-pay | Admitting: *Deleted

## 2018-05-30 ENCOUNTER — Telehealth: Payer: Self-pay | Admitting: *Deleted

## 2018-05-30 ENCOUNTER — Ambulatory Visit: Payer: 59 | Admitting: Internal Medicine

## 2018-05-30 ENCOUNTER — Other Ambulatory Visit: Payer: Self-pay | Admitting: *Deleted

## 2018-05-30 DIAGNOSIS — C3490 Malignant neoplasm of unspecified part of unspecified bronchus or lung: Secondary | ICD-10-CM

## 2018-05-30 NOTE — Progress Notes (Signed)
Oncology Nurse Navigator Documentation  Oncology Nurse Navigator Flowsheets 05/30/2018  Navigator Location [facility]  Navigator Encounter Type Other/I called radiology scheduling regarding Zachary Hobbs's ct biopsy.  I was updated that it is in review until PET completed. I updated this has been completed and would like to schedule. I was transferred to [facility] schedule.  I was unable to reach but did leave vm message with an update on the above information.   Treatment Phase Abnormal Scans  Barriers/Navigation Needs Coordination of Care  Interventions Coordination of Care  Coordination of Care Other  Acuity Level 1  Time Spent with Patient 30

## 2018-05-30 NOTE — Progress Notes (Unsigned)
New order entered MRI Brain

## 2018-05-30 NOTE — Telephone Encounter (Signed)
Pt wife called request refill on pain medication.WIll review with MD

## 2018-05-30 NOTE — Telephone Encounter (Signed)
Call from [facility] central scheduling advised pt was unable to finish MRI Brain scan 7/29 due to pain. Scan stopped mid procedure. New Order requested to complete pts scan. Called pt mobile and hone number to discuss above information.  Unable to reach. lmovm for pt to call office. Will review with MD

## 2018-05-31 ENCOUNTER — Other Ambulatory Visit: Payer: Self-pay | Admitting: *Deleted

## 2018-05-31 MED ORDER — OXYCODONE-ACETAMINOPHEN 5-325 MG PO TABS: 1.0000 | ORAL_TABLET | Freq: Four times a day (QID) | ORAL | 0 refills | Status: DC | PRN

## 2018-05-31 NOTE — Telephone Encounter (Signed)
Pain meds refilled. [facility]OV[facility] rx ready for pick up

## 2018-06-01 ENCOUNTER — Other Ambulatory Visit: Payer: Self-pay | Admitting: Radiology

## 2018-06-01 ENCOUNTER — Other Ambulatory Visit: Payer: Self-pay | Admitting: Student

## 2018-06-02 ENCOUNTER — Ambulatory Visit (HOSPITAL_COMMUNITY)
Admission: RE | Admit: 2018-06-02 | Discharge: 2018-06-02 | Disposition: A | Discharge status: Home / Self Care | Payer: 59 | Source: Ambulatory Visit | Attending: Interventional Radiology | Admitting: Interventional Radiology

## 2018-06-02 ENCOUNTER — Encounter (HOSPITAL_COMMUNITY): Payer: Self-pay

## 2018-06-02 ENCOUNTER — Ambulatory Visit (HOSPITAL_COMMUNITY)
Admission: RE | Admit: 2018-06-02 | Discharge: 2018-06-02 | Disposition: A | Discharge status: Home / Self Care | Payer: 59 | Source: Ambulatory Visit | Attending: Internal Medicine | Admitting: Internal Medicine

## 2018-06-02 DIAGNOSIS — C7971 Secondary malignant neoplasm of right adrenal gland: Secondary | ICD-10-CM | POA: Insufficient documentation

## 2018-06-02 DIAGNOSIS — C7972 Secondary malignant neoplasm of left adrenal gland: Secondary | ICD-10-CM | POA: Insufficient documentation

## 2018-06-02 DIAGNOSIS — C801 Malignant (primary) neoplasm, unspecified: Secondary | ICD-10-CM | POA: Diagnosis not present

## 2018-06-02 DIAGNOSIS — R590 Localized enlarged lymph nodes: Secondary | ICD-10-CM | POA: Diagnosis not present

## 2018-06-02 DIAGNOSIS — Z7982 Long term (current) use of aspirin: Secondary | ICD-10-CM | POA: Diagnosis not present

## 2018-06-02 DIAGNOSIS — C787 Secondary malignant neoplasm of liver and intrahepatic bile duct: Secondary | ICD-10-CM | POA: Insufficient documentation

## 2018-06-02 DIAGNOSIS — R918 Other nonspecific abnormal finding of lung field: Secondary | ICD-10-CM | POA: Diagnosis present

## 2018-06-02 DIAGNOSIS — R05 Cough: Secondary | ICD-10-CM | POA: Diagnosis present

## 2018-06-02 DIAGNOSIS — C7802 Secondary malignant neoplasm of left lung: Secondary | ICD-10-CM | POA: Insufficient documentation

## 2018-06-02 DIAGNOSIS — R0789 Other chest pain: Secondary | ICD-10-CM | POA: Diagnosis not present

## 2018-06-02 DIAGNOSIS — Z79899 Other long term (current) drug therapy: Secondary | ICD-10-CM | POA: Insufficient documentation

## 2018-06-02 DIAGNOSIS — R911 Solitary pulmonary nodule: Secondary | ICD-10-CM | POA: Diagnosis not present

## 2018-06-02 DIAGNOSIS — Z9889 Other specified postprocedural states: Secondary | ICD-10-CM

## 2018-06-02 DIAGNOSIS — I10 Essential (primary) hypertension: Secondary | ICD-10-CM | POA: Diagnosis not present

## 2018-06-02 DIAGNOSIS — C3490 Malignant neoplasm of unspecified part of unspecified bronchus or lung: Secondary | ICD-10-CM

## 2018-06-02 DIAGNOSIS — C3492 Malignant neoplasm of unspecified part of left bronchus or lung: Secondary | ICD-10-CM | POA: Diagnosis not present

## 2018-06-02 LAB — CBC
HEMATOCRIT: 32.8 % — AB (ref 39.0–52.0)
Hemoglobin: 10.1 g/dL — ABNORMAL LOW (ref 13.0–17.0)
MCH: 23.3 pg — ABNORMAL LOW (ref 26.0–34.0)
MCHC: 30.8 g/dL (ref 30.0–36.0)
MCV: 75.8 fL — AB (ref 78.0–100.0)
PLATELETS: 433 10*3/uL — AB (ref 150–400)
RBC: 4.33 MIL/uL (ref 4.22–5.81)
RDW: 14.6 % (ref 11.5–15.5)
WBC: 11 10*3/uL — AB (ref 4.0–10.5)

## 2018-06-02 LAB — PROTIME-INR
INR: 1.26
Prothrombin Time: 15.7 seconds — ABNORMAL HIGH (ref 11.4–15.2)

## 2018-06-02 LAB — APTT: aPTT: 31 seconds (ref 24–36)

## 2018-06-02 MED ORDER — FENTANYL CITRATE (PF) 100 MCG/2ML IJ SOLN
INTRAMUSCULAR | Status: AC
  Filled 2018-06-02: qty 4

## 2018-06-02 MED ORDER — MIDAZOLAM HCL 2 MG/2ML IJ SOLN
INTRAMUSCULAR | Status: AC
  Filled 2018-06-02: qty 4

## 2018-06-02 MED ORDER — LIDOCAINE HCL 1 % IJ SOLN
INTRAMUSCULAR | Status: AC
  Filled 2018-06-02: qty 20

## 2018-06-02 MED ORDER — HYDROCODONE-ACETAMINOPHEN 5-325 MG PO TABS
1.0000 | ORAL_TABLET | ORAL | Status: DC | PRN
  Filled 2018-06-02: qty 2

## 2018-06-02 MED ORDER — FENTANYL CITRATE (PF) 100 MCG/2ML IJ SOLN
INTRAMUSCULAR | Status: AC | PRN
  Administered 2018-06-02 (×2): 50 ug via INTRAVENOUS

## 2018-06-02 MED ORDER — KETOROLAC TROMETHAMINE 30 MG/ML IJ SOLN: 30.0000 mg | Freq: Once | INTRAMUSCULAR | Status: DC

## 2018-06-02 MED ORDER — MIDAZOLAM HCL 2 MG/2ML IJ SOLN
INTRAMUSCULAR | Status: AC | PRN
  Administered 2018-06-02 (×2): 1 mg via INTRAVENOUS

## 2018-06-02 MED ORDER — SODIUM CHLORIDE 0.9 % IV SOLN: INTRAVENOUS | Status: DC

## 2018-06-02 MED ORDER — SODIUM CHLORIDE 0.9 % IV SOLN
INTRAVENOUS | Status: AC | PRN
  Administered 2018-06-02: 10 mL/h via INTRAVENOUS

## 2018-06-02 NOTE — Procedures (Signed)
Interventional Radiology Procedure Note  Procedure: CT guided biopsy of left pleural based mass Complications: No immediate Recommendations: - Bedrest until CXR cleared.  Minimize talking, coughing or otherwise straining.  - Follow up 1 hr CXR pending   Signed,  Criselda Peaches, MD

## 2018-06-02 NOTE — H&P (Signed)
Chief Complaint: Patient was seen in consultation today for left lung mass biopsy at the request of [facility]  Referring Physician(s): Mohamed,Mohamed  Supervising Physician: Jacqulynn Cadet  Patient Status: [facility] - Out-pt  History of Present Illness: Zachary Hobbs is a 77 y.o. male   Pt with Right shoulder pain for weeks Wt loss; fatigue Cough with hemoptysis intermittently Evaluation for same CT 05/18/18:  IMPRESSION: Chest Impression: 1. No acute pulmonary embolism. 2. Bilateral pulmonary metastasis with innumerable nodules in both lungs. 3. Pleural base mass in the LEFT upper lobe could represent a metastatic deposit or potential primary lesion. 4. LEFT chest wall invasion adjacent to pleural base mass involving the third and fourth ribs anteriorly.  Abdomen / Pelvis Impression: 1. Solitary low-density lesion in the LEFT hepatic lobe is concerning for hepatic metastasis. 2. LEFT adrenal gland metastasis. 3. No evidence of colorectal primary. This pattern of metastatic disease is suggestive of melanoma or small cell lung carcinoma. Recommend tissue sampling  PET 7/29: IMPRESSION: 1. Large hypermetabolic peripheral left lung mass invading the chest wall and adjacent third and fourth anterior ribs consistent with known neoplasm. 2. Diffuse pulmonary metastatic disease. 3. Bilateral hilar adenopathy. 4. Diffuse osseous metastatic disease. 5. Single metastatic focus in the left hepatic lobe and bilateral adrenal gland metastasis.  Scheduled now for LUL mass bx  Past Medical History:  Diagnosis Date  . Hypertension     Past Surgical History:  Procedure Laterality Date  . COLONOSCOPY      Allergies: Rosuvastatin  Medications: Prior to Admission medications   Medication Sig Start Date End Date Taking? Authorizing Provider  aspirin EC 81 MG tablet Take 81 mg by mouth daily.   Yes [provider]  meclizine (ANTIVERT) 50 MG tablet  Take 1 tablet (50 mg total) by mouth 3 (three) times daily as needed. Patient taking differently: Take 50 mg by mouth 3 (three) times daily as needed for dizziness.  12/19/15  Yes Mackuen, Courteney Lyn, MD  omeprazole (PRILOSEC OTC) 20 MG tablet Take 20 mg by mouth daily.   Yes [provider]  oxyCODONE-acetaminophen (PERCOCET/ROXICET) 5-325 MG tablet Take 1 tablet by mouth every 6 (six) hours as needed for severe pain. 05/31/18  Yes Curcio, Roselie Awkward, NP  simvastatin (ZOCOR) 40 MG tablet Take 40 mg by mouth every evening.   Yes [provider]  traMADol (ULTRAM) 50 MG tablet Take 25 mg by mouth 3 (three) times daily as needed. 05/15/18  Yes [provider]  valsartan-hydrochlorothiazide (DIOVAN-HCT) 320-25 MG tablet Take 1 tablet by mouth daily. 12/05/15  Yes [provider]     History reviewed. No pertinent family history.  Social History   Socioeconomic History  . Marital status: Married    Spouse name: Not on file  . Number of children: Not on file  . Years of education: Not on file  . Highest education level: Not on file  Occupational History  . Not on file  Social Needs  . Financial resource strain: Not on file  . Food insecurity:    Worry: Not on file    Inability: Not on file  . Transportation needs:    Medical: Not on file    Non-medical: Not on file  Tobacco Use  . Smoking status: Never Smoker  Substance and Sexual Activity  . Alcohol use: No  . Drug use: No  . Sexual activity: Not on file  Lifestyle  . Physical activity:    Days per week: Not  on file    Minutes per session: Not on file  . Stress: Not on file  Relationships  . Social connections:    Talks on phone: Not on file    Gets together: Not on file    Attends religious service: Not on file    Active member of club or organization: Not on file    Attends meetings of clubs or organizations: Not on file    Relationship status: Not on file  Other Topics Concern  . Not on  file  Social History Narrative  . Not on file    Review of Systems: A 12 point ROS discussed and pertinent positives are indicated in the HPI above.  All other systems are negative.  Review of Systems  Constitutional: Positive for activity change, appetite change, fatigue and unexpected weight change.  Respiratory: Positive for cough. Negative for shortness of breath.   Cardiovascular: Negative for chest pain.  Gastrointestinal: Negative for abdominal pain.  Musculoskeletal: Positive for back pain.       Right shoulder pain  Neurological: Positive for weakness.  Psychiatric/Behavioral: Negative for behavioral problems and confusion.    Vital Signs: BP 118/63   Pulse (!) 105   Temp 98.2 F (36.8 C)   Resp 20   Ht 5\' 8"  (1.727 m)   Wt 149 lb (67.6 kg)   SpO2 97%   BMI 22.66 kg/m   Physical Exam  Constitutional: He is oriented to person, place, and time.  Cardiovascular: Normal rate, regular rhythm and normal heart sounds.  Pulmonary/Chest: Effort normal and breath sounds normal.  Abdominal: Soft. Bowel sounds are normal.  Musculoskeletal: Normal range of motion.  Right shoulder pain-- decreased mobility  Neurological: He is alert and oriented to person, place, and time.  Skin: Skin is warm and dry.  Psychiatric: He has a normal mood and affect. His behavior is normal. Judgment and thought content normal.  Nursing note and vitals reviewed.   Imaging: Ct Soft Tissue Neck W Contrast  Result Date: 05/18/2018 CLINICAL DATA:  Weight loss. EXAM: CT NECK WITH CONTRAST TECHNIQUE: Multidetector CT imaging of the neck was performed using the standard protocol following the bolus administration of intravenous contrast. CONTRAST:  41mL ISOVUE-370 IOPAMIDOL (ISOVUE-370) INJECTION 76% COMPARISON:  CT chest, abdomen, pelvis reported separately. FINDINGS: Pharynx and larynx: Normal. No mass or swelling. Salivary glands: No inflammation, mass, or stone. Thyroid: Normal. Lymph nodes: None  enlarged or abnormal density. Vascular: Atherosclerosis. Limited intracranial: Not assessed. Visualized orbits: Partially visualized, negative. Mastoids and visualized paranasal sinuses: Clear. Skeleton: Cervical spondylosis. Periapical lucencies suggesting dental disease. Upper chest: Reported separately.  Multiple masses are identified. Other: None. IMPRESSION: No visible head/neck primary or malignant appearing cervical adenopathy. Carotid bifurcation atherosclerosis. Cervical spondylosis, and dental disease. Electronically Signed   By: Staci Righter M.D.   On: 05/18/2018 13:28   Ct Angio Chest Pe W And/or Wo Contrast  Result Date: 05/18/2018 CLINICAL DATA:  Significant weight loss. Smoking history. Abnormal chest radiograph EXAM: CT ANGIOGRAPHY CHEST CT ABDOMEN AND PELVIS WITH CONTRAST TECHNIQUE: Multidetector CT imaging of the chest was performed using the standard protocol during bolus administration of intravenous contrast. Multiplanar CT image reconstructions and MIPs were obtained to evaluate the vascular anatomy. Multidetector CT imaging of the abdomen and pelvis was performed using the standard protocol during bolus administration of intravenous contrast. CONTRAST:  84mL ISOVUE-370 IOPAMIDOL (ISOVUE-370) INJECTION 76% COMPARISON:  Chest radiograph 05/18/2018 FINDINGS: CTA CHEST FINDINGS Cardiovascular: No filling defects within the pulmonary arteries to  suggest acute pulmonary embolism. No significant vascular findings. Normal heart size. No pericardial effusion. Mediastinum/Nodes: No axillary or supraclavicular adenopathy. Bilateral hilar nodal enlargement with 1.5 cm hilar nodes. Esophagus normal. Lungs/Pleura: Innumerable bilateral pulmonary nodules of varying size ranging from 5 mm to 20 mm. Approximately 15 nodules per lung. There is a pleural base mass in the LEFT upper lobe laterally measuring 53 x 24 mm. There is osseous invasion of adjacent LEFT third fourth ribs anteriorly (image 44/8  adjacent to this pleural base mass per Musculoskeletal: Chest wall invasion in the anterior LEFT chest involving the anterior aspect of the third and fourth ribs with mottled appearance of the ribs and pathologic fracture medially in the third rib. This is just superior to the pleural-based LEFT upper lobe mass. Review of the MIP images confirms the above findings. CT ABDOMEN and PELVIS FINDINGS Hepatobiliary: Dilute density well-circumscribed ovoid lesion in the superior aspect of the LEFT hepatic lobe (segment 4A) measures 3.2 x 2.4 cm. Tiny 2 mm lesion inferior RIGHT hepatic lobe (image 30/4 normal gallbladder. Pancreas: Pancreas is normal. No ductal dilatation. No pancreatic inflammation. Spleen: Normal spleen Adrenals/urinary tract: There is nodule enlargement of the LEFT adrenal gland to 19 x 25 mm mild thickening of the RIGHT adrenal gland without discrete nodularity kidneys normal. Ureters and bladder normal Stomach/Bowel: Stomach, small-bowel cecum normal. Ascending, transverse, and descending colon normal. Rectosigmoid colon normal. No obstruction or mass. Vascular/Lymphatic: Abdominal aorta is normal caliber with atherosclerotic calcification. There is no retroperitoneal or periportal lymphadenopathy. No pelvic lymphadenopathy. Reproductive: Prostate normal Other: No peritoneal metastasis or omental thickening Musculoskeletal: No aggressive osseous lesion. Review of the MIP images confirms the above findings. IMPRESSION: Chest Impression: 1. No acute pulmonary embolism. 2. Bilateral pulmonary metastasis with innumerable nodules in both lungs. 3. Pleural base mass in the LEFT upper lobe could represent a metastatic deposit or potential primary lesion. 4. LEFT chest wall invasion adjacent to pleural base mass involving the third and fourth ribs anteriorly. Abdomen / Pelvis Impression: 1. Solitary low-density lesion in the LEFT hepatic lobe is concerning for hepatic metastasis. 2. LEFT adrenal gland  metastasis. 3. No evidence of colorectal primary. This pattern of metastatic disease is suggestive of melanoma or small cell lung carcinoma. Recommend tissue sampling Electronically Signed   By: Suzy Bouchard M.D.   On: 05/18/2018 13:50   Mr Brain Wo Contrast  Result Date: 05/30/2018 CLINICAL DATA:  Lung cancer staging. EXAM: MRI HEAD WITHOUT CONTRAST TECHNIQUE: Sagittal T1 sequence. COMPARISON:  CT HEAD January 26, 2011 FINDINGS: Technologist report patient was in too much pain to continue examination and study was terminated. Brain: Limited assessment. No significant mass effect. No hydrocephalus. No cerebellar tonsillar ectopia. No abnormal intrinsic T1 shortening. Vascular: Nondiagnostic. Skull and upper cervical spine: No suspicious calvarial bone marrow signal. Sinuses/Orbits: Nondiagnostic. Other: None. IMPRESSION: Limited sagittal T1 sequence. Nondiagnostic assessment for metastasis. Consider repeat examination with appropriate medication. Electronically Signed   By: Elon Alas M.D.   On: 05/30/2018 02:54   Ct Abdomen Pelvis W Contrast  Result Date: 05/18/2018 CLINICAL DATA:  Significant weight loss. Smoking history. Abnormal chest radiograph EXAM: CT ANGIOGRAPHY CHEST CT ABDOMEN AND PELVIS WITH CONTRAST TECHNIQUE: Multidetector CT imaging of the chest was performed using the standard protocol during bolus administration of intravenous contrast. Multiplanar CT image reconstructions and MIPs were obtained to evaluate the vascular anatomy. Multidetector CT imaging of the abdomen and pelvis was performed using the standard protocol during bolus administration of intravenous contrast. CONTRAST:  27mL  ISOVUE-370 IOPAMIDOL (ISOVUE-370) INJECTION 76% COMPARISON:  Chest radiograph 05/18/2018 FINDINGS: CTA CHEST FINDINGS Cardiovascular: No filling defects within the pulmonary arteries to suggest acute pulmonary embolism. No significant vascular findings. Normal heart size. No pericardial effusion.  Mediastinum/Nodes: No axillary or supraclavicular adenopathy. Bilateral hilar nodal enlargement with 1.5 cm hilar nodes. Esophagus normal. Lungs/Pleura: Innumerable bilateral pulmonary nodules of varying size ranging from 5 mm to 20 mm. Approximately 15 nodules per lung. There is a pleural base mass in the LEFT upper lobe laterally measuring 53 x 24 mm. There is osseous invasion of adjacent LEFT third fourth ribs anteriorly (image 44/8 adjacent to this pleural base mass per Musculoskeletal: Chest wall invasion in the anterior LEFT chest involving the anterior aspect of the third and fourth ribs with mottled appearance of the ribs and pathologic fracture medially in the third rib. This is just superior to the pleural-based LEFT upper lobe mass. Review of the MIP images confirms the above findings. CT ABDOMEN and PELVIS FINDINGS Hepatobiliary: Dilute density well-circumscribed ovoid lesion in the superior aspect of the LEFT hepatic lobe (segment 4A) measures 3.2 x 2.4 cm. Tiny 2 mm lesion inferior RIGHT hepatic lobe (image 30/4 normal gallbladder. Pancreas: Pancreas is normal. No ductal dilatation. No pancreatic inflammation. Spleen: Normal spleen Adrenals/urinary tract: There is nodule enlargement of the LEFT adrenal gland to 19 x 25 mm mild thickening of the RIGHT adrenal gland without discrete nodularity kidneys normal. Ureters and bladder normal Stomach/Bowel: Stomach, small-bowel cecum normal. Ascending, transverse, and descending colon normal. Rectosigmoid colon normal. No obstruction or mass. Vascular/Lymphatic: Abdominal aorta is normal caliber with atherosclerotic calcification. There is no retroperitoneal or periportal lymphadenopathy. No pelvic lymphadenopathy. Reproductive: Prostate normal Other: No peritoneal metastasis or omental thickening Musculoskeletal: No aggressive osseous lesion. Review of the MIP images confirms the above findings. IMPRESSION: Chest Impression: 1. No acute pulmonary embolism. 2.  Bilateral pulmonary metastasis with innumerable nodules in both lungs. 3. Pleural base mass in the LEFT upper lobe could represent a metastatic deposit or potential primary lesion. 4. LEFT chest wall invasion adjacent to pleural base mass involving the third and fourth ribs anteriorly. Abdomen / Pelvis Impression: 1. Solitary low-density lesion in the LEFT hepatic lobe is concerning for hepatic metastasis. 2. LEFT adrenal gland metastasis. 3. No evidence of colorectal primary. This pattern of metastatic disease is suggestive of melanoma or small cell lung carcinoma. Recommend tissue sampling Electronically Signed   By: Suzy Bouchard M.D.   On: 05/18/2018 13:50   Nm Pet Image Initial (pi) Skull Base To Thigh  Result Date: 05/29/2018 CLINICAL DATA:  Initial treatment strategy for non-small cell lung cancer. EXAM: NUCLEAR MEDICINE PET SKULL BASE TO THIGH TECHNIQUE: 7.97 mCi F-18 FDG was injected intravenously. Full-ring PET imaging was performed from the skull base to thigh after the radiotracer. CT data was obtained and used for attenuation correction and anatomic localization. Fasting blood glucose: 106 mg/dl COMPARISON:  CT scans 05/18/2018 FINDINGS: Mediastinal blood pool activity: SUV max 2.60 NECK: No hypermetabolic lymph nodes in the neck. Incidental CT findings: none CHEST: The 5 x 3 cm peripheral left upper lobe lung mass versus pleural mass is markedly hypermetabolic with SUV max of 24.40. Findings consistent with associated chest wall invasion involving and partially destroying the third and fourth left anterior ribs with pathologic fractures. Diffuse pulmonary metastatic disease with innumerable hypermetabolic pulmonary lesions throughout both lungs. 2.6 cm index lesion at the left lung base has an SUV max of 21.37. Index lesion in the right lower lobe  on image number 42 measures 18 mm and SUV max is 16.46. Right hilar adenopathy is hypermetabolic with SUV max of 01.75. Left hilar adenopathy is  hypermetabolic with SUV max of 10.25. 12.5 mm left subpectoral lymph node on image 56 has an SUV max of 16.08. Incidental CT findings: none ABDOMEN/PELVIS: 3 cm segment 2 liver lesion is hypermetabolic with SUV max of 85.27. Bilateral adrenal gland metastasis. The 2.2 cm left adrenal gland lesion has an SUV max of 23.29 and the 15 mm right adrenal gland lesion has an SUV max of 12.62. No metastatic lymphadenopathy. Incidental CT findings: none SKELETON: Diffuse osseous metastatic disease involving the axial and appendicular skeleton. No spinal canal compromise is identified. No pathologic fractures other than the left third and fourth anterior ribs but those are involved with direct invasion of tumor. Incidental CT findings: none IMPRESSION: 1. Large hypermetabolic peripheral left lung mass invading the chest wall and adjacent third and fourth anterior ribs consistent with known neoplasm. 2. Diffuse pulmonary metastatic disease. 3. Bilateral hilar adenopathy. 4. Diffuse osseous metastatic disease. 5. Single metastatic focus in the left hepatic lobe and bilateral adrenal gland metastasis. Electronically Signed   By: Marijo Sanes M.D.   On: 05/29/2018 17:06   Dg Chest Portable 1 View  Result Date: 05/18/2018 CLINICAL DATA:  Chest pain. EXAM: PORTABLE CHEST 1 VIEW COMPARISON:  02/13/2012. FINDINGS: Mediastinum and hilar structures are normal. Cardiomegaly with normal pulmonary vascularity. Multiple bilateral pulmonary nodules are noted. These are worrisome for metastatic lesions. Large pleural-based densities are noted along the left upper pleural wall. These could also be metastatic. These findings can also be caused by infectious etiologies. Chest CT suggested for further evaluation. No pneumothorax. No acute bony abnormality. IMPRESSION: 1.  Multiple pulmonary nodules worrisome for metastatic disease. 2. Large pleural-based densities noted along the left upper pleural border. These could also be secondary to  metastatic disease. IV contrast-enhanced chest CT suggested for further evaluation. Electronically Signed   By: Marcello Moores  Register   On: 05/18/2018 13:18    Labs:  CBC: Recent Labs    05/16/18 1127 05/18/18 1156  WBC 9.3 9.4  HGB 10.3* 9.6*  HCT 31.9* 30.1*  PLT 381 384    COAGS: Recent Labs    05/18/18 1156  INR 1.21    BMP: Recent Labs    05/18/18 1156  NA 141  K 4.8  CL 104  CO2 25  GLUCOSE 100*  BUN 40*  CALCIUM 9.6  CREATININE 1.94*  GFRNONAA 32*  GFRAA 37*    LIVER FUNCTION TESTS: Recent Labs    05/18/18 1156  BILITOT 0.7  AST 31  ALT 54*  ALKPHOS 113  PROT 7.9  ALBUMIN 2.9*    TUMOR MARKERS: No results for input(s): AFPTM, CEA, CA199, CHROMGRNA in the last 8760 hours.  Assessment and Plan:  B Lung nodules LUL pleural based  mass with rib erosion Hepatic and adrenal lesions +PET Now scheduled for LUL pleural based mass biopsy Risks and benefits discussed with the patient including, but not limited to bleeding, hemoptysis, respiratory failure requiring intubation, infection, pneumothorax requiring chest tube placement, stroke from air embolism or even death.  All of the patient's questions were answered, patient is agreeable to proceed. Consent signed and in chart.  Thank you for this interesting consult.  I greatly enjoyed meeting NIKLAUS MAMARIL and look forward to participating in their care.  A copy of this report was sent to the requesting provider on this date.  Electronically  Signed: Jinny Sweetland A, PA-C 06/02/2018, 10:46 AM   I spent a total of  30 Minutes   in face to face in clinical consultation, greater than 50% of which was counseling/coordinating care for left lung mass bx

## 2018-06-02 NOTE — Discharge Instructions (Signed)
Lung Biopsy, Care After °This sheet gives you information about how to care for yourself after your procedure. Your health care provider may also give you more specific instructions depending on the type of biopsy you had. If you have problems or questions, contact your health care provider. °What can I expect after the procedure? °After the procedure, it is common to have: °· A cough. °· A sore throat. °· Pain where a needle, bronchoscope, or incision was used to collect a biopsy sample (biopsy site). ° °Follow these instructions at home: °Medicines °· Take over-the-counter and prescription medicines only as told by your health care provider. °· Do not drive for 24 hours if you were given a sedative. °· Do not drink alcohol while taking pain medicine. °· Do not drive or use heavy machinery while taking prescription pain medicine. °· To prevent or treat constipation while you are taking prescription pain medicine, your health care provider may recommend that you: °? Drink enough fluid to keep your urine clear or pale yellow. °? Take over-the-counter or prescription medicines. °? Eat foods that are high in fiber, such as fresh fruits and vegetables, whole grains, and beans. °? Limit foods that are high in fat and processed sugars, such as fried and sweet foods. °Activity °· If you had an incision during your procedure, avoid activities that may pull the incision site open. °· Return to your normal activities as told by your health care provider. Ask your health care provider what activities are safe for you. °If you had an open biopsy:  °· Follow instructions from your health care provider about how to take care of your incision. Make sure you: °? Wash your hands with soap and water before you change your bandage (dressing). If soap and water are not available, use hand sanitizer. °? Change your dressing as told by your health care provider. °? Leave stitches (sutures), skin glue, or adhesive strips in place. These  skin closures may need to stay in place for 2 weeks or longer. If adhesive strip edges start to loosen and curl up, you may trim the loose edges. Do not remove adhesive strips completely unless your health care provider tells you to do that. °· Check your incision area every day for signs of infection. Check for: °? Redness, swelling, or pain. °? Fluid or blood. °? Warmth. °? Pus or a bad smell. °General instructions °· It is up to you to get the results of your procedure. Ask your health care provider, or the department that is doing the procedure, when your results will be ready. °Contact a health care provider if: °· You have a fever. °· You have redness, swelling, or pain around your biopsy site. °· You have fluid or blood coming from your biopsy site. °· Your biopsy site feels warm to the touch. °· You have pus or a bad smell coming from your biopsy site. °Get help right away if: °· You cough up blood. °· You have trouble breathing. °· You have chest pain. °Summary °· After the procedure, it is common to have a sore throat and a cough. °· Return to your normal activities as told by your health care provider. Ask your health care provider what activities are safe for you. °· Take over-the-counter and prescription medicines only as told by your health care provider. °· Report any unusual symptoms to your health care provider. °This information is not intended to replace advice given to you by your health care provider. Make sure   you discuss any questions you have with your health care provider. °Document Released: 11/16/2016 Document Revised: 11/16/2016 Document Reviewed: 11/16/2016 °Elsevier Interactive Patient Education © 2018 [employer] ° °

## 2018-06-02 NOTE — Sedation Documentation (Signed)
Patient is resting comfortably. 

## 2018-06-07 ENCOUNTER — Other Ambulatory Visit: Payer: Self-pay | Admitting: Internal Medicine

## 2018-06-07 ENCOUNTER — Encounter: Payer: Self-pay | Admitting: *Deleted

## 2018-06-07 ENCOUNTER — Telehealth: Payer: Self-pay | Admitting: *Deleted

## 2018-06-07 ENCOUNTER — Ambulatory Visit (HOSPITAL_COMMUNITY)
Admission: RE | Admit: 2018-06-07 | Discharge: 2018-06-07 | Disposition: A | Discharge status: Home / Self Care | Payer: 59 | Source: Ambulatory Visit | Attending: Internal Medicine | Admitting: Internal Medicine

## 2018-06-07 DIAGNOSIS — C3490 Malignant neoplasm of unspecified part of unspecified bronchus or lung: Secondary | ICD-10-CM | POA: Insufficient documentation

## 2018-06-07 DIAGNOSIS — C7931 Secondary malignant neoplasm of brain: Secondary | ICD-10-CM | POA: Diagnosis not present

## 2018-06-07 NOTE — Progress Notes (Signed)
Oncology Nurse Navigator Documentation  Oncology Nurse Navigator Flowsheets 06/07/2018  Navigator Location [facility]  Navigator Encounter Type Other/per Dr. Julien Nordmann, foundation one and PDL 1 requested on recent pathology  Treatment Phase Pre-Tx/Tx Discussion  Barriers/Navigation Needs Coordination of Care  Interventions Coordination of Care  Coordination of Care Other  Acuity Level 2  Time Spent with Patient 30

## 2018-06-07 NOTE — Progress Notes (Addendum)
Location/Histology of Brain Tumor: Primary Lung cancer with mets to brain  Patient presented with symptoms of: Increasing fatigue, right shoulder pain, weight loss and mild cough.  Seen in the ED for cough with blood tinged sputum.  Biopsy Left Lung 06/02/2018  CT CAP 05/18/2018: innumerable bilateral pulmonary nodules of varying size ranging from 0.5 cm to 2.0 cm.  There was approximately 15 nodules per lung.  There was also pleural-based mass in the left upper lobe laterally measuring 5.3 x 2.4 cm.  There is osseous invasion of adjacent left third, fourth ribs anteriorly adjacent to this pleural-based mass. There was also solitary low-density lesion in the left hepatic lobe concerning for hepatic metastases as well as left adrenal gland metastasis.  There was no evidence for colorectal primary.  This pattern of metastatic disease is suggestive of melanoma or small cell carcinoma.   PET 05/29/2018: The 5 x 3 cm peripheral left upper lobe lung mass versus pleural mass is markedly hypermetabolic with SUV max of 16.10.  MRI Brain 06/07/2018: 2 cm mass in the right temporal lobe consistent with metastasis.  Question the presence of a 7 mm metastasis at a right posterior frontal gyrus at the vertex. In the left hemisphere, there is a 7-8 mm metastasis in the region of the corpus callosum or subependymal brain, bulging into the body of the left lateral ventricle. There is an 8 mm metastasis at the left parietal vertex. There is some associated edema, particularly related to the right temporal metastasis. No mass effect or shift however. No sign of hemorrhage.  IMPRESSION: Abbreviated exam. At least 4 metastases identified. 2 cm right temporal lobe metastasis with some surrounding edema. Question 7 mm metastasis at the right posterior frontal vertex. 7-8 mm metastasis of the left corpus callosum or subependymal brain. 8 mm metastasis at the left parietal vertex. Past or anticipated interventions, if any, per  neurosurgery:   Past or anticipated interventions, if any, per medical oncology:  Dr. Julien Nordmann 05/24/2018 -I recommended for the patient to have CT-guided core biopsy of the pleural-based left upper lobe lung mass for tissue diagnosis. -I also recommend for the patient to complete the staging work-up by ordering a PET scan as well as MRI of the brain to rule out any other metastatic disease. -I will see the patient back for follow-up visit immediately after his biopsy and imaging studies for more detailed discussion of his treatment options. -For the right shoulder pain, this is most likely arthritic in origin but metastasis cannot be excluded and I would wait for the PET scan for further evaluation.  He was advised to use ibuprofen as needed basis.   Dose of Decadron, if applicable: None  Recent neurologic symptoms, if any:   Seizures: No  Headaches: No  Nausea: No  Dizziness/ataxia: No  Difficulty with hand coordination: No  Focal numbness/weakness: Does have some weakness in his hands that is not new.  Visual deficits/changes: No  Confusion/Memory deficits: Wife reports new onset confusion with memory loss.  Painful bone metastases at present, if any:   BP (!) 88/52 (BP Location: Left Arm, Patient Position: Sitting)   Pulse (!) 109   Temp (!) 97.5 F (36.4 C) (Oral)   Ht 5' 8.75" (1.746 m)   Wt 137 lb 6.4 oz (62.3 kg)   SpO2 98%   BMI 20.44 kg/m    Wt Readings from Last 3 Encounters:  06/08/18 137 lb 6.4 oz (62.3 kg)  06/02/18 137 lb (62.1 kg)  05/24/18 149  lb 14.4 oz (68 kg)   SAFETY ISSUES:  Prior radiation? No  Pacemaker/ICD? No  Possible current pregnancy? No  Is the patient on methotrexate? No  Additional Complaints / other details:

## 2018-06-07 NOTE — Telephone Encounter (Signed)
Oncology Nurse Navigator Documentation  Oncology Nurse Navigator Flowsheets 06/07/2018  Navigator Location [facility]  Navigator Encounter Type Telephone/I followed up on Zachary Hobbs's scan today.  Dr. Julien Nordmann is aware and would like rad onc referral and to see him this Friday.  I called and spoke with patient's wife.  I updated her on appt and that she would be getting a call from [place]. She verbalized understanding.   Telephone Outgoing Call  Confirmed Diagnosis Date 06/02/2018  Treatment Phase Pre-Tx/Tx Discussion  Barriers/Navigation Needs Education;Coordination of Care  Education Other  Interventions Coordination of Care;Education  Coordination of Care Appts  Education Method Verbal  Acuity Level 2  Time Spent with Patient 30

## 2018-06-08 ENCOUNTER — Inpatient Hospital Stay: Payer: 59

## 2018-06-08 ENCOUNTER — Encounter: Payer: Self-pay | Admitting: Radiation Oncology

## 2018-06-08 ENCOUNTER — Ambulatory Visit
Admission: RE | Admit: 2018-06-08 | Discharge: 2018-06-08 | Disposition: A | Discharge status: Home / Self Care | Payer: 59 | Source: Ambulatory Visit | Attending: Radiation Oncology | Admitting: Radiation Oncology

## 2018-06-08 ENCOUNTER — Other Ambulatory Visit: Payer: Self-pay

## 2018-06-08 VITALS — BP 88/52 | HR 109 | Temp 97.5°F | Ht 68.75 in | Wt 137.4 lb

## 2018-06-08 DIAGNOSIS — C3412 Malignant neoplasm of upper lobe, left bronchus or lung: Secondary | ICD-10-CM | POA: Insufficient documentation

## 2018-06-08 DIAGNOSIS — I639 Cerebral infarction, unspecified: Secondary | ICD-10-CM | POA: Diagnosis not present

## 2018-06-08 DIAGNOSIS — Z66 Do not resuscitate: Secondary | ICD-10-CM | POA: Diagnosis not present

## 2018-06-08 DIAGNOSIS — I10 Essential (primary) hypertension: Secondary | ICD-10-CM | POA: Diagnosis not present

## 2018-06-08 DIAGNOSIS — C7951 Secondary malignant neoplasm of bone: Secondary | ICD-10-CM

## 2018-06-08 DIAGNOSIS — Z79899 Other long term (current) drug therapy: Secondary | ICD-10-CM | POA: Insufficient documentation

## 2018-06-08 DIAGNOSIS — I951 Orthostatic hypotension: Secondary | ICD-10-CM | POA: Diagnosis not present

## 2018-06-08 DIAGNOSIS — Z86010 Personal history of colonic polyps: Secondary | ICD-10-CM

## 2018-06-08 DIAGNOSIS — C3492 Malignant neoplasm of unspecified part of left bronchus or lung: Secondary | ICD-10-CM

## 2018-06-08 DIAGNOSIS — C3490 Malignant neoplasm of unspecified part of unspecified bronchus or lung: Secondary | ICD-10-CM | POA: Diagnosis present

## 2018-06-08 DIAGNOSIS — K5900 Constipation, unspecified: Secondary | ICD-10-CM | POA: Diagnosis present

## 2018-06-08 DIAGNOSIS — M898X1 Other specified disorders of bone, shoulder: Secondary | ICD-10-CM | POA: Diagnosis not present

## 2018-06-08 DIAGNOSIS — Z5111 Encounter for antineoplastic chemotherapy: Secondary | ICD-10-CM

## 2018-06-08 DIAGNOSIS — Z7982 Long term (current) use of aspirin: Secondary | ICD-10-CM

## 2018-06-08 DIAGNOSIS — D6859 Other primary thrombophilia: Secondary | ICD-10-CM | POA: Diagnosis present

## 2018-06-08 DIAGNOSIS — D509 Iron deficiency anemia, unspecified: Secondary | ICD-10-CM | POA: Diagnosis present

## 2018-06-08 DIAGNOSIS — R0602 Shortness of breath: Secondary | ICD-10-CM

## 2018-06-08 DIAGNOSIS — K521 Toxic gastroenteritis and colitis: Secondary | ICD-10-CM | POA: Diagnosis not present

## 2018-06-08 DIAGNOSIS — E860 Dehydration: Secondary | ICD-10-CM

## 2018-06-08 DIAGNOSIS — C787 Secondary malignant neoplasm of liver and intrahepatic bile duct: Secondary | ICD-10-CM

## 2018-06-08 DIAGNOSIS — C7931 Secondary malignant neoplasm of brain: Secondary | ICD-10-CM

## 2018-06-08 DIAGNOSIS — K219 Gastro-esophageal reflux disease without esophagitis: Secondary | ICD-10-CM | POA: Diagnosis present

## 2018-06-08 DIAGNOSIS — Z888 Allergy status to other drugs, medicaments and biological substances status: Secondary | ICD-10-CM

## 2018-06-08 DIAGNOSIS — E872 Acidosis: Secondary | ICD-10-CM | POA: Diagnosis present

## 2018-06-08 DIAGNOSIS — E440 Moderate protein-calorie malnutrition: Secondary | ICD-10-CM

## 2018-06-08 DIAGNOSIS — Z5112 Encounter for antineoplastic immunotherapy: Secondary | ICD-10-CM | POA: Insufficient documentation

## 2018-06-08 DIAGNOSIS — C7970 Secondary malignant neoplasm of unspecified adrenal gland: Secondary | ICD-10-CM

## 2018-06-08 DIAGNOSIS — R392 Extrarenal uremia: Secondary | ICD-10-CM | POA: Diagnosis not present

## 2018-06-08 DIAGNOSIS — R29703 NIHSS score 3: Secondary | ICD-10-CM | POA: Diagnosis not present

## 2018-06-08 DIAGNOSIS — G893 Neoplasm related pain (acute) (chronic): Secondary | ICD-10-CM

## 2018-06-08 DIAGNOSIS — F419 Anxiety disorder, unspecified: Secondary | ICD-10-CM | POA: Diagnosis present

## 2018-06-08 DIAGNOSIS — Z510 Encounter for antineoplastic radiation therapy: Secondary | ICD-10-CM | POA: Insufficient documentation

## 2018-06-08 DIAGNOSIS — E43 Unspecified severe protein-calorie malnutrition: Secondary | ICD-10-CM | POA: Diagnosis present

## 2018-06-08 DIAGNOSIS — L89152 Pressure ulcer of sacral region, stage 2: Secondary | ICD-10-CM | POA: Diagnosis present

## 2018-06-08 DIAGNOSIS — I080 Rheumatic disorders of both mitral and aortic valves: Secondary | ICD-10-CM | POA: Diagnosis present

## 2018-06-08 DIAGNOSIS — N183 Chronic kidney disease, stage 3 (moderate): Secondary | ICD-10-CM | POA: Diagnosis present

## 2018-06-08 DIAGNOSIS — R197 Diarrhea, unspecified: Secondary | ICD-10-CM | POA: Diagnosis not present

## 2018-06-08 DIAGNOSIS — Z79891 Long term (current) use of opiate analgesic: Secondary | ICD-10-CM

## 2018-06-08 DIAGNOSIS — N289 Disorder of kidney and ureter, unspecified: Secondary | ICD-10-CM

## 2018-06-08 DIAGNOSIS — E785 Hyperlipidemia, unspecified: Secondary | ICD-10-CM | POA: Diagnosis present

## 2018-06-08 DIAGNOSIS — I129 Hypertensive chronic kidney disease with stage 1 through stage 4 chronic kidney disease, or unspecified chronic kidney disease: Secondary | ICD-10-CM | POA: Diagnosis present

## 2018-06-08 DIAGNOSIS — Z515 Encounter for palliative care: Secondary | ICD-10-CM | POA: Diagnosis present

## 2018-06-08 DIAGNOSIS — D638 Anemia in other chronic diseases classified elsewhere: Secondary | ICD-10-CM | POA: Diagnosis present

## 2018-06-08 DIAGNOSIS — K529 Noninfective gastroenteritis and colitis, unspecified: Secondary | ICD-10-CM | POA: Diagnosis not present

## 2018-06-08 DIAGNOSIS — M25511 Pain in right shoulder: Secondary | ICD-10-CM | POA: Diagnosis not present

## 2018-06-08 DIAGNOSIS — G9341 Metabolic encephalopathy: Secondary | ICD-10-CM | POA: Diagnosis present

## 2018-06-08 DIAGNOSIS — N179 Acute kidney failure, unspecified: Secondary | ICD-10-CM | POA: Diagnosis present

## 2018-06-08 DIAGNOSIS — Z6820 Body mass index (BMI) 20.0-20.9, adult: Secondary | ICD-10-CM

## 2018-06-08 DIAGNOSIS — R29702 NIHSS score 2: Secondary | ICD-10-CM | POA: Diagnosis not present

## 2018-06-08 DIAGNOSIS — T451X5A Adverse effect of antineoplastic and immunosuppressive drugs, initial encounter: Secondary | ICD-10-CM | POA: Diagnosis present

## 2018-06-08 DIAGNOSIS — R29701 NIHSS score 1: Secondary | ICD-10-CM | POA: Diagnosis not present

## 2018-06-08 MED ORDER — SODIUM CHLORIDE 0.9 % IV SOLN
Freq: Once | INTRAVENOUS | Status: AC
  Administered 2018-06-08: 10:00:00 via INTRAVENOUS
  Filled 2018-06-08: qty 250

## 2018-06-08 MED ORDER — ONDANSETRON HCL 4 MG PO TABS
8.0000 mg | ORAL_TABLET | Freq: Once | ORAL | Status: AC
  Administered 2018-06-08: 8 mg via ORAL
  Filled 2018-06-08: qty 2

## 2018-06-08 MED ORDER — ONDANSETRON HCL 8 MG PO TABS: 8.0000 mg | ORAL_TABLET | Freq: Three times a day (TID) | ORAL | 3 refills | Status: AC | PRN

## 2018-06-08 MED ORDER — MORPHINE SULFATE (PF) 4 MG/ML IV SOLN
2.0000 mg | Freq: Once | INTRAVENOUS | Status: AC
  Administered 2018-06-08: 2 mg via INTRAMUSCULAR
  Filled 2018-06-08: qty 0.5

## 2018-06-08 MED ORDER — SODIUM CHLORIDE 0.9 % IV SOLN
Freq: Once | INTRAVENOUS | Status: DC
  Filled 2018-06-08: qty 1000

## 2018-06-08 NOTE — Patient Instructions (Signed)
Dehydration, Adult Dehydration is when there is not enough fluid or water in your body. This happens when you lose more fluids than you take in. Dehydration can range from mild to very bad. It should be treated right away to keep it from getting very bad. Symptoms of mild dehydration may include:  Thirst.  Dry lips.  Slightly dry mouth.  Dry, warm skin.  Dizziness. Symptoms of moderate dehydration may include:  Very dry mouth.  Muscle cramps.  Dark pee (urine). Pee may be the color of tea.  Your body making less pee.  Your eyes making fewer tears.  Heartbeat that is uneven or faster than normal (palpitations).  Headache.  Light-headedness, especially when you stand up from sitting.  Fainting (syncope). Symptoms of very bad dehydration may include:  Changes in skin, such as: ? Cold and clammy skin. ? Blotchy (mottled) or pale skin. ? Skin that does not quickly return to normal after being lightly pinched and let go (poor skin turgor).  Changes in body fluids, such as: ? Feeling very thirsty. ? Your eyes making fewer tears. ? Not sweating when body temperature is high, such as in hot weather. ? Your body making very little pee.  Changes in vital signs, such as: ? Weak pulse. ? Pulse that is more than 100 beats a minute when you are sitting still. ? Fast breathing. ? Low blood pressure.  Other changes, such as: ? Sunken eyes. ? Cold hands and feet. ? Confusion. ? Lack of energy (lethargy). ? Trouble waking up from sleep. ? Short-term weight loss. ? Unconsciousness. Follow these instructions at home:  If told by your doctor, drink an ORS: ? Make an ORS by using instructions on the package. ? Start by drinking small amounts, about  cup (120 mL) every 5-10 minutes. ? Slowly drink more until you have had the amount that your doctor said to have.  Drink enough clear fluid to keep your pee clear or pale yellow. If you were told to drink an ORS, finish the ORS  first, then start slowly drinking clear fluids. Drink fluids such as: ? Water. Do not drink only water by itself. Doing that can make the salt (sodium) level in your body get too low (hyponatremia). ? Ice chips. ? Fruit juice that you have added water to (diluted). ? Low-calorie sports drinks.  Avoid: ? Alcohol. ? Drinks that have a lot of sugar. These include high-calorie sports drinks, fruit juice that does not have water added, and soda. ? Caffeine. ? Foods that are greasy or have a lot of fat or sugar.  Take over-the-counter and prescription medicines only as told by your doctor.  Do not take salt tablets. Doing that can make the salt level in your body get too high (hypernatremia).  Eat foods that have minerals (electrolytes). Examples include bananas, oranges, potatoes, tomatoes, and spinach.  Keep all follow-up visits as told by your doctor. This is important. Contact a doctor if:  You have belly (abdominal) pain that: ? Gets worse. ? Stays in one area (localizes).  You have a rash.  You have a stiff neck.  You get angry or annoyed more easily than normal (irritability).  You are more sleepy than normal.  You have a harder time waking up than normal.  You feel: ? Weak. ? Dizzy. ? Very thirsty.  You have peed (urinated) only a small amount of very dark pee during 6-8 hours. Get help right away if:  You have symptoms of   very bad dehydration.  You cannot drink fluids without throwing up (vomiting).  Your symptoms get worse with treatment.  You have a fever.  You have a very bad headache.  You are throwing up or having watery poop (diarrhea) and it: ? Gets worse. ? Does not go away.  You have blood or something green (bile) in your throw-up.  You have blood in your poop (stool). This may cause poop to look black and tarry.  You have not peed in 6-8 hours.  You pass out (faint).  Your heart rate when you are sitting still is more than 100 beats a  minute.  You have trouble breathing. This information is not intended to replace advice given to you by your health care provider. Make sure you discuss any questions you have with your health care provider. Document Released: 08/14/2009 Document Revised: 05/07/2016 Document Reviewed: 12/12/2015 [employer] Interactive Patient Education  2018 [employer]  

## 2018-06-08 NOTE — Progress Notes (Addendum)
Radiation Oncology         [phone]) [phone] ________________________________  Name: Zachary Hobbs        MRN: [id number]  Date of Service: 06/08/2018 DOB: 1941-09-08  OJ:[id number], Hinton Dyer DO  Willis, [place], DO     REFERRING PHYSICIAN: Janie Morning, DO   DIAGNOSIS: The primary encounter diagnosis was Primary malignant neoplasm of left lung metastatic to other site [facility]). A diagnosis of Malignant neoplasm of upper lobe of left lung (HCC) was also pertinent to this visit.   HISTORY OF PRESENT ILLNESS: Zachary Hobbs is a 77 y.o. male seen at the request of Dr. Julien Nordmann for a new diagnosis of stage IV lung cancer. He had been experiencing increasing fatigue, weight loss, and presented to the ED due to hemoptysis. He was found with CT of the neck, C/A/P on 05/18/18 that revealed innumerable lung nodules with a dominant LUL lesion measuring 5.3 x 2.4 cm as well as concerns for hepatic masses and bone lesions in the left third and fourth ribs. There was concern for a left adrenal lesion. He had a PET scan on 05/29/18 revealing multiple areas of disease including the lungs, bones, liver, and bilateral adrenal glands. He underwent an MRI brain and could not stand to lay flat long enough for contrast due to his right shoulder pain. He had a biopsy under CT guidance of the left lung on 06/02/18 that revealed an adenocarcinoma consistent with NSCLC. He had a repeat attempt for MRI yesterday that was limited by lack of contrast again due to his shoulder pain, and a 2 cm right temporal lobe lesion was noted, a 7 mm right posterior frontal vertex lesion, a 7-8 mm left corpus callosum lesion, and an 8 mm left parietal vertex lesion was noted. He has plans to begin systemic therapy and will meet with Dr. Julien Nordmann tomorrow to review this. He comes today to discuss options of palliative radiotherapy to the shoulder and given the findings in the brain.    PREVIOUS RADIATION THERAPY: No   PAST MEDICAL HISTORY:  Past Medical  History:  Diagnosis Date  . Hypertension        PAST SURGICAL HISTORY: Past Surgical History:  Procedure Laterality Date  . COLONOSCOPY       FAMILY HISTORY:  Family History  Problem Relation Age of Onset  . Mesothelioma Sister      SOCIAL HISTORY:  reports that he has never smoked. He has never used smokeless tobacco. He reports that he does not drink alcohol or use drugs.   ALLERGIES: Rosuvastatin   MEDICATIONS:  Current Outpatient Medications  Medication Sig Dispense Refill  . aspirin EC 81 MG tablet Take 81 mg by mouth daily.    . meclizine (ANTIVERT) 50 MG tablet Take 1 tablet (50 mg total) by mouth 3 (three) times daily as needed. (Patient taking differently: Take 50 mg by mouth 3 (three) times daily as needed for dizziness. ) 30 tablet 0  . omeprazole (PRILOSEC OTC) 20 MG tablet Take 20 mg by mouth daily.    Marland Kitchen oxyCODONE-acetaminophen (PERCOCET/ROXICET) 5-325 MG tablet Take 1 tablet by mouth every 6 (six) hours as needed for severe pain. 30 tablet 0  . simvastatin (ZOCOR) 40 MG tablet Take 40 mg by mouth every evening.    . traMADol (ULTRAM) 50 MG tablet Take 25 mg by mouth 3 (three) times daily as needed.  0  . valsartan-hydrochlorothiazide (DIOVAN-HCT) 320-25 MG tablet Take 1 tablet by mouth daily.    Marland Kitchen  ondansetron (ZOFRAN) 8 MG tablet Take 1 tablet (8 mg total) by mouth every 8 (eight) hours as needed for nausea or vomiting. 30 tablet 3   Current Facility-Administered Medications  Medication Dose Route Frequency Provider Last Rate Last Dose  . 0.9 %  sodium chloride infusion   Intravenous Once Hayden Pedro, PA-C         REVIEW OF SYSTEMS: On review of systems, the patient reports that He is doing fair at this time. He is not eating well at all due to changes in taste perception. He has had about 50 pounds of weight loss overall in the last 6-8 months. He denies any hemoptysis, chest pain, shortness of breath, cough, fevers, chills, night sweats. He  denies any bowel or bladder disturbances, though he is not taking in much and his last bowel movement was about a week ago. He denies abdominal pain, nausea or vomiting. He describes pain along the upper aspect of the right shoulder and had nausea after taking oxycodone yesterday, and reports it has not caused a loss of sensation or weakness. He denies any other new musculoskeletal or joint aches or pains. A complete review of systems is obtained and is otherwise negative.   PHYSICAL EXAM:  Wt Readings from Last 3 Encounters:  06/08/18 137 lb 6.4 oz (62.3 kg)  06/02/18 137 lb (62.1 kg)  05/24/18 149 lb 14.4 oz (68 kg)   Temp Readings from Last 3 Encounters:  06/08/18 (!) 97.5 F (36.4 C) (Oral)  06/02/18 97.9 F (36.6 C)  05/24/18 (!) 97.5 F (36.4 C)   BP Readings from Last 3 Encounters:  06/08/18 (!) 88/52  06/02/18 107/64  05/24/18 (!) 141/84   Pulse Readings from Last 3 Encounters:  06/08/18 (!) 109  06/02/18 83  05/24/18 98   Pain Assessment Pain Score: 8 (Right shoulder pain)/10  In general this is a thin, chronically ill appearing African American male in no acute distress. He is alert and oriented x4 and appropriate throughout the examination. HEENT reveals that the patient is normocephalic, atraumatic. EOMs are intact.  Skin is intact without any evidence of gross lesions. Cardiovascular exam reveals a regular rate and rhythm, no clicks rubs or murmurs are auscultated. Chest is clear to auscultation bilaterally. Lymphatic assessment is performed and does not reveal any adenopathy in the cervical, supraclavicular, axillary, or inguinal chains. Abdomen has active bowel sounds in all quadrants and is intact. The abdomen is soft, non tender, non distended. Lower extremities are negative for pretibial pitting edema, deep calf tenderness, cyanosis or clubbing. He has point tenderness over the glenohumeral joint on the right.    ECOG = 1  0 - Asymptomatic (Fully active, able to  carry on all predisease activities without restriction)  1 - Symptomatic but completely ambulatory (Restricted in physically strenuous activity but ambulatory and able to carry out work of a light or sedentary nature. For example, light housework, office work)  2 - Symptomatic, <50% in bed during the day (Ambulatory and capable of all self care but unable to carry out any work activities. Up and about more than 50% of waking hours)  3 - Symptomatic, >50% in bed, but not bedbound (Capable of only limited self-care, confined to bed or chair 50% or more of waking hours)  4 - Bedbound (Completely disabled. Cannot carry on any self-care. Totally confined to bed or chair)  5 - Death   Eustace Pen MM, Creech RH, Tormey DC, et al. [phone]). "Toxicity and response criteria  of the [facility]". [place] Oncol. 5 (6): 649-55    LABORATORY DATA:  Lab Results  Component Value Date   WBC 11.0 (H) 06/02/2018   HGB 10.1 (L) 06/02/2018   HCT 32.8 (L) 06/02/2018   MCV 75.8 (L) 06/02/2018   PLT 433 (H) 06/02/2018   Lab Results  Component Value Date   NA 141 05/18/2018   K 4.8 05/18/2018   CL 104 05/18/2018   CO2 25 05/18/2018   Lab Results  Component Value Date   ALT 54 (H) 05/18/2018   AST 31 05/18/2018   ALKPHOS 113 05/18/2018   BILITOT 0.7 05/18/2018      RADIOGRAPHY: Ct Soft Tissue Neck W Contrast  Result Date: 05/18/2018 CLINICAL DATA:  Weight loss. EXAM: CT NECK WITH CONTRAST TECHNIQUE: Multidetector CT imaging of the neck was performed using the standard protocol following the bolus administration of intravenous contrast. CONTRAST:  38mL ISOVUE-370 IOPAMIDOL (ISOVUE-370) INJECTION 76% COMPARISON:  CT chest, abdomen, pelvis reported separately. FINDINGS: Pharynx and larynx: Normal. No mass or swelling. Salivary glands: No inflammation, mass, or stone. Thyroid: Normal. Lymph nodes: None enlarged or abnormal density. Vascular: Atherosclerosis. Limited intracranial: Not  assessed. Visualized orbits: Partially visualized, negative. Mastoids and visualized paranasal sinuses: Clear. Skeleton: Cervical spondylosis. Periapical lucencies suggesting dental disease. Upper chest: Reported separately.  Multiple masses are identified. Other: None. IMPRESSION: No visible head/neck primary or malignant appearing cervical adenopathy. Carotid bifurcation atherosclerosis. Cervical spondylosis, and dental disease. Electronically Signed   By: Staci Righter M.D.   On: 05/18/2018 13:28   Ct Angio Chest Pe W And/or Wo Contrast  Result Date: 05/18/2018 CLINICAL DATA:  Significant weight loss. Smoking history. Abnormal chest radiograph EXAM: CT ANGIOGRAPHY CHEST CT ABDOMEN AND PELVIS WITH CONTRAST TECHNIQUE: Multidetector CT imaging of the chest was performed using the standard protocol during bolus administration of intravenous contrast. Multiplanar CT image reconstructions and MIPs were obtained to evaluate the vascular anatomy. Multidetector CT imaging of the abdomen and pelvis was performed using the standard protocol during bolus administration of intravenous contrast. CONTRAST:  22mL ISOVUE-370 IOPAMIDOL (ISOVUE-370) INJECTION 76% COMPARISON:  Chest radiograph 05/18/2018 FINDINGS: CTA CHEST FINDINGS Cardiovascular: No filling defects within the pulmonary arteries to suggest acute pulmonary embolism. No significant vascular findings. Normal heart size. No pericardial effusion. Mediastinum/Nodes: No axillary or supraclavicular adenopathy. Bilateral hilar nodal enlargement with 1.5 cm hilar nodes. Esophagus normal. Lungs/Pleura: Innumerable bilateral pulmonary nodules of varying size ranging from 5 mm to 20 mm. Approximately 15 nodules per lung. There is a pleural base mass in the LEFT upper lobe laterally measuring 53 x 24 mm. There is osseous invasion of adjacent LEFT third fourth ribs anteriorly (image 44/8 adjacent to this pleural base mass per Musculoskeletal: Chest wall invasion in the  anterior LEFT chest involving the anterior aspect of the third and fourth ribs with mottled appearance of the ribs and pathologic fracture medially in the third rib. This is just superior to the pleural-based LEFT upper lobe mass. Review of the MIP images confirms the above findings. CT ABDOMEN and PELVIS FINDINGS Hepatobiliary: Dilute density well-circumscribed ovoid lesion in the superior aspect of the LEFT hepatic lobe (segment 4A) measures 3.2 x 2.4 cm. Tiny 2 mm lesion inferior RIGHT hepatic lobe (image 30/4 normal gallbladder. Pancreas: Pancreas is normal. No ductal dilatation. No pancreatic inflammation. Spleen: Normal spleen Adrenals/urinary tract: There is nodule enlargement of the LEFT adrenal gland to 19 x 25 mm mild thickening of the RIGHT adrenal gland without discrete  nodularity kidneys normal. Ureters and bladder normal Stomach/Bowel: Stomach, small-bowel cecum normal. Ascending, transverse, and descending colon normal. Rectosigmoid colon normal. No obstruction or mass. Vascular/Lymphatic: Abdominal aorta is normal caliber with atherosclerotic calcification. There is no retroperitoneal or periportal lymphadenopathy. No pelvic lymphadenopathy. Reproductive: Prostate normal Other: No peritoneal metastasis or omental thickening Musculoskeletal: No aggressive osseous lesion. Review of the MIP images confirms the above findings. IMPRESSION: Chest Impression: 1. No acute pulmonary embolism. 2. Bilateral pulmonary metastasis with innumerable nodules in both lungs. 3. Pleural base mass in the LEFT upper lobe could represent a metastatic deposit or potential primary lesion. 4. LEFT chest wall invasion adjacent to pleural base mass involving the third and fourth ribs anteriorly. Abdomen / Pelvis Impression: 1. Solitary low-density lesion in the LEFT hepatic lobe is concerning for hepatic metastasis. 2. LEFT adrenal gland metastasis. 3. No evidence of colorectal primary. This pattern of metastatic disease is  suggestive of melanoma or small cell lung carcinoma. Recommend tissue sampling Electronically Signed   By: Suzy Bouchard M.D.   On: 05/18/2018 13:50   Mr Brain Wo Contrast  Result Date: 06/07/2018 CLINICAL DATA:  New diagnosis lung cancer. Patient not able to tolerate complete imaging including contrast administration. EXAM: MRI HEAD WITHOUT CONTRAST TECHNIQUE: Multiplanar, multiecho pulse sequences of the brain and surrounding structures were obtained without intravenous contrast. COMPARISON:  05/29/2018.  CT 02/12/2012. FINDINGS: Brain: The patient was not able to tolerate complete imaging. No metastatic lesion is suspected in the cerebellum. There is a large venous angioma on the right seen previously, not clinically significant. With respect to the cerebral hemispheres, there is a 2 cm mass in the right temporal lobe consistent with a metastasis. Question the presence of a 7 mm metastasis at a right posterior frontal gyrus at the vertex. In the left hemisphere, there is a 7-8 mm metastasis in the region of the corpus callosum or subependymal brain, bulging into the body of the left lateral ventricle. There is an 8 mm metastasis at the left parietal vertex. There is some associated edema, particularly related to the right temporal metastasis. No mass effect or shift however. No sign of hemorrhage. No sign of acute or subacute infarction. Ordinary chronic small-vessel ischemic changes affect the white matter. No sign of obstructive hydrocephalus or extra-axial collection. Vascular: Major vessels at the base of the brain show flow. Skull and upper cervical spine: No skull base or calvarial metastasis is identified. Question abnormal C3 vertebral body. Sinuses/Orbits: Clear/normal Other: None IMPRESSION: Abbreviated exam. At least 4 metastases identified. 2 cm right temporal lobe metastasis with some surrounding edema. Question 7 mm metastasis at the right posterior frontal vertex. 7-8 mm metastasis of the  left corpus callosum or subependymal brain. 8 mm metastasis at the left parietal vertex. Question abnormal C3 vertebral body. Electronically Signed   By: Nelson Chimes M.D.   On: 06/07/2018 10:25   Mr Brain Wo Contrast  Result Date: 05/30/2018 CLINICAL DATA:  Lung cancer staging. EXAM: MRI HEAD WITHOUT CONTRAST TECHNIQUE: Sagittal T1 sequence. COMPARISON:  CT HEAD January 26, 2011 FINDINGS: Technologist report patient was in too much pain to continue examination and study was terminated. Brain: Limited assessment. No significant mass effect. No hydrocephalus. No cerebellar tonsillar ectopia. No abnormal intrinsic T1 shortening. Vascular: Nondiagnostic. Skull and upper cervical spine: No suspicious calvarial bone marrow signal. Sinuses/Orbits: Nondiagnostic. Other: None. IMPRESSION: Limited sagittal T1 sequence. Nondiagnostic assessment for metastasis. Consider repeat examination with appropriate medication. Electronically Signed   By: Thana Farr.D.  On: 05/30/2018 02:54   Ct Abdomen Pelvis W Contrast  Result Date: 05/18/2018 CLINICAL DATA:  Significant weight loss. Smoking history. Abnormal chest radiograph EXAM: CT ANGIOGRAPHY CHEST CT ABDOMEN AND PELVIS WITH CONTRAST TECHNIQUE: Multidetector CT imaging of the chest was performed using the standard protocol during bolus administration of intravenous contrast. Multiplanar CT image reconstructions and MIPs were obtained to evaluate the vascular anatomy. Multidetector CT imaging of the abdomen and pelvis was performed using the standard protocol during bolus administration of intravenous contrast. CONTRAST:  22mL ISOVUE-370 IOPAMIDOL (ISOVUE-370) INJECTION 76% COMPARISON:  Chest radiograph 05/18/2018 FINDINGS: CTA CHEST FINDINGS Cardiovascular: No filling defects within the pulmonary arteries to suggest acute pulmonary embolism. No significant vascular findings. Normal heart size. No pericardial effusion. Mediastinum/Nodes: No axillary or  supraclavicular adenopathy. Bilateral hilar nodal enlargement with 1.5 cm hilar nodes. Esophagus normal. Lungs/Pleura: Innumerable bilateral pulmonary nodules of varying size ranging from 5 mm to 20 mm. Approximately 15 nodules per lung. There is a pleural base mass in the LEFT upper lobe laterally measuring 53 x 24 mm. There is osseous invasion of adjacent LEFT third fourth ribs anteriorly (image 44/8 adjacent to this pleural base mass per Musculoskeletal: Chest wall invasion in the anterior LEFT chest involving the anterior aspect of the third and fourth ribs with mottled appearance of the ribs and pathologic fracture medially in the third rib. This is just superior to the pleural-based LEFT upper lobe mass. Review of the MIP images confirms the above findings. CT ABDOMEN and PELVIS FINDINGS Hepatobiliary: Dilute density well-circumscribed ovoid lesion in the superior aspect of the LEFT hepatic lobe (segment 4A) measures 3.2 x 2.4 cm. Tiny 2 mm lesion inferior RIGHT hepatic lobe (image 30/4 normal gallbladder. Pancreas: Pancreas is normal. No ductal dilatation. No pancreatic inflammation. Spleen: Normal spleen Adrenals/urinary tract: There is nodule enlargement of the LEFT adrenal gland to 19 x 25 mm mild thickening of the RIGHT adrenal gland without discrete nodularity kidneys normal. Ureters and bladder normal Stomach/Bowel: Stomach, small-bowel cecum normal. Ascending, transverse, and descending colon normal. Rectosigmoid colon normal. No obstruction or mass. Vascular/Lymphatic: Abdominal aorta is normal caliber with atherosclerotic calcification. There is no retroperitoneal or periportal lymphadenopathy. No pelvic lymphadenopathy. Reproductive: Prostate normal Other: No peritoneal metastasis or omental thickening Musculoskeletal: No aggressive osseous lesion. Review of the MIP images confirms the above findings. IMPRESSION: Chest Impression: 1. No acute pulmonary embolism. 2. Bilateral pulmonary metastasis  with innumerable nodules in both lungs. 3. Pleural base mass in the LEFT upper lobe could represent a metastatic deposit or potential primary lesion. 4. LEFT chest wall invasion adjacent to pleural base mass involving the third and fourth ribs anteriorly. Abdomen / Pelvis Impression: 1. Solitary low-density lesion in the LEFT hepatic lobe is concerning for hepatic metastasis. 2. LEFT adrenal gland metastasis. 3. No evidence of colorectal primary. This pattern of metastatic disease is suggestive of melanoma or small cell lung carcinoma. Recommend tissue sampling Electronically Signed   By: Suzy Bouchard M.D.   On: 05/18/2018 13:50   Nm Pet Image Initial (pi) Skull Base To Thigh  Result Date: 05/29/2018 CLINICAL DATA:  Initial treatment strategy for non-small cell lung cancer. EXAM: NUCLEAR MEDICINE PET SKULL BASE TO THIGH TECHNIQUE: 7.97 mCi F-18 FDG was injected intravenously. Full-ring PET imaging was performed from the skull base to thigh after the radiotracer. CT data was obtained and used for attenuation correction and anatomic localization. Fasting blood glucose: 106 mg/dl COMPARISON:  CT scans 05/18/2018 FINDINGS: Mediastinal blood pool activity: SUV max 2.60  NECK: No hypermetabolic lymph nodes in the neck. Incidental CT findings: none CHEST: The 5 x 3 cm peripheral left upper lobe lung mass versus pleural mass is markedly hypermetabolic with SUV max of 95.28. Findings consistent with associated chest wall invasion involving and partially destroying the third and fourth left anterior ribs with pathologic fractures. Diffuse pulmonary metastatic disease with innumerable hypermetabolic pulmonary lesions throughout both lungs. 2.6 cm index lesion at the left lung base has an SUV max of 21.37. Index lesion in the right lower lobe on image number 42 measures 18 mm and SUV max is 16.46. Right hilar adenopathy is hypermetabolic with SUV max of 41.32. Left hilar adenopathy is hypermetabolic with SUV max of  16.75. 12.5 mm left subpectoral lymph node on image 56 has an SUV max of 16.08. Incidental CT findings: none ABDOMEN/PELVIS: 3 cm segment 2 liver lesion is hypermetabolic with SUV max of 44.01. Bilateral adrenal gland metastasis. The 2.2 cm left adrenal gland lesion has an SUV max of 23.29 and the 15 mm right adrenal gland lesion has an SUV max of 12.62. No metastatic lymphadenopathy. Incidental CT findings: none SKELETON: Diffuse osseous metastatic disease involving the axial and appendicular skeleton. No spinal canal compromise is identified. No pathologic fractures other than the left third and fourth anterior ribs but those are involved with direct invasion of tumor. Incidental CT findings: none IMPRESSION: 1. Large hypermetabolic peripheral left lung mass invading the chest wall and adjacent third and fourth anterior ribs consistent with known neoplasm. 2. Diffuse pulmonary metastatic disease. 3. Bilateral hilar adenopathy. 4. Diffuse osseous metastatic disease. 5. Single metastatic focus in the left hepatic lobe and bilateral adrenal gland metastasis. Electronically Signed   By: Marijo Sanes M.D.   On: 05/29/2018 17:06   Ct Biopsy  Result Date: 06/02/2018 INDICATION: 77 year old male with extensive multifocal metastatic disease which may be widely metastatic lung cancer. The dominant mass is pleural based in the periphery of the left upper lobe. EXAM: CT-guided biopsy left lobe pulmonary nodule Interventional Radiologist:  Criselda Peaches, MD MEDICATIONS: None. ANESTHESIA/SEDATION: Fentanyl 100 mcg IV; Versed 2 mg IV Moderate Sedation Time:  11 minutes The patient was continuously monitored during the procedure by the interventional radiology nurse under my direct supervision. FLUOROSCOPY TIME:  Fluoroscopy Time: 0 minutes 0 seconds (0 mGy). COMPLICATIONS: None immediate. Estimated blood loss:  0 PROCEDURE: Informed written consent was obtained from the patient after a thorough discussion of the  procedural risks, benefits and alternatives. All questions were addressed. Maximal Sterile Barrier Technique was utilized including caps, mask, sterile gowns, sterile gloves, sterile drape, hand hygiene and skin antiseptic. A timeout was performed prior to the initiation of the procedure. A planning axial CT scan was performed. The pleural base mass in the periphery of the left upper lobe was successfully identified. A suitable skin entry site was selected and marked. The region was then sterilely prepped and draped in standard fashion using Betadine skin prep. Local anesthesia was attained by infiltration with 1% lidocaine. A small dermatotomy was made. Under intermittent CT fluoroscopic guidance, a 17 gauge trocar needle was advanced into the lung and positioned at the margin of the nodule. Multiple 18 gauge core biopsies were then coaxially obtained using the BioPince automated biopsy device. Biopsy specimens were placed in formalin and delivered to pathology for further analysis. The biopsy device and introducer needle were removed. Post biopsy axial CT imaging demonstrates no evidence of immediate complication. There is no pneumothorax. Mild perilesional alveolar hemorrhage is  not unexpected. The patient tolerated the procedure well. IMPRESSION: Technically successful CT-guided biopsy of pleural based left upper lobe pulmonary mass. Electronically Signed   By: Jacqulynn Cadet M.D.   On: 06/02/2018 13:33   Dg Chest Port 1 View  Result Date: 06/02/2018 CLINICAL DATA:  77 year old male status post CT-guided left lung biopsy at 1230 hours today. EXAM: PORTABLE CHEST 1 VIEW COMPARISON:  CT biopsy images today.  PET-CT 05/29/2018. FINDINGS: Portable AP semi upright view at 1317 hours. The biopsied left lateral lung rounded mass appears stable in size and configuration. No pneumothorax or pleural effusion. Numerous additional bilateral pulmonary nodules redemonstrated. Stable cardiac size and mediastinal  contours. Visualized tracheal air column is within normal limits. No new pulmonary opacity. Negative visible bowel gas pattern. Stable visualized osseous structures. IMPRESSION: No pneumothorax or new abnormality status post left lung CT-guided biopsy. Electronically Signed   By: Genevie Ann M.D.   On: 06/02/2018 13:28   Dg Chest Portable 1 View  Result Date: 05/18/2018 CLINICAL DATA:  Chest pain. EXAM: PORTABLE CHEST 1 VIEW COMPARISON:  02/13/2012. FINDINGS: Mediastinum and hilar structures are normal. Cardiomegaly with normal pulmonary vascularity. Multiple bilateral pulmonary nodules are noted. These are worrisome for metastatic lesions. Large pleural-based densities are noted along the left upper pleural wall. These could also be metastatic. These findings can also be caused by infectious etiologies. Chest CT suggested for further evaluation. No pneumothorax. No acute bony abnormality. IMPRESSION: 1.  Multiple pulmonary nodules worrisome for metastatic disease. 2. Large pleural-based densities noted along the left upper pleural border. These could also be secondary to metastatic disease. IV contrast-enhanced chest CT suggested for further evaluation. Electronically Signed   By: Marcello Moores  Register   On: 05/18/2018 13:18       IMPRESSION/PLAN: 1. Stage IV, NSCLC, adenocarcinoma of the left lung with multiple sites of metastatic disease including contralateral lung, bilateral adrenal glands, bone and brain metastases. Dr. Lisbeth Renshaw discusses the pathology findings and reviews the nature of metastatic lung cancer. Dr. Lisbeth Renshaw would offer radiotherapy over 2 weeks to the right shoulder for his painful bone metastases, as well as stereotactic radiosurgery to the brain. We will repeat an MRI and hold any steroids at this time as he is asymptomatic from his brain disease. We would like to see him receive benefit for pain relief in the shoulder so we would aim to have an MRI in about 1 1/2-2 weeks. We discussed the risks,  benefits, short, and long term effects of radiotherapy, and the patient is interested in proceeding. Written consent is obtained and placed in the chart, a copy was provided to the patient. 2. Hypotension and dehydration. The patient needs to increase his oral intake and he is hypotensive today to warrant the use of IV hydration. We will set him up for 1L over 1 hour of NS. We will follow this expectantly. 3. Shoulder pain. Per #1. We've also given him a dose of morphine today, and we will hope his radiation improves his shoulder pain to where he can proceed with repeat MRI of the brain with [facility] treatment planning.  4. Protein calorie malnutrition. The patient will be referred to nutrition for evaluation. 5. Opioid induced nausea. A prescription was given for zofran and this was also administered prior to his morphine.   The above documentation reflects my direct findings during this shared patient visit. Please see the separate note by Dr. Lisbeth Renshaw on this date for the remainder of the patient's plan of care.  Carola Rhine, PAC

## 2018-06-09 ENCOUNTER — Encounter: Payer: Self-pay | Admitting: *Deleted

## 2018-06-09 ENCOUNTER — Inpatient Hospital Stay: Payer: 59

## 2018-06-09 ENCOUNTER — Inpatient Hospital Stay (HOSPITAL_BASED_OUTPATIENT_CLINIC_OR_DEPARTMENT_OTHER): Payer: 59 | Admitting: Internal Medicine

## 2018-06-09 ENCOUNTER — Other Ambulatory Visit: Payer: Self-pay | Admitting: Radiation Therapy

## 2018-06-09 ENCOUNTER — Telehealth: Payer: Self-pay | Admitting: Internal Medicine

## 2018-06-09 ENCOUNTER — Encounter: Payer: Self-pay | Admitting: Internal Medicine

## 2018-06-09 VITALS — BP 89/62 | HR 100 | Temp 97.9°F | Resp 18 | Ht 68.75 in | Wt 137.6 lb

## 2018-06-09 DIAGNOSIS — Z7189 Other specified counseling: Secondary | ICD-10-CM | POA: Insufficient documentation

## 2018-06-09 DIAGNOSIS — C787 Secondary malignant neoplasm of liver and intrahepatic bile duct: Secondary | ICD-10-CM

## 2018-06-09 DIAGNOSIS — C7970 Secondary malignant neoplasm of unspecified adrenal gland: Secondary | ICD-10-CM

## 2018-06-09 DIAGNOSIS — C7931 Secondary malignant neoplasm of brain: Secondary | ICD-10-CM | POA: Diagnosis not present

## 2018-06-09 DIAGNOSIS — C3412 Malignant neoplasm of upper lobe, left bronchus or lung: Secondary | ICD-10-CM

## 2018-06-09 DIAGNOSIS — C7801 Secondary malignant neoplasm of right lung: Secondary | ICD-10-CM | POA: Diagnosis not present

## 2018-06-09 DIAGNOSIS — Z5112 Encounter for antineoplastic immunotherapy: Secondary | ICD-10-CM | POA: Insufficient documentation

## 2018-06-09 DIAGNOSIS — C7802 Secondary malignant neoplasm of left lung: Secondary | ICD-10-CM

## 2018-06-09 DIAGNOSIS — C7951 Secondary malignant neoplasm of bone: Secondary | ICD-10-CM

## 2018-06-09 DIAGNOSIS — Z5111 Encounter for antineoplastic chemotherapy: Secondary | ICD-10-CM

## 2018-06-09 LAB — CBC WITH DIFFERENTIAL (CANCER CENTER ONLY)
BASOS ABS: 0 10*3/uL (ref 0.0–0.1)
BASOS PCT: 0 %
EOS ABS: 0.1 10*3/uL (ref 0.0–0.5)
EOS PCT: 1 %
HCT: 30.5 % — ABNORMAL LOW (ref 38.4–49.9)
Hemoglobin: 9.7 g/dL — ABNORMAL LOW (ref 13.0–17.1)
Lymphocytes Relative: 9 %
Lymphs Abs: 0.9 10*3/uL (ref 0.9–3.3)
MCH: 23.6 pg — ABNORMAL LOW (ref 27.2–33.4)
MCHC: 31.8 g/dL — ABNORMAL LOW (ref 32.0–36.0)
MCV: 74.2 fL — ABNORMAL LOW (ref 79.3–98.0)
Monocytes Absolute: 0.9 10*3/uL (ref 0.1–0.9)
Monocytes Relative: 9 %
Neutro Abs: 7.8 10*3/uL — ABNORMAL HIGH (ref 1.5–6.5)
Neutrophils Relative %: 81 %
PLATELETS: 420 10*3/uL — AB (ref 140–400)
RBC: 4.11 MIL/uL — ABNORMAL LOW (ref 4.20–5.82)
RDW: 15.2 % — ABNORMAL HIGH (ref 11.0–14.6)
WBC: 9.8 10*3/uL (ref 4.0–10.3)

## 2018-06-09 LAB — CMP (CANCER CENTER ONLY)
ALK PHOS: 211 U/L — AB (ref 38–126)
ALT: 82 U/L — AB (ref 0–44)
AST: 36 U/L (ref 15–41)
Albumin: 2.3 g/dL — ABNORMAL LOW (ref 3.5–5.0)
Anion gap: 17 — ABNORMAL HIGH (ref 5–15)
BILIRUBIN TOTAL: 0.7 mg/dL (ref 0.3–1.2)
BUN: 98 mg/dL — AB (ref 8–23)
CALCIUM: 10.7 mg/dL — AB (ref 8.9–10.3)
CO2: 19 mmol/L — ABNORMAL LOW (ref 22–32)
CREATININE: 2.41 mg/dL — AB (ref 0.61–1.24)
Chloride: 102 mmol/L (ref 98–111)
GFR, EST NON AFRICAN AMERICAN: 25 mL/min — AB (ref >60)
GFR, Est AFR Am: 29 mL/min — ABNORMAL LOW (ref >60)
Glucose, Bld: 101 mg/dL — ABNORMAL HIGH (ref 70–99)
Potassium: 4.7 mmol/L (ref 3.5–5.1)
Sodium: 138 mmol/L (ref 135–145)
TOTAL PROTEIN: 8.2 g/dL — AB (ref 6.5–8.1)

## 2018-06-09 LAB — TSH: TSH: 0.691 u[IU]/mL (ref 0.320–4.118)

## 2018-06-09 MED ORDER — FOLIC ACID 1 MG PO TABS: 1.0000 mg | ORAL_TABLET | Freq: Every day | ORAL | 4 refills | Status: DC

## 2018-06-09 MED ORDER — CYANOCOBALAMIN 1000 MCG/ML IJ SOLN
INTRAMUSCULAR | Status: AC
Start: 2018-06-09 — End: ?
  Filled 2018-06-09: qty 1

## 2018-06-09 MED ORDER — PROCHLORPERAZINE MALEATE 10 MG PO TABS: 10.0000 mg | ORAL_TABLET | Freq: Four times a day (QID) | ORAL | 0 refills | Status: AC | PRN

## 2018-06-09 MED ORDER — CYANOCOBALAMIN 1000 MCG/ML IJ SOLN
1000.0000 ug | Freq: Once | INTRAMUSCULAR | Status: AC
  Administered 2018-06-09: 1000 ug via INTRAMUSCULAR

## 2018-06-09 NOTE — Telephone Encounter (Signed)
Unable to schedule treatment/ in basket message sent to Zachary Hobbs/ Zachary Hobbs regarding New Chemo Start for 8/15/ I also advised [name] of this as well.  I informed patient that I woul call them when appointments were scheduled/ per 8/9 los

## 2018-06-09 NOTE — Progress Notes (Signed)
START ON PATHWAY REGIMEN - Non-Small Cell Lung     A cycle is every 21 days:     Pembrolizumab      Pemetrexed      Carboplatin   **Always confirm dose/schedule in your pharmacy ordering system**  Patient Characteristics: Stage IV Metastatic, Nonsquamous, Initial Chemotherapy/Immunotherapy, PS = 0, 1, ALK Translocation Negative/Unknown and EGFR Mutation Negative/Non-Sensitizing/Unknown, PD-L1 Expression Positive 1-49% (TPS) / Negative / Not Tested / Awaiting Test Results  and Immunotherapy Candidate AJCC T Category: T2a Current Disease Status: Distant Metastases AJCC N Category: N3 AJCC M Category: M1c AJCC 8 Stage Grouping: IVB Histology: Nonsquamous Cell ROS1 Rearrangement Status: Awaiting Test Results [id number] Mutation Status: Not Applicable - EGFR Mutation Negative/Unknown Other Mutations/Biomarkers: No Other Actionable Mutations NTRK Gene Fusion Status: Awaiting Test Results PD-L1 Expression Status: Awaiting Test Results Chemotherapy/Immunotherapy LOT: Initial Chemotherapy/Immunotherapy Molecular Targeted Therapy: Not Appropriate ALK Translocation Status: Awaiting Test Results EGFR Mutation Status: Awaiting Test Results BRAF V600E Mutation Status: Awaiting Test Results Performance Status: PS = 0, 1 Immunotherapy Candidate Status: Candidate for Immunotherapy Intent of Therapy: Non-Curative / Palliative Intent, Discussed with Patient

## 2018-06-09 NOTE — Progress Notes (Signed)
[facility] [phone]   [phone]  OFFICE PROGRESS NOTE  Zachary Morning, DO [address] [place] [address]  DIAGNOSIS: Stage IV (T2 a, N3, M1c) non-small cell lung cancer, poorly differentiated adenocarcinoma presented with large left upper lobe lung mass with invasion of the chest wall as well as adjacent third and fourth rib in addition to bilateral hilar adenopathy, diffuse pulmonary metastases as well as diffuse osseous metastatic disease in addition to liver, adrenal and brain metastasis diagnosed in July 2019.  PRIOR THERAPY: None  CURRENT THERAPY: Systemic chemotherapy with carboplatin for AUC of 5, Alimta 500 mg/M2 and Keytruda 200 mg IV every 3 weeks.  First dose 06/15/2018.  INTERVAL HISTORY: Zachary Hobbs 77 y.o. male returns to the clinic today for follow-up visit accompanied by his wife and daughter.  The patient has more confusion today as well as increasing fatigue and weakness.  He also has shortness of breath with exertion.  He had several studies performed recently including a PET scan as well as MRI of the brain and biopsy of the left upper lobe chest wall mass.  The final pathology was consistent with poorly differentiated adenocarcinoma.  The patient was also found on MRI of the brain to have multiple brain metastasis.  His PET scan was consistent with extensive disease in the lung, bone, liver, and adrenal glands.  He denied having any nausea, vomiting, diarrhea or constipation.  He denied having any fever or chills.  He continues to have weight loss.  He is here for evaluation and discussion of his treatment options.   MEDICAL HISTORY: Past Medical History:  Diagnosis Date  . Hypertension     ALLERGIES:  is allergic to rosuvastatin.  MEDICATIONS:  Current Outpatient Medications  Medication Sig Dispense Refill  . aspirin EC 81 MG tablet Take 81 mg by mouth daily.    . meclizine (ANTIVERT) 50 MG tablet  Take 1 tablet (50 mg total) by mouth 3 (three) times daily as needed. (Patient taking differently: Take 50 mg by mouth 3 (three) times daily as needed for dizziness. ) 30 tablet 0  . omeprazole (PRILOSEC OTC) 20 MG tablet Take 20 mg by mouth daily.    . ondansetron (ZOFRAN) 8 MG tablet Take 1 tablet (8 mg total) by mouth every 8 (eight) hours as needed for nausea or vomiting. 30 tablet 3  . oxyCODONE-acetaminophen (PERCOCET/ROXICET) 5-325 MG tablet Take 1 tablet by mouth every 6 (six) hours as needed for severe pain. 30 tablet 0  . simvastatin (ZOCOR) 40 MG tablet Take 40 mg by mouth every evening.    . traMADol (ULTRAM) 50 MG tablet Take 25 mg by mouth 3 (three) times daily as needed.  0  . valsartan-hydrochlorothiazide (DIOVAN-HCT) 320-25 MG tablet Take 1 tablet by mouth daily.     No current facility-administered medications for this visit.     SURGICAL HISTORY:  Past Surgical History:  Procedure Laterality Date  . COLONOSCOPY      REVIEW OF SYSTEMS:  Constitutional: positive for anorexia, fatigue and weight loss Eyes: negative Ears, nose, mouth, throat, and face: negative Respiratory: positive for cough, dyspnea on exertion, hemoptysis and sputum Cardiovascular: negative Gastrointestinal: negative Genitourinary:negative Integument/breast: negative Hematologic/lymphatic: negative Musculoskeletal:positive for bone pain Neurological: negative Behavioral/Psych: negative Endocrine: negative Allergic/Immunologic: negative   PHYSICAL EXAMINATION: General appearance: alert, cooperative, fatigued and no distress Head: Normocephalic, without obvious abnormality, atraumatic Neck: no adenopathy, no JVD, supple, symmetrical, trachea midline and  thyroid not enlarged, symmetric, no tenderness/mass/nodules Lymph nodes: Cervical, supraclavicular, and axillary nodes normal. Resp: rhonchi bilaterally and wheezes bilaterally Back: symmetric, no curvature. ROM normal. No CVA tenderness. Cardio:  regular rate and rhythm, S1, S2 normal, no murmur, click, rub or gallop GI: soft, non-tender; bowel sounds normal; no masses,  no organomegaly Extremities: extremities normal, atraumatic, no cyanosis or edema Neurologic: Alert and oriented X 3, normal strength and tone. Normal symmetric reflexes. Normal coordination and gait  ECOG PERFORMANCE STATUS: 1 - Symptomatic but completely ambulatory  Blood pressure (!) 89/62, pulse 100, temperature 97.9 F (36.6 C), temperature source Oral, resp. rate 18, height 5' 8.75" (1.746 m), weight 137 lb 9.6 oz (62.4 kg), SpO2 98 %.  LABORATORY DATA: Lab Results  Component Value Date   WBC 11.0 (H) 06/02/2018   HGB 10.1 (L) 06/02/2018   HCT 32.8 (L) 06/02/2018   MCV 75.8 (L) 06/02/2018   PLT 433 (H) 06/02/2018      Chemistry      Component Value Date/Time   NA 141 05/18/2018 1156   K 4.8 05/18/2018 1156   CL 104 05/18/2018 1156   CO2 25 05/18/2018 1156   BUN 40 (H) 05/18/2018 1156   CREATININE 1.94 (H) 05/18/2018 1156      Component Value Date/Time   CALCIUM 9.6 05/18/2018 1156   ALKPHOS 113 05/18/2018 1156   AST 31 05/18/2018 1156   ALT 54 (H) 05/18/2018 1156   BILITOT 0.7 05/18/2018 1156       RADIOGRAPHIC STUDIES: Ct Soft Tissue Neck W Contrast  Result Date: 05/18/2018 CLINICAL DATA:  Weight loss. EXAM: CT NECK WITH CONTRAST TECHNIQUE: Multidetector CT imaging of the neck was performed using the standard protocol following the bolus administration of intravenous contrast. CONTRAST:  44mL ISOVUE-370 IOPAMIDOL (ISOVUE-370) INJECTION 76% COMPARISON:  CT chest, abdomen, pelvis reported separately. FINDINGS: Pharynx and larynx: Normal. No mass or swelling. Salivary glands: No inflammation, mass, or stone. Thyroid: Normal. Lymph nodes: None enlarged or abnormal density. Vascular: Atherosclerosis. Limited intracranial: Not assessed. Visualized orbits: Partially visualized, negative. Mastoids and visualized paranasal sinuses: Clear. Skeleton:  Cervical spondylosis. Periapical lucencies suggesting dental disease. Upper chest: Reported separately.  Multiple masses are identified. Other: None. IMPRESSION: No visible head/neck primary or malignant appearing cervical adenopathy. Carotid bifurcation atherosclerosis. Cervical spondylosis, and dental disease. Electronically Signed   By: Staci Righter M.D.   On: 05/18/2018 13:28   Ct Angio Chest Pe W And/or Wo Contrast  Result Date: 05/18/2018 CLINICAL DATA:  Significant weight loss. Smoking history. Abnormal chest radiograph EXAM: CT ANGIOGRAPHY CHEST CT ABDOMEN AND PELVIS WITH CONTRAST TECHNIQUE: Multidetector CT imaging of the chest was performed using the standard protocol during bolus administration of intravenous contrast. Multiplanar CT image reconstructions and MIPs were obtained to evaluate the vascular anatomy. Multidetector CT imaging of the abdomen and pelvis was performed using the standard protocol during bolus administration of intravenous contrast. CONTRAST:  77mL ISOVUE-370 IOPAMIDOL (ISOVUE-370) INJECTION 76% COMPARISON:  Chest radiograph 05/18/2018 FINDINGS: CTA CHEST FINDINGS Cardiovascular: No filling defects within the pulmonary arteries to suggest acute pulmonary embolism. No significant vascular findings. Normal heart size. No pericardial effusion. Mediastinum/Nodes: No axillary or supraclavicular adenopathy. Bilateral hilar nodal enlargement with 1.5 cm hilar nodes. Esophagus normal. Lungs/Pleura: Innumerable bilateral pulmonary nodules of varying size ranging from 5 mm to 20 mm. Approximately 15 nodules per lung. There is a pleural base mass in the LEFT upper lobe laterally measuring 53 x 24 mm. There is osseous invasion of adjacent LEFT third  fourth ribs anteriorly (image 44/8 adjacent to this pleural base mass per Musculoskeletal: Chest wall invasion in the anterior LEFT chest involving the anterior aspect of the third and fourth ribs with mottled appearance of the ribs and  pathologic fracture medially in the third rib. This is just superior to the pleural-based LEFT upper lobe mass. Review of the MIP images confirms the above findings. CT ABDOMEN and PELVIS FINDINGS Hepatobiliary: Dilute density well-circumscribed ovoid lesion in the superior aspect of the LEFT hepatic lobe (segment 4A) measures 3.2 x 2.4 cm. Tiny 2 mm lesion inferior RIGHT hepatic lobe (image 30/4 normal gallbladder. Pancreas: Pancreas is normal. No ductal dilatation. No pancreatic inflammation. Spleen: Normal spleen Adrenals/urinary tract: There is nodule enlargement of the LEFT adrenal gland to 19 x 25 mm mild thickening of the RIGHT adrenal gland without discrete nodularity kidneys normal. Ureters and bladder normal Stomach/Bowel: Stomach, small-bowel cecum normal. Ascending, transverse, and descending colon normal. Rectosigmoid colon normal. No obstruction or mass. Vascular/Lymphatic: Abdominal aorta is normal caliber with atherosclerotic calcification. There is no retroperitoneal or periportal lymphadenopathy. No pelvic lymphadenopathy. Reproductive: Prostate normal Other: No peritoneal metastasis or omental thickening Musculoskeletal: No aggressive osseous lesion. Review of the MIP images confirms the above findings. IMPRESSION: Chest Impression: 1. No acute pulmonary embolism. 2. Bilateral pulmonary metastasis with innumerable nodules in both lungs. 3. Pleural base mass in the LEFT upper lobe could represent a metastatic deposit or potential primary lesion. 4. LEFT chest wall invasion adjacent to pleural base mass involving the third and fourth ribs anteriorly. Abdomen / Pelvis Impression: 1. Solitary low-density lesion in the LEFT hepatic lobe is concerning for hepatic metastasis. 2. LEFT adrenal gland metastasis. 3. No evidence of colorectal primary. This pattern of metastatic disease is suggestive of melanoma or small cell lung carcinoma. Recommend tissue sampling Electronically Signed   By: Suzy Bouchard M.D.   On: 05/18/2018 13:50   Mr Brain Wo Contrast  Result Date: 06/07/2018 CLINICAL DATA:  New diagnosis lung cancer. Patient not able to tolerate complete imaging including contrast administration. EXAM: MRI HEAD WITHOUT CONTRAST TECHNIQUE: Multiplanar, multiecho pulse sequences of the brain and surrounding structures were obtained without intravenous contrast. COMPARISON:  05/29/2018.  CT 02/12/2012. FINDINGS: Brain: The patient was not able to tolerate complete imaging. No metastatic lesion is suspected in the cerebellum. There is a large venous angioma on the right seen previously, not clinically significant. With respect to the cerebral hemispheres, there is a 2 cm mass in the right temporal lobe consistent with a metastasis. Question the presence of a 7 mm metastasis at a right posterior frontal gyrus at the vertex. In the left hemisphere, there is a 7-8 mm metastasis in the region of the corpus callosum or subependymal brain, bulging into the body of the left lateral ventricle. There is an 8 mm metastasis at the left parietal vertex. There is some associated edema, particularly related to the right temporal metastasis. No mass effect or shift however. No sign of hemorrhage. No sign of acute or subacute infarction. Ordinary chronic small-vessel ischemic changes affect the white matter. No sign of obstructive hydrocephalus or extra-axial collection. Vascular: Major vessels at the base of the brain show flow. Skull and upper cervical spine: No skull base or calvarial metastasis is identified. Question abnormal C3 vertebral body. Sinuses/Orbits: Clear/normal Other: None IMPRESSION: Abbreviated exam. At least 4 metastases identified. 2 cm right temporal lobe metastasis with some surrounding edema. Question 7 mm metastasis at the right posterior frontal vertex. 7-8 mm metastasis  of the left corpus callosum or subependymal brain. 8 mm metastasis at the left parietal vertex. Question abnormal C3 vertebral  body. Electronically Signed   By: Nelson Chimes M.D.   On: 06/07/2018 10:25   Mr Brain Wo Contrast  Result Date: 05/30/2018 CLINICAL DATA:  Lung cancer staging. EXAM: MRI HEAD WITHOUT CONTRAST TECHNIQUE: Sagittal T1 sequence. COMPARISON:  CT HEAD January 26, 2011 FINDINGS: Technologist report patient was in too much pain to continue examination and study was terminated. Brain: Limited assessment. No significant mass effect. No hydrocephalus. No cerebellar tonsillar ectopia. No abnormal intrinsic T1 shortening. Vascular: Nondiagnostic. Skull and upper cervical spine: No suspicious calvarial bone marrow signal. Sinuses/Orbits: Nondiagnostic. Other: None. IMPRESSION: Limited sagittal T1 sequence. Nondiagnostic assessment for metastasis. Consider repeat examination with appropriate medication. Electronically Signed   By: Elon Alas M.D.   On: 05/30/2018 02:54   Ct Abdomen Pelvis W Contrast  Result Date: 05/18/2018 CLINICAL DATA:  Significant weight loss. Smoking history. Abnormal chest radiograph EXAM: CT ANGIOGRAPHY CHEST CT ABDOMEN AND PELVIS WITH CONTRAST TECHNIQUE: Multidetector CT imaging of the chest was performed using the standard protocol during bolus administration of intravenous contrast. Multiplanar CT image reconstructions and MIPs were obtained to evaluate the vascular anatomy. Multidetector CT imaging of the abdomen and pelvis was performed using the standard protocol during bolus administration of intravenous contrast. CONTRAST:  54mL ISOVUE-370 IOPAMIDOL (ISOVUE-370) INJECTION 76% COMPARISON:  Chest radiograph 05/18/2018 FINDINGS: CTA CHEST FINDINGS Cardiovascular: No filling defects within the pulmonary arteries to suggest acute pulmonary embolism. No significant vascular findings. Normal heart size. No pericardial effusion. Mediastinum/Nodes: No axillary or supraclavicular adenopathy. Bilateral hilar nodal enlargement with 1.5 cm hilar nodes. Esophagus normal. Lungs/Pleura: Innumerable  bilateral pulmonary nodules of varying size ranging from 5 mm to 20 mm. Approximately 15 nodules per lung. There is a pleural base mass in the LEFT upper lobe laterally measuring 53 x 24 mm. There is osseous invasion of adjacent LEFT third fourth ribs anteriorly (image 44/8 adjacent to this pleural base mass per Musculoskeletal: Chest wall invasion in the anterior LEFT chest involving the anterior aspect of the third and fourth ribs with mottled appearance of the ribs and pathologic fracture medially in the third rib. This is just superior to the pleural-based LEFT upper lobe mass. Review of the MIP images confirms the above findings. CT ABDOMEN and PELVIS FINDINGS Hepatobiliary: Dilute density well-circumscribed ovoid lesion in the superior aspect of the LEFT hepatic lobe (segment 4A) measures 3.2 x 2.4 cm. Tiny 2 mm lesion inferior RIGHT hepatic lobe (image 30/4 normal gallbladder. Pancreas: Pancreas is normal. No ductal dilatation. No pancreatic inflammation. Spleen: Normal spleen Adrenals/urinary tract: There is nodule enlargement of the LEFT adrenal gland to 19 x 25 mm mild thickening of the RIGHT adrenal gland without discrete nodularity kidneys normal. Ureters and bladder normal Stomach/Bowel: Stomach, small-bowel cecum normal. Ascending, transverse, and descending colon normal. Rectosigmoid colon normal. No obstruction or mass. Vascular/Lymphatic: Abdominal aorta is normal caliber with atherosclerotic calcification. There is no retroperitoneal or periportal lymphadenopathy. No pelvic lymphadenopathy. Reproductive: Prostate normal Other: No peritoneal metastasis or omental thickening Musculoskeletal: No aggressive osseous lesion. Review of the MIP images confirms the above findings. IMPRESSION: Chest Impression: 1. No acute pulmonary embolism. 2. Bilateral pulmonary metastasis with innumerable nodules in both lungs. 3. Pleural base mass in the LEFT upper lobe could represent a metastatic deposit or potential  primary lesion. 4. LEFT chest wall invasion adjacent to pleural base mass involving the third and fourth ribs anteriorly. Abdomen /  Pelvis Impression: 1. Solitary low-density lesion in the LEFT hepatic lobe is concerning for hepatic metastasis. 2. LEFT adrenal gland metastasis. 3. No evidence of colorectal primary. This pattern of metastatic disease is suggestive of melanoma or small cell lung carcinoma. Recommend tissue sampling Electronically Signed   By: Suzy Bouchard M.D.   On: 05/18/2018 13:50   Nm Pet Image Initial (pi) Skull Base To Thigh  Result Date: 05/29/2018 CLINICAL DATA:  Initial treatment strategy for non-small cell lung cancer. EXAM: NUCLEAR MEDICINE PET SKULL BASE TO THIGH TECHNIQUE: 7.97 mCi F-18 FDG was injected intravenously. Full-ring PET imaging was performed from the skull base to thigh after the radiotracer. CT data was obtained and used for attenuation correction and anatomic localization. Fasting blood glucose: 106 mg/dl COMPARISON:  CT scans 05/18/2018 FINDINGS: Mediastinal blood pool activity: SUV max 2.60 NECK: No hypermetabolic lymph nodes in the neck. Incidental CT findings: none CHEST: The 5 x 3 cm peripheral left upper lobe lung mass versus pleural mass is markedly hypermetabolic with SUV max of 84.69. Findings consistent with associated chest wall invasion involving and partially destroying the third and fourth left anterior ribs with pathologic fractures. Diffuse pulmonary metastatic disease with innumerable hypermetabolic pulmonary lesions throughout both lungs. 2.6 cm index lesion at the left lung base has an SUV max of 21.37. Index lesion in the right lower lobe on image number 42 measures 18 mm and SUV max is 16.46. Right hilar adenopathy is hypermetabolic with SUV max of 62.95. Left hilar adenopathy is hypermetabolic with SUV max of 28.41. 12.5 mm left subpectoral lymph node on image 56 has an SUV max of 16.08. Incidental CT findings: none ABDOMEN/PELVIS: 3 cm segment 2  liver lesion is hypermetabolic with SUV max of 32.44. Bilateral adrenal gland metastasis. The 2.2 cm left adrenal gland lesion has an SUV max of 23.29 and the 15 mm right adrenal gland lesion has an SUV max of 12.62. No metastatic lymphadenopathy. Incidental CT findings: none SKELETON: Diffuse osseous metastatic disease involving the axial and appendicular skeleton. No spinal canal compromise is identified. No pathologic fractures other than the left third and fourth anterior ribs but those are involved with direct invasion of tumor. Incidental CT findings: none IMPRESSION: 1. Large hypermetabolic peripheral left lung mass invading the chest wall and adjacent third and fourth anterior ribs consistent with known neoplasm. 2. Diffuse pulmonary metastatic disease. 3. Bilateral hilar adenopathy. 4. Diffuse osseous metastatic disease. 5. Single metastatic focus in the left hepatic lobe and bilateral adrenal gland metastasis. Electronically Signed   By: Marijo Sanes M.D.   On: 05/29/2018 17:06   Ct Biopsy  Result Date: 06/02/2018 INDICATION: 77 year old male with extensive multifocal metastatic disease which may be widely metastatic lung cancer. The dominant mass is pleural based in the periphery of the left upper lobe. EXAM: CT-guided biopsy left lobe pulmonary nodule Interventional Radiologist:  Criselda Peaches, MD MEDICATIONS: None. ANESTHESIA/SEDATION: Fentanyl 100 mcg IV; Versed 2 mg IV Moderate Sedation Time:  11 minutes The patient was continuously monitored during the procedure by the interventional radiology nurse under my direct supervision. FLUOROSCOPY TIME:  Fluoroscopy Time: 0 minutes 0 seconds (0 mGy). COMPLICATIONS: None immediate. Estimated blood loss:  0 PROCEDURE: Informed written consent was obtained from the patient after a thorough discussion of the procedural risks, benefits and alternatives. All questions were addressed. Maximal Sterile Barrier Technique was utilized including caps, mask,  sterile gowns, sterile gloves, sterile drape, hand hygiene and skin antiseptic. A timeout was performed prior to  the initiation of the procedure. A planning axial CT scan was performed. The pleural base mass in the periphery of the left upper lobe was successfully identified. A suitable skin entry site was selected and marked. The region was then sterilely prepped and draped in standard fashion using Betadine skin prep. Local anesthesia was attained by infiltration with 1% lidocaine. A small dermatotomy was made. Under intermittent CT fluoroscopic guidance, a 17 gauge trocar needle was advanced into the lung and positioned at the margin of the nodule. Multiple 18 gauge core biopsies were then coaxially obtained using the BioPince automated biopsy device. Biopsy specimens were placed in formalin and delivered to pathology for further analysis. The biopsy device and introducer needle were removed. Post biopsy axial CT imaging demonstrates no evidence of immediate complication. There is no pneumothorax. Mild perilesional alveolar hemorrhage is not unexpected. The patient tolerated the procedure well. IMPRESSION: Technically successful CT-guided biopsy of pleural based left upper lobe pulmonary mass. Electronically Signed   By: Jacqulynn Cadet M.D.   On: 06/02/2018 13:33   Dg Chest Port 1 View  Result Date: 06/02/2018 CLINICAL DATA:  77 year old male status post CT-guided left lung biopsy at 1230 hours today. EXAM: PORTABLE CHEST 1 VIEW COMPARISON:  CT biopsy images today.  PET-CT 05/29/2018. FINDINGS: Portable AP semi upright view at 1317 hours. The biopsied left lateral lung rounded mass appears stable in size and configuration. No pneumothorax or pleural effusion. Numerous additional bilateral pulmonary nodules redemonstrated. Stable cardiac size and mediastinal contours. Visualized tracheal air column is within normal limits. No new pulmonary opacity. Negative visible bowel gas pattern. Stable visualized  osseous structures. IMPRESSION: No pneumothorax or new abnormality status post left lung CT-guided biopsy. Electronically Signed   By: Genevie Ann M.D.   On: 06/02/2018 13:28   Dg Chest Portable 1 View  Result Date: 05/18/2018 CLINICAL DATA:  Chest pain. EXAM: PORTABLE CHEST 1 VIEW COMPARISON:  02/13/2012. FINDINGS: Mediastinum and hilar structures are normal. Cardiomegaly with normal pulmonary vascularity. Multiple bilateral pulmonary nodules are noted. These are worrisome for metastatic lesions. Large pleural-based densities are noted along the left upper pleural wall. These could also be metastatic. These findings can also be caused by infectious etiologies. Chest CT suggested for further evaluation. No pneumothorax. No acute bony abnormality. IMPRESSION: 1.  Multiple pulmonary nodules worrisome for metastatic disease. 2. Large pleural-based densities noted along the left upper pleural border. These could also be secondary to metastatic disease. IV contrast-enhanced chest CT suggested for further evaluation. Electronically Signed   By: Marcello Moores  Register   On: 05/18/2018 13:18    ASSESSMENT AND PLAN: This is a very pleasant 77 years old African-American male with recently diagnosed stage IV non-small cell lung cancer, poorly differentiated adenocarcinoma presented with large left upper lobe chest wall mass in addition to bilateral hilar lymphadenopathy, multiple bilateral pulmonary nodules, liver, adrenal and brain metastasis diagnosed in July 2018. I had a lengthy discussion with the patient and his family today about his current condition and treatment options. I requested the tissue block to be sent to [facility] for molecular studies and PDL 1 expression. I also gave the patient the option of palliative care and hospice referral versus treatment with palliative systemic chemotherapy with carboplatin for AUC of 5, Alimta 500 mg/M2 and Keytruda 200 mg IV every 3 weeks. I discussed with the patient the  adverse effect of the chemotherapy including but not limited to alopecia, myelosuppression, nausea and vomiting, peripheral neuropathy, liver or renal dysfunction as well  as the immunotherapy adverse effects.  The patient and his family would like to proceed with systemic chemotherapy and he is expected to start the first dose of this treatment next week. The patient will receive vitamin B12 injection today. I will send the prescription to his pharmacy for Compazine 10 mg p.o. every 6 hours as needed for nausea, and folic acid 1 mg p.o. Daily. For the brain lesion and the metastatic disease in the left chest wall, the patient is currently seen by radiation oncology for palliative radiotherapy. I will see him back for follow-up visit in 4 weeks for evaluation with the start of cycle #2 of his treatment. The patient will have a chemotherapy education class before the first dose of his treatment. He was advised to call immediately if he has any concerning symptoms in the interval. The patient voices understanding of current disease status and treatment options and is in agreement with the current care plan. All questions were answered. The patient knows to call the clinic with any problems, questions or concerns. We can certainly see the patient much sooner if necessary.  I spent 15 minutes counseling the patient face to face. The total time spent in the appointment was 25 minutes.  Disclaimer: This note was dictated with voice recognition software. Similar sounding words can inadvertently be transcribed and may not be corrected upon review.

## 2018-06-10 ENCOUNTER — Other Ambulatory Visit: Payer: Self-pay | Admitting: Internal Medicine

## 2018-06-10 NOTE — Progress Notes (Signed)
DISCONTINUE ON PATHWAY REGIMEN - Non-Small Cell Lung     A cycle is every 21 days:     Pembrolizumab      Pemetrexed      Carboplatin   **Always confirm dose/schedule in your pharmacy ordering system**  REASON: Other Reason PRIOR TREATMENT: [id number]: Pembrolizumab 200 mg + Pemetrexed 500 mg/m2 + Carboplatin AUC=5 q21 Days x 4-6 Cycles TREATMENT RESPONSE: Unable to Evaluate  START ON PATHWAY REGIMEN - Non-Small Cell Lung     A cycle is every 21 days:     Atezolizumab      Bevacizumab-xxxx      Paclitaxel      Carboplatin   **Always confirm dose/schedule in your pharmacy ordering system**  Patient Characteristics: Stage IV Metastatic, Nonsquamous, Initial Chemotherapy/Immunotherapy, PS = 0, 1, ALK Translocation Negative/Unknown and EGFR Mutation Negative/Non-Sensitizing/Unknown, PD-L1 Expression Positive 1-49% (TPS) / Negative / Not Tested / Awaiting Test Results  and Immunotherapy Candidate AJCC T Category: T2a Current Disease Status: Distant Metastases AJCC N Category: N3 AJCC M Category: M1c AJCC 8 Stage Grouping: IVB Histology: Nonsquamous Cell ROS1 Rearrangement Status: Awaiting Test Results T790M Mutation Status: Not Applicable - EGFR Mutation Negative/Unknown Other Mutations/Biomarkers: No Other Actionable Mutations NTRK Gene Fusion Status: Awaiting Test Results PD-L1 Expression Status: Awaiting Test Results Chemotherapy/Immunotherapy LOT: Initial Chemotherapy/Immunotherapy Molecular Targeted Therapy: Not Appropriate ALK Translocation Status: Awaiting Test Results EGFR Mutation Status: Awaiting Test Results BRAF V600E Mutation Status: Awaiting Test Results Performance Status: PS = 0, 1 Immunotherapy Candidate Status: Candidate for Immunotherapy Intent of Therapy: Non-Curative / Palliative Intent, Discussed with Patient

## 2018-06-10 NOTE — Progress Notes (Signed)
CHART NOTE Chemotherapy plan was to change it from carboplatin, pemetrexed and Ketruda (pembrolizumab) to carboplatin, paclitaxel and Keytruda because of the significant renal insufficiency the patient will not be candidate for treatment with pemetrexed.

## 2018-06-12 ENCOUNTER — Ambulatory Visit
Admission: RE | Admit: 2018-06-12 | Discharge: 2018-06-12 | Disposition: A | Discharge status: Home / Self Care | Payer: 59 | Source: Ambulatory Visit | Attending: Radiation Oncology | Admitting: Radiation Oncology

## 2018-06-12 ENCOUNTER — Inpatient Hospital Stay: Payer: 59

## 2018-06-12 DIAGNOSIS — C7951 Secondary malignant neoplasm of bone: Secondary | ICD-10-CM | POA: Diagnosis not present

## 2018-06-13 ENCOUNTER — Ambulatory Visit
Admission: RE | Admit: 2018-06-13 | Discharge: 2018-06-13 | Disposition: A | Discharge status: Home / Self Care | Payer: 59 | Source: Ambulatory Visit | Attending: Radiation Oncology | Admitting: Radiation Oncology

## 2018-06-13 ENCOUNTER — Inpatient Hospital Stay: Payer: 59

## 2018-06-13 NOTE — Progress Notes (Signed)
Brain and Spine Tumor Board Documentation  Zachary Hobbs was presented by Cecil Cobbs, MD at Brain and Spine Tumor Board on 06/13/2018, which included representatives from medical oncology, neuro oncology, radiation oncology, surgical oncology, radiology, pathology, navigation, genetics.  Zachary Hobbs was presented as a new patient with history of the following treatments: none.  Additionally, we reviewed previous medical and familial history, history of present illness, and recent lab results along with all available histopathologic and imaging studies. The tumor board considered available treatment options and made the following recommendations:  radiation therapy (primary modality)  Will defer SRS in near term until chemotherapy initiated and full CNS imaging (contrast enhanced MRI) is obtained  Tumor board is a meeting of clinicians from various specialty areas who evaluate and discuss patients for whom a multidisciplinary approach is being considered. Final determinations in the plan of care are those of the provider(s). The responsibility for follow up of recommendations given during tumor board is that of the provider.   Today's extended care, comprehensive team conference, Zachary Hobbs was not present for the discussion and was not examined.

## 2018-06-13 NOTE — Progress Notes (Signed)
Nutrition Assessment   Reason for Assessment:   Referral from Shona Simpson for 50 lb weight loss, poor oral intake, dehydration   ASSESSMENT:   77 year old male with stage IV non-small cell lung cancer with mets to liver, adrenal and brain.  Past medical history of HTN.  Met with patient, wife and daughter in clinic prior to radiation this pm.  Patient in wheelchair, complains that back is hurting.  Keeps head down and eyes closed during much of visit.  Says only a few words.  Most of information obtained from wife.  Wife reports decreased appetite for the last month. Reports patient only eating bites or few spoonfuls at most.  Wife is encouraging him to eat.  Reports he tells her that he is not hungry.  Patient reports that he has dry mouth (has biotene but will only do it 1 time per day per wife).  Also reports food taste like garbage.  Denies nausea.     Nutrition Focused Physical Exam: Nutrition-Focused physical exam completed. Findings are moderate orbita , moderate buccal, severe triceps, severe ribs fat depletion, mild temple, severe clavicle, severe shoulder, severe scapula, mild hand, moderate calf, unable to evaluate lower extremities due to wearing pants muscle depletion, and unable to determine edema.    Medications: prilosec, zofran, compazine, folic acid   Labs: reviewed from 8/9   Anthropometrics:    Height: 68.75 inches Weight: 137 lb 9.6 oz on 8/9 UBW: 180-185 lb per family (noted that weight documented in 2013). Family reports last at that weight in June of 2019 BMI: 20  24% weight loss in the last 2 months, significant per family  Estimated Energy Needs  Kcals: 1860-2170 calories/d Protein: 93-109 g/d Fluid: 2.1 L/d   NUTRITION DIAGNOSIS: Malnutrition related to cancer and cancer related treatment side effects as evidenced by 23% weight loss in 2 months, severe muscle mass and fat depletion   MALNUTRITION DIAGNOSIS: Patient meets criteria for severe  malnutrition in context of acute illness likely progressing to chronic as evidenced by 23% weight loss in 2 months, severe muscle mass and fat depletion.    INTERVENTION:  Discussed strategies to help with dry mouth. Fact sheet given Discussed strategies to help with taste change. Fact sheet given. Encouraged oral nutrition supplements at least 2-3 per day.  1st case of ensure enlive given today. Recommend trial of appetite stimulant.     If aggressive nutrition therapy is wanted would recommend feeding tube placement as patient currently not meeting nutritional needs, severe muscle mass loss and fat depletion, and significant weight loss. Communication sent to Shona Simpson, NP regarding recommendations   MONITORING, EVALUATION, GOAL: Patient will maximize calories and protein to meet nutritional needs.    Next Visit: Thursday, Sept 4 during infusion  Zachary Hobbs, [place], [place] Registered Dietitian [phone] (pager)

## 2018-06-14 ENCOUNTER — Other Ambulatory Visit: Payer: Self-pay | Admitting: Radiation Therapy

## 2018-06-14 ENCOUNTER — Encounter (HOSPITAL_COMMUNITY): Payer: Self-pay | Admitting: Internal Medicine

## 2018-06-14 ENCOUNTER — Telehealth: Payer: Self-pay | Admitting: Internal Medicine

## 2018-06-14 ENCOUNTER — Ambulatory Visit
Admission: RE | Admit: 2018-06-14 | Discharge: 2018-06-14 | Disposition: A | Discharge status: Home / Self Care | Payer: 59 | Source: Ambulatory Visit | Attending: Radiation Oncology | Admitting: Radiation Oncology

## 2018-06-14 DIAGNOSIS — C7949 Secondary malignant neoplasm of other parts of nervous system: Principal | ICD-10-CM

## 2018-06-14 DIAGNOSIS — C7931 Secondary malignant neoplasm of brain: Secondary | ICD-10-CM

## 2018-06-14 NOTE — Telephone Encounter (Signed)
Appointments completed and patient has been notified per 8/9 los

## 2018-06-15 ENCOUNTER — Other Ambulatory Visit: Payer: Self-pay | Admitting: Internal Medicine

## 2018-06-15 ENCOUNTER — Inpatient Hospital Stay: Payer: 59

## 2018-06-15 ENCOUNTER — Telehealth: Payer: Self-pay | Admitting: Internal Medicine

## 2018-06-15 ENCOUNTER — Ambulatory Visit
Admission: RE | Admit: 2018-06-15 | Discharge: 2018-06-15 | Disposition: A | Discharge status: Home / Self Care | Payer: 59 | Source: Ambulatory Visit | Attending: Radiation Oncology | Admitting: Radiation Oncology

## 2018-06-15 VITALS — BP 110/54 | HR 62 | Temp 97.7°F | Resp 18

## 2018-06-15 DIAGNOSIS — C3492 Malignant neoplasm of unspecified part of left bronchus or lung: Secondary | ICD-10-CM | POA: Diagnosis not present

## 2018-06-15 DIAGNOSIS — C3412 Malignant neoplasm of upper lobe, left bronchus or lung: Secondary | ICD-10-CM

## 2018-06-15 DIAGNOSIS — I959 Hypotension, unspecified: Secondary | ICD-10-CM

## 2018-06-15 DIAGNOSIS — R911 Solitary pulmonary nodule: Secondary | ICD-10-CM

## 2018-06-15 LAB — CBC WITH DIFFERENTIAL (CANCER CENTER ONLY)
BASOS ABS: 0 10*3/uL (ref 0.0–0.1)
BASOS PCT: 0 %
Eosinophils Absolute: 0.1 10*3/uL (ref 0.0–0.5)
Eosinophils Relative: 1 %
HCT: 30.7 % — ABNORMAL LOW (ref 38.4–49.9)
HEMOGLOBIN: 9.8 g/dL — AB (ref 13.0–17.1)
Lymphocytes Relative: 9 %
Lymphs Abs: 0.8 10*3/uL — ABNORMAL LOW (ref 0.9–3.3)
MCH: 23.6 pg — ABNORMAL LOW (ref 27.2–33.4)
MCHC: 31.9 g/dL — AB (ref 32.0–36.0)
MCV: 74 fL — ABNORMAL LOW (ref 79.3–98.0)
MONOS PCT: 9 %
Monocytes Absolute: 0.9 10*3/uL (ref 0.1–0.9)
NEUTROS PCT: 81 %
Neutro Abs: 7.8 10*3/uL — ABNORMAL HIGH (ref 1.5–6.5)
Platelet Count: 400 10*3/uL (ref 140–400)
RBC: 4.15 MIL/uL — ABNORMAL LOW (ref 4.20–5.82)
RDW: 15.8 % — ABNORMAL HIGH (ref 11.0–14.6)
WBC Count: 9.7 10*3/uL (ref 4.0–10.3)

## 2018-06-15 LAB — CMP (CANCER CENTER ONLY)
ALT: 57 U/L — AB (ref 0–44)
AST: 26 U/L (ref 15–41)
Albumin: 2.3 g/dL — ABNORMAL LOW (ref 3.5–5.0)
Alkaline Phosphatase: 198 U/L — ABNORMAL HIGH (ref 38–126)
Anion gap: 17 — ABNORMAL HIGH (ref 5–15)
BILIRUBIN TOTAL: 1 mg/dL (ref 0.3–1.2)
BUN: 110 mg/dL — ABNORMAL HIGH (ref 8–23)
CALCIUM: 11.3 mg/dL — AB (ref 8.9–10.3)
CO2: 19 mmol/L — AB (ref 22–32)
CREATININE: 2.25 mg/dL — AB (ref 0.61–1.24)
Chloride: 103 mmol/L (ref 98–111)
GFR, EST AFRICAN AMERICAN: 31 mL/min — AB (ref >60)
GFR, Estimated: 27 mL/min — ABNORMAL LOW (ref >60)
Glucose, Bld: 94 mg/dL (ref 70–99)
Potassium: 5.2 mmol/L — ABNORMAL HIGH (ref 3.5–5.1)
Sodium: 139 mmol/L (ref 135–145)
TOTAL PROTEIN: 8.2 g/dL — AB (ref 6.5–8.1)

## 2018-06-15 MED ORDER — DIPHENHYDRAMINE HCL 50 MG/ML IJ SOLN
INTRAMUSCULAR | Status: AC
  Filled 2018-06-15: qty 1

## 2018-06-15 MED ORDER — SODIUM CHLORIDE 0.9 % IV SOLN
200.0000 mg | Freq: Once | INTRAVENOUS | Status: AC
  Administered 2018-06-15: 200 mg via INTRAVENOUS
  Filled 2018-06-15: qty 8

## 2018-06-15 MED ORDER — ZOLEDRONIC ACID 4 MG/100ML IV SOLN
4.0000 mg | Freq: Once | INTRAVENOUS | Status: AC
  Administered 2018-06-15: 4 mg via INTRAVENOUS
  Filled 2018-06-15: qty 100

## 2018-06-15 MED ORDER — PALONOSETRON HCL INJECTION 0.25 MG/5ML
INTRAVENOUS | Status: AC
  Filled 2018-06-15: qty 5

## 2018-06-15 MED ORDER — SODIUM CHLORIDE 0.9 % IV SOLN: Freq: Once | INTRAVENOUS | Status: DC

## 2018-06-15 MED ORDER — SODIUM CHLORIDE 0.9 % IV SOLN
Freq: Once | INTRAVENOUS | Status: AC
  Administered 2018-06-15: 15:00:00 via INTRAVENOUS
  Filled 2018-06-15: qty 250

## 2018-06-15 MED ORDER — FAMOTIDINE IN NACL 20-0.9 MG/50ML-% IV SOLN
INTRAVENOUS | Status: AC
  Filled 2018-06-15: qty 50

## 2018-06-15 MED ORDER — PALONOSETRON HCL INJECTION 0.25 MG/5ML: 0.2500 mg | Freq: Once | INTRAVENOUS | Status: DC

## 2018-06-15 MED ORDER — OXYCODONE-ACETAMINOPHEN 5-325 MG PO TABS
1.0000 | ORAL_TABLET | Freq: Once | ORAL | Status: DC
Start: 2018-06-15 — End: 2018-06-15

## 2018-06-15 MED ORDER — FAMOTIDINE IN NACL 20-0.9 MG/50ML-% IV SOLN: 20.0000 mg | Freq: Once | INTRAVENOUS | Status: DC

## 2018-06-15 MED ORDER — SODIUM CHLORIDE 0.9 % IV SOLN: 200.0000 mg/m2 | Freq: Once | INTRAVENOUS | Status: DC

## 2018-06-15 MED ORDER — DIPHENHYDRAMINE HCL 50 MG/ML IJ SOLN
50.0000 mg | Freq: Once | INTRAMUSCULAR | Status: DC
Start: 2018-06-15 — End: 2018-06-15

## 2018-06-15 MED ORDER — SODIUM CHLORIDE 0.9 % IV SOLN: 242.0000 mg | Freq: Once | INTRAVENOUS | Status: DC

## 2018-06-15 NOTE — Patient Instructions (Addendum)
[facility] Discharge Instructions for Patients Receiving Chemotherapy  Today you received the following chemotherapy agents Keytruda  To help prevent nausea and vomiting after your treatment, we encourage you to take your nausea medication as directed  If you develop nausea and vomiting that is not controlled by your nausea medication, call the clinic.   BELOW ARE SYMPTOMS THAT SHOULD BE REPORTED IMMEDIATELY:  *FEVER GREATER THAN 100.5 F  *CHILLS WITH OR WITHOUT FEVER  NAUSEA AND VOMITING THAT IS NOT CONTROLLED WITH YOUR NAUSEA MEDICATION  *UNUSUAL SHORTNESS OF BREATH  *UNUSUAL BRUISING OR BLEEDING  TENDERNESS IN MOUTH AND THROAT WITH OR WITHOUT PRESENCE OF ULCERS  *URINARY PROBLEMS  *BOWEL PROBLEMS  UNUSUAL RASH Items with * indicate a potential emergency and should be followed up as soon as possible.  Feel free to call the clinic should you have any questions or concerns. The clinic phone number is [phone].  Please show the [place] at check-in to the Emergency Department and triage nurse.    Pembrolizumab injection What is this medicine? PEMBROLIZUMAB (pem broe liz ue mab) is a monoclonal antibody. It is used to treat melanoma, head and neck cancer, Hodgkin lymphoma, non-small cell lung cancer, urothelial cancer, stomach cancer, and cancers that have a certain genetic condition. This medicine may be used for other purposes; ask your health care provider or pharmacist if you have questions. COMMON BRAND NAME(S): Keytruda What should I tell my health care provider before I take this medicine? They need to know if you have any of these conditions: -diabetes -immune system problems -inflammatory bowel disease -liver disease -lung or breathing disease -lupus -organ transplant -an unusual or allergic reaction to pembrolizumab, other medicines, foods, dyes, or preservatives -pregnant or trying to get pregnant -breast-feeding How should I  use this medicine? This medicine is for infusion into a vein. It is given by a health care professional in a hospital or clinic setting. A special MedGuide will be given to you before each treatment. Be sure to read this information carefully each time. Talk to your pediatrician regarding the use of this medicine in children. While this drug may be prescribed for selected conditions, precautions do apply. Overdosage: If you think you have taken too much of this medicine contact a poison control center or emergency room at once. NOTE: This medicine is only for you. Do not share this medicine with others. What if I miss a dose? It is important not to miss your dose. Call your doctor or health care professional if you are unable to keep an appointment. What may interact with this medicine? Interactions have not been studied. Give your health care provider a list of all the medicines, herbs, non-prescription drugs, or dietary supplements you use. Also tell them if you smoke, drink alcohol, or use illegal drugs. Some items may interact with your medicine. This list may not describe all possible interactions. Give your health care provider a list of all the medicines, herbs, non-prescription drugs, or dietary supplements you use. Also tell them if you smoke, drink alcohol, or use illegal drugs. Some items may interact with your medicine. What should I watch for while using this medicine? Your condition will be monitored carefully while you are receiving this medicine. You may need blood work done while you are taking this medicine. Do not become pregnant while taking this medicine or for 4 months after stopping it. Women should inform their doctor if they wish to become pregnant or think they  might be pregnant. There is a potential for serious side effects to an unborn child. Talk to your health care professional or pharmacist for more information. Do not breast-feed an infant while taking this medicine or  for 4 months after the last dose. What side effects may I notice from receiving this medicine? Side effects that you should report to your doctor or health care professional as soon as possible: -allergic reactions like skin rash, itching or hives, swelling of the face, lips, or tongue -bloody or black, tarry -breathing problems -changes in vision -chest pain -chills -constipation -cough -dizziness or feeling faint or lightheaded -fast or irregular heartbeat -fever -flushing -hair loss -low blood counts - this medicine may decrease the number of white blood cells, red blood cells and platelets. You may be at increased risk for infections and bleeding. -muscle pain -muscle weakness -persistent headache -signs and symptoms of high blood sugar such as dizziness; dry mouth; dry skin; fruity breath; nausea; stomach pain; increased hunger or thirst; increased urination -signs and symptoms of kidney injury like trouble passing urine or change in the amount of urine -signs and symptoms of liver injury like dark urine, light-colored stools, loss of appetite, nausea, right upper belly pain, yellowing of the eyes or skin -stomach pain -sweating -weight loss Side effects that usually do not require medical attention (report to your doctor or health care professional if they continue or are bothersome): -decreased appetite -diarrhea -tiredness This list may not describe all possible side effects. Call your doctor for medical advice about side effects. You may report side effects to FDA at 1-800-FDA-1088. Where should I keep my medicine? This drug is given in a hospital or clinic and will not be stored at home. NOTE: This sheet is a summary. It may not cover all possible information. If you have questions about this medicine, talk to your doctor, pharmacist, or health care provider.  2018 Elsevier/Gold Standard (2016-07-27 12:29:36)

## 2018-06-15 NOTE — Telephone Encounter (Signed)
Appt added and patient notified while in infusion/ ok'd by [facility]/ per 8/15 sch msg

## 2018-06-15 NOTE — Progress Notes (Signed)
Per Dr. Julien Nordmann ok to proceed with treatment with 06/15/18 vital signs. Orders provided.

## 2018-06-15 NOTE — Progress Notes (Signed)
Spoke w/ Dr. Julien Nordmann, he knows about elevated calcium, fluids and zometa 4mg  ordered. Also received verbal order for percocet x1 for pt's pain.   Demetrius Charity, PharmD Oncology Pharmacist Pharmacy Phone: [phone] 06/15/2018

## 2018-06-15 NOTE — Progress Notes (Signed)
Discharge instructions provided. Patient, spouse, and daughter verbalized understanding of instructions and schedule.

## 2018-06-16 ENCOUNTER — Ambulatory Visit
Admission: RE | Admit: 2018-06-16 | Discharge: 2018-06-16 | Disposition: A | Discharge status: Home / Self Care | Payer: 59 | Source: Ambulatory Visit | Attending: Radiation Oncology | Admitting: Radiation Oncology

## 2018-06-16 ENCOUNTER — Inpatient Hospital Stay (HOSPITAL_BASED_OUTPATIENT_CLINIC_OR_DEPARTMENT_OTHER): Payer: 59 | Admitting: Medical

## 2018-06-16 ENCOUNTER — Other Ambulatory Visit: Payer: Self-pay | Admitting: *Deleted

## 2018-06-16 ENCOUNTER — Other Ambulatory Visit: Payer: Self-pay | Admitting: Internal Medicine

## 2018-06-16 ENCOUNTER — Encounter: Payer: Self-pay | Admitting: General Practice

## 2018-06-16 ENCOUNTER — Telehealth: Payer: Self-pay | Admitting: Internal Medicine

## 2018-06-16 ENCOUNTER — Inpatient Hospital Stay: Payer: 59

## 2018-06-16 VITALS — BP 112/70 | HR 87 | Temp 98.1°F | Resp 17

## 2018-06-16 DIAGNOSIS — R0602 Shortness of breath: Secondary | ICD-10-CM

## 2018-06-16 DIAGNOSIS — T8090XA Unspecified complication following infusion and therapeutic injection, initial encounter: Secondary | ICD-10-CM

## 2018-06-16 DIAGNOSIS — C3412 Malignant neoplasm of upper lobe, left bronchus or lung: Secondary | ICD-10-CM

## 2018-06-16 DIAGNOSIS — C7951 Secondary malignant neoplasm of bone: Secondary | ICD-10-CM | POA: Diagnosis not present

## 2018-06-16 MED ORDER — METHYLPREDNISOLONE SODIUM SUCC 125 MG IJ SOLR
125.0000 mg | Freq: Once | INTRAMUSCULAR | Status: AC | PRN
  Administered 2018-06-16: 125 mg via INTRAVENOUS

## 2018-06-16 MED ORDER — DIPHENHYDRAMINE HCL 50 MG/ML IJ SOLN
50.0000 mg | Freq: Once | INTRAMUSCULAR | Status: AC
  Administered 2018-06-16: 50 mg via INTRAVENOUS

## 2018-06-16 MED ORDER — SODIUM CHLORIDE 0.9 % IV SOLN
20.0000 mg | Freq: Once | INTRAVENOUS | Status: AC
  Administered 2018-06-16: 20 mg via INTRAVENOUS
  Filled 2018-06-16: qty 2

## 2018-06-16 MED ORDER — PALONOSETRON HCL INJECTION 0.25 MG/5ML
0.2500 mg | Freq: Once | INTRAVENOUS | Status: AC
  Administered 2018-06-16: 0.25 mg via INTRAVENOUS

## 2018-06-16 MED ORDER — FAMOTIDINE IN NACL 20-0.9 MG/50ML-% IV SOLN
INTRAVENOUS | Status: AC
  Filled 2018-06-16: qty 50

## 2018-06-16 MED ORDER — PALONOSETRON HCL INJECTION 0.25 MG/5ML
INTRAVENOUS | Status: AC
  Filled 2018-06-16: qty 5

## 2018-06-16 MED ORDER — FAMOTIDINE IN NACL 20-0.9 MG/50ML-% IV SOLN
20.0000 mg | Freq: Once | INTRAVENOUS | Status: AC
  Administered 2018-06-16: 20 mg via INTRAVENOUS

## 2018-06-16 MED ORDER — FOSAPREPITANT DIMEGLUMINE INJECTION 150 MG
Freq: Once | INTRAVENOUS | Status: AC
  Administered 2018-06-16: 11:00:00 via INTRAVENOUS
  Filled 2018-06-16: qty 5

## 2018-06-16 MED ORDER — SODIUM CHLORIDE 0.9 % IV SOLN
242.0000 mg | Freq: Once | INTRAVENOUS | Status: AC
  Administered 2018-06-16: 240 mg via INTRAVENOUS
  Filled 2018-06-16: qty 24

## 2018-06-16 MED ORDER — SODIUM CHLORIDE 0.9 % IV SOLN
Freq: Once | INTRAVENOUS | Status: AC
  Administered 2018-06-16: 10:00:00 via INTRAVENOUS
  Filled 2018-06-16: qty 250

## 2018-06-16 MED ORDER — DIPHENHYDRAMINE HCL 50 MG/ML IJ SOLN
INTRAMUSCULAR | Status: AC
  Filled 2018-06-16: qty 1

## 2018-06-16 MED ORDER — PACLITAXEL CHEMO INJECTION 300 MG/50ML
200.0000 mg/m2 | Freq: Once | INTRAVENOUS | Status: AC
  Administered 2018-06-16: 348 mg via INTRAVENOUS
  Filled 2018-06-16: qty 58

## 2018-06-16 NOTE — Progress Notes (Signed)
Per Dr Earlie Server ok to tx with VS and labs today.   During administration of pre-med (emend and decadron IVPB), Pt wife noticed pt having trouble breathing, pt stated "not ok" ... Infusion stopped at 1102, hypersensitivity protocol started, Sandi Mealy to infusion room, medications administered according to [facility] and  VS in flow sheet.  Pharmacy notified of Emend rxn, to be taken out of tx plan.

## 2018-06-16 NOTE — Patient Instructions (Addendum)
[facility] Discharge Instructions for Patients Receiving Chemotherapy  DO NOT TAKE THE FOLIC ACID PRESCRIBED/ NO LONGER NEEDED  Today you received the following chemotherapy agents Taxol and Carboplatin  To help prevent nausea and vomiting after your treatment, we encourage you to take your nausea medication as prescribed.  If you develop nausea and vomiting that is not controlled by your nausea medication, call the clinic.   BELOW ARE SYMPTOMS THAT SHOULD BE REPORTED IMMEDIATELY:  *FEVER GREATER THAN 100.5 F  *CHILLS WITH OR WITHOUT FEVER  NAUSEA AND VOMITING THAT IS NOT CONTROLLED WITH YOUR NAUSEA MEDICATION  *UNUSUAL SHORTNESS OF BREATH  *UNUSUAL BRUISING OR BLEEDING  TENDERNESS IN MOUTH AND THROAT WITH OR WITHOUT PRESENCE OF ULCERS  *URINARY PROBLEMS  *BOWEL PROBLEMS  UNUSUAL RASH Items with * indicate a potential emergency and should be followed up as soon as possible.  Feel free to call the clinic should you have any questions or concerns. The clinic phone number is [phone].  Please show the [facility] at check-in to the Emergency Department and triage nurse.  Paclitaxel injection (Taxol) What is this medicine? PACLITAXEL (PAK li TAX el) is a chemotherapy drug. It targets fast dividing cells, like cancer cells, and causes these cells to die. This medicine is used to treat ovarian cancer, breast cancer, and other cancers. This medicine may be used for other purposes; ask your health care provider or pharmacist if you have questions. COMMON BRAND NAME(S): Onxol, Taxol What should I tell my health care provider before I take this medicine? They need to know if you have any of these conditions: -blood disorders -irregular heartbeat -infection (especially a virus infection such as chickenpox, cold sores, or herpes) -liver disease -previous or ongoing radiation therapy -an unusual or allergic reaction to paclitaxel, alcohol, polyoxyethylated  castor oil, other chemotherapy agents, other medicines, foods, dyes, or preservatives -pregnant or trying to get pregnant -breast-feeding How should I use this medicine? This drug is given as an infusion into a vein. It is administered in a hospital or clinic by a specially trained health care professional. Talk to your pediatrician regarding the use of this medicine in children. Special care may be needed. Overdosage: If you think you have taken too much of this medicine contact a poison control center or emergency room at once. NOTE: This medicine is only for you. Do not share this medicine with others. What if I miss a dose? It is important not to miss your dose. Call your doctor or health care professional if you are unable to keep an appointment. What may interact with this medicine? Do not take this medicine with any of the following medications: -disulfiram -metronidazole This medicine may also interact with the following medications: -cyclosporine -diazepam -ketoconazole -medicines to increase blood counts like filgrastim, pegfilgrastim, sargramostim -other chemotherapy drugs like cisplatin, doxorubicin, epirubicin, etoposide, teniposide, vincristine -quinidine -testosterone -vaccines -verapamil Talk to your doctor or health care professional before taking any of these medicines: -acetaminophen -aspirin -ibuprofen -ketoprofen -naproxen This list may not describe all possible interactions. Give your health care provider a list of all the medicines, herbs, non-prescription drugs, or dietary supplements you use. Also tell them if you smoke, drink alcohol, or use illegal drugs. Some items may interact with your medicine. What should I watch for while using this medicine? Your condition will be monitored carefully while you are receiving this medicine. You will need important blood work done while you are taking this medicine. This medicine can  cause serious allergic reactions. To  reduce your risk you will need to take other medicine(s) before treatment with this medicine. If you experience allergic reactions like skin rash, itching or hives, swelling of the face, lips, or tongue, tell your doctor or health care professional right away. In some cases, you may be given additional medicines to help with side effects. Follow all directions for their use. This drug may make you feel generally unwell. This is not uncommon, as chemotherapy can affect healthy cells as well as cancer cells. Report any side effects. Continue your course of treatment even though you feel ill unless your doctor tells you to stop. Call your doctor or health care professional for advice if you get a fever, chills or sore throat, or other symptoms of a cold or flu. Do not treat yourself. This drug decreases your body's ability to fight infections. Try to avoid being around people who are sick. This medicine may increase your risk to bruise or bleed. Call your doctor or health care professional if you notice any unusual bleeding. Be careful brushing and flossing your teeth or using a toothpick because you may get an infection or bleed more easily. If you have any dental work done, tell your dentist you are receiving this medicine. Avoid taking products that contain aspirin, acetaminophen, ibuprofen, naproxen, or ketoprofen unless instructed by your doctor. These medicines may hide a fever. Do not become pregnant while taking this medicine. Women should inform their doctor if they wish to become pregnant or think they might be pregnant. There is a potential for serious side effects to an unborn child. Talk to your health care professional or pharmacist for more information. Do not breast-feed an infant while taking this medicine. Men are advised not to father a child while receiving this medicine. This product may contain alcohol. Ask your pharmacist or healthcare provider if this medicine contains alcohol. Be sure  to tell all healthcare providers you are taking this medicine. Certain medicines, like metronidazole and disulfiram, can cause an unpleasant reaction when taken with alcohol. The reaction includes flushing, headache, nausea, vomiting, sweating, and increased thirst. The reaction can last from 30 minutes to several hours. What side effects may I notice from receiving this medicine? Side effects that you should report to your doctor or health care professional as soon as possible: -allergic reactions like skin rash, itching or hives, swelling of the face, lips, or tongue -low blood counts - This drug may decrease the number of white blood cells, red blood cells and platelets. You may be at increased risk for infections and bleeding. -signs of infection - fever or chills, cough, sore throat, pain or difficulty passing urine -signs of decreased platelets or bleeding - bruising, pinpoint red spots on the skin, black, tarry stools, nosebleeds -signs of decreased red blood cells - unusually weak or tired, fainting spells, lightheadedness -breathing problems -chest pain -high or low blood pressure -mouth sores -nausea and vomiting -pain, swelling, redness or irritation at the injection site -pain, tingling, numbness in the hands or feet -slow or irregular heartbeat -swelling of the ankle, feet, hands Side effects that usually do not require medical attention (report to your doctor or health care professional if they continue or are bothersome): -bone pain -complete hair loss including hair on your head, underarms, pubic hair, eyebrows, and eyelashes -changes in the color of fingernails -diarrhea -loosening of the fingernails -loss of appetite -muscle or joint pain -red flush to skin -sweating This list may not  describe all possible side effects. Call your doctor for medical advice about side effects. You may report side effects to FDA at 1-800-FDA-1088. Where should I keep my medicine? This drug  is given in a hospital or clinic and will not be stored at home. NOTE: This sheet is a summary. It may not cover all possible information. If you have questions about this medicine, talk to your doctor, pharmacist, or health care provider.  2018 Elsevier/Gold Standard (2015-08-19 19:58:00)   Carboplatin injection What is this medicine? CARBOPLATIN (KAR boe pla tin) is a chemotherapy drug. It targets fast dividing cells, like cancer cells, and causes these cells to die. This medicine is used to treat ovarian cancer and many other cancers. This medicine may be used for other purposes; ask your health care provider or pharmacist if you have questions. COMMON BRAND NAME(S): Paraplatin What should I tell my health care provider before I take this medicine? They need to know if you have any of these conditions: -blood disorders -hearing problems -kidney disease -recent or ongoing radiation therapy -an unusual or allergic reaction to carboplatin, cisplatin, other chemotherapy, other medicines, foods, dyes, or preservatives -pregnant or trying to get pregnant -breast-feeding How should I use this medicine? This drug is usually given as an infusion into a vein. It is administered in a hospital or clinic by a specially trained health care professional. Talk to your pediatrician regarding the use of this medicine in children. Special care may be needed. Overdosage: If you think you have taken too much of this medicine contact a poison control center or emergency room at once. NOTE: This medicine is only for you. Do not share this medicine with others. What if I miss a dose? It is important not to miss a dose. Call your doctor or health care professional if you are unable to keep an appointment. What may interact with this medicine? -medicines for seizures -medicines to increase blood counts like filgrastim, pegfilgrastim, sargramostim -some antibiotics like amikacin, gentamicin, neomycin,  streptomycin, tobramycin -vaccines Talk to your doctor or health care professional before taking any of these medicines: -acetaminophen -aspirin -ibuprofen -ketoprofen -naproxen This list may not describe all possible interactions. Give your health care provider a list of all the medicines, herbs, non-prescription drugs, or dietary supplements you use. Also tell them if you smoke, drink alcohol, or use illegal drugs. Some items may interact with your medicine. What should I watch for while using this medicine? Your condition will be monitored carefully while you are receiving this medicine. You will need important blood work done while you are taking this medicine. This drug may make you feel generally unwell. This is not uncommon, as chemotherapy can affect healthy cells as well as cancer cells. Report any side effects. Continue your course of treatment even though you feel ill unless your doctor tells you to stop. In some cases, you may be given additional medicines to help with side effects. Follow all directions for their use. Call your doctor or health care professional for advice if you get a fever, chills or sore throat, or other symptoms of a cold or flu. Do not treat yourself. This drug decreases your body's ability to fight infections. Try to avoid being around people who are sick. This medicine may increase your risk to bruise or bleed. Call your doctor or health care professional if you notice any unusual bleeding. Be careful brushing and flossing your teeth or using a toothpick because you may get an infection or bleed  more easily. If you have any dental work done, tell your dentist you are receiving this medicine. Avoid taking products that contain aspirin, acetaminophen, ibuprofen, naproxen, or ketoprofen unless instructed by your doctor. These medicines may hide a fever. Do not become pregnant while taking this medicine. Women should inform their doctor if they wish to become  pregnant or think they might be pregnant. There is a potential for serious side effects to an unborn child. Talk to your health care professional or pharmacist for more information. Do not breast-feed an infant while taking this medicine. What side effects may I notice from receiving this medicine? Side effects that you should report to your doctor or health care professional as soon as possible: -allergic reactions like skin rash, itching or hives, swelling of the face, lips, or tongue -signs of infection - fever or chills, cough, sore throat, pain or difficulty passing urine -signs of decreased platelets or bleeding - bruising, pinpoint red spots on the skin, black, tarry stools, nosebleeds -signs of decreased red blood cells - unusually weak or tired, fainting spells, lightheadedness -breathing problems -changes in hearing -changes in vision -chest pain -high blood pressure -low blood counts - This drug may decrease the number of white blood cells, red blood cells and platelets. You may be at increased risk for infections and bleeding. -nausea and vomiting -pain, swelling, redness or irritation at the injection site -pain, tingling, numbness in the hands or feet -problems with balance, talking, walking -trouble passing urine or change in the amount of urine Side effects that usually do not require medical attention (report to your doctor or health care professional if they continue or are bothersome): -hair loss -loss of appetite -metallic taste in the mouth or changes in taste This list may not describe all possible side effects. Call your doctor for medical advice about side effects. You may report side effects to FDA at 1-800-FDA-1088. Where should I keep my medicine? This drug is given in a hospital or clinic and will not be stored at home. NOTE: This sheet is a summary. It may not cover all possible information. If you have questions about this medicine, talk to your doctor,  pharmacist, or health care provider.  2018 Elsevier/Gold Standard (2008-01-23 14:38:05)

## 2018-06-16 NOTE — Progress Notes (Signed)
Labs reviewed by MD VO Ok to treat despite labs

## 2018-06-16 NOTE — Progress Notes (Signed)
[facility] Psychosocial Distress Screening Clinical Social Work  Clinical Social Work was referred by distress screening protocol.  The patient scored a 5 on the Psychosocial Distress Thermometer which indicates moderate distress. Clinical Social Worker contacted patient by phone to assess for distress and other psychosocial needs. No answer, left generic VM asking for return call if desired.  Also left brief description of services available in [facility].    [facility] DISTRESS SCREENING 06/08/2018  Screening Type Initial Screening  Distress experienced in past week (1-10) 5  Emotional problem type Depression;Adjusting to illness;Feeling hopeless;Adjusting to appearance changes  Physical Problem type Pain;Getting around;Bathing/dressing;Loss of appetitie;Sexual problems  Other [phone]     Clinical Social Worker follow up needed: No.  If yes, follow up plan:  Beverely Pace, [place], LCSW Clinical Social Worker Phone:  [phone]

## 2018-06-16 NOTE — Telephone Encounter (Signed)
Left message for patient re 8/19 injection appointment. Also confirmed 8/17 appointment. Injection for 8/19 scheduled per 8/16 schedule message.

## 2018-06-16 NOTE — Progress Notes (Signed)
At MD request, I s/w infusion RN and she will inform pt he does not need to take Folic Acid tablets since he is no on Alimta. Kennith Center, Pharm.D., CPP 06/16/2018@10 :43 AM

## 2018-06-17 ENCOUNTER — Inpatient Hospital Stay: Payer: 59

## 2018-06-17 MED ORDER — PEGFILGRASTIM-CBQV 6 MG/0.6ML ~~LOC~~ SOSY
PREFILLED_SYRINGE | SUBCUTANEOUS | Status: AC
  Filled 2018-06-17: qty 0.6

## 2018-06-18 ENCOUNTER — Ambulatory Visit: Admission: RE | Admit: 2018-06-18 | Payer: 59 | Source: Ambulatory Visit

## 2018-06-19 ENCOUNTER — Encounter (HOSPITAL_COMMUNITY): Payer: Self-pay | Admitting: Emergency Medicine

## 2018-06-19 ENCOUNTER — Telehealth: Payer: Self-pay | Admitting: *Deleted

## 2018-06-19 ENCOUNTER — Ambulatory Visit: Payer: 59

## 2018-06-19 ENCOUNTER — Inpatient Hospital Stay (HOSPITAL_COMMUNITY): Payer: 59

## 2018-06-19 ENCOUNTER — Inpatient Hospital Stay (HOSPITAL_COMMUNITY)
Admission: EM | Admit: 2018-06-19 | Discharge: 2018-06-26 | DRG: 393 | Disposition: A | Discharge status: Hospice | Payer: 59 | Attending: Internal Medicine | Admitting: Internal Medicine

## 2018-06-19 ENCOUNTER — Ambulatory Visit
Admission: RE | Admit: 2018-06-19 | Discharge: 2018-06-19 | Disposition: A | Discharge status: Home / Self Care | Payer: 59 | Source: Ambulatory Visit | Attending: Radiation Oncology | Admitting: Radiation Oncology

## 2018-06-19 ENCOUNTER — Other Ambulatory Visit: Payer: Self-pay

## 2018-06-19 ENCOUNTER — Encounter (HOSPITAL_COMMUNITY): Payer: Self-pay | Admitting: Internal Medicine

## 2018-06-19 DIAGNOSIS — Z515 Encounter for palliative care: Secondary | ICD-10-CM

## 2018-06-19 DIAGNOSIS — K922 Gastrointestinal hemorrhage, unspecified: Secondary | ICD-10-CM | POA: Diagnosis present

## 2018-06-19 DIAGNOSIS — N189 Chronic kidney disease, unspecified: Secondary | ICD-10-CM

## 2018-06-19 DIAGNOSIS — R197 Diarrhea, unspecified: Secondary | ICD-10-CM | POA: Diagnosis present

## 2018-06-19 DIAGNOSIS — E43 Unspecified severe protein-calorie malnutrition: Secondary | ICD-10-CM | POA: Diagnosis present

## 2018-06-19 DIAGNOSIS — M2550 Pain in unspecified joint: Secondary | ICD-10-CM | POA: Diagnosis not present

## 2018-06-19 DIAGNOSIS — G893 Neoplasm related pain (acute) (chronic): Secondary | ICD-10-CM

## 2018-06-19 DIAGNOSIS — Z5111 Encounter for antineoplastic chemotherapy: Secondary | ICD-10-CM | POA: Diagnosis not present

## 2018-06-19 DIAGNOSIS — C7970 Secondary malignant neoplasm of unspecified adrenal gland: Secondary | ICD-10-CM | POA: Diagnosis not present

## 2018-06-19 DIAGNOSIS — E860 Dehydration: Secondary | ICD-10-CM | POA: Diagnosis not present

## 2018-06-19 DIAGNOSIS — N179 Acute kidney failure, unspecified: Secondary | ICD-10-CM | POA: Diagnosis not present

## 2018-06-19 DIAGNOSIS — C3490 Malignant neoplasm of unspecified part of unspecified bronchus or lung: Secondary | ICD-10-CM

## 2018-06-19 DIAGNOSIS — I951 Orthostatic hypotension: Secondary | ICD-10-CM | POA: Diagnosis not present

## 2018-06-19 DIAGNOSIS — R392 Extrarenal uremia: Secondary | ICD-10-CM | POA: Diagnosis not present

## 2018-06-19 DIAGNOSIS — R1084 Generalized abdominal pain: Secondary | ICD-10-CM | POA: Diagnosis not present

## 2018-06-19 DIAGNOSIS — I129 Hypertensive chronic kidney disease with stage 1 through stage 4 chronic kidney disease, or unspecified chronic kidney disease: Secondary | ICD-10-CM | POA: Diagnosis present

## 2018-06-19 DIAGNOSIS — R0902 Hypoxemia: Secondary | ICD-10-CM | POA: Diagnosis not present

## 2018-06-19 DIAGNOSIS — N183 Chronic kidney disease, stage 3 (moderate): Secondary | ICD-10-CM | POA: Diagnosis present

## 2018-06-19 DIAGNOSIS — D509 Iron deficiency anemia, unspecified: Secondary | ICD-10-CM | POA: Diagnosis present

## 2018-06-19 DIAGNOSIS — C7931 Secondary malignant neoplasm of brain: Secondary | ICD-10-CM | POA: Diagnosis present

## 2018-06-19 DIAGNOSIS — D6859 Other primary thrombophilia: Secondary | ICD-10-CM | POA: Diagnosis present

## 2018-06-19 DIAGNOSIS — L8990 Pressure ulcer of unspecified site, unspecified stage: Secondary | ICD-10-CM | POA: Diagnosis present

## 2018-06-19 DIAGNOSIS — R740 Nonspecific elevation of levels of transaminase and lactic acid dehydrogenase [LDH]: Secondary | ICD-10-CM | POA: Diagnosis not present

## 2018-06-19 DIAGNOSIS — K529 Noninfective gastroenteritis and colitis, unspecified: Secondary | ICD-10-CM | POA: Diagnosis not present

## 2018-06-19 DIAGNOSIS — Z66 Do not resuscitate: Secondary | ICD-10-CM | POA: Diagnosis not present

## 2018-06-19 DIAGNOSIS — Z7401 Bed confinement status: Secondary | ICD-10-CM | POA: Diagnosis not present

## 2018-06-19 DIAGNOSIS — L89152 Pressure ulcer of sacral region, stage 2: Secondary | ICD-10-CM | POA: Diagnosis present

## 2018-06-19 DIAGNOSIS — G9341 Metabolic encephalopathy: Secondary | ICD-10-CM | POA: Diagnosis present

## 2018-06-19 DIAGNOSIS — R29702 NIHSS score 2: Secondary | ICD-10-CM | POA: Diagnosis not present

## 2018-06-19 DIAGNOSIS — T451X5A Adverse effect of antineoplastic and immunosuppressive drugs, initial encounter: Secondary | ICD-10-CM | POA: Diagnosis present

## 2018-06-19 DIAGNOSIS — I639 Cerebral infarction, unspecified: Secondary | ICD-10-CM | POA: Diagnosis not present

## 2018-06-19 DIAGNOSIS — E785 Hyperlipidemia, unspecified: Secondary | ICD-10-CM | POA: Diagnosis not present

## 2018-06-19 DIAGNOSIS — R410 Disorientation, unspecified: Secondary | ICD-10-CM | POA: Diagnosis not present

## 2018-06-19 DIAGNOSIS — R29703 NIHSS score 3: Secondary | ICD-10-CM | POA: Diagnosis not present

## 2018-06-19 DIAGNOSIS — G9340 Encephalopathy, unspecified: Secondary | ICD-10-CM

## 2018-06-19 DIAGNOSIS — K219 Gastro-esophageal reflux disease without esophagitis: Secondary | ICD-10-CM | POA: Diagnosis not present

## 2018-06-19 DIAGNOSIS — R29701 NIHSS score 1: Secondary | ICD-10-CM | POA: Diagnosis not present

## 2018-06-19 DIAGNOSIS — C7951 Secondary malignant neoplasm of bone: Secondary | ICD-10-CM | POA: Diagnosis not present

## 2018-06-19 DIAGNOSIS — K5900 Constipation, unspecified: Secondary | ICD-10-CM | POA: Diagnosis not present

## 2018-06-19 DIAGNOSIS — G459 Transient cerebral ischemic attack, unspecified: Secondary | ICD-10-CM | POA: Diagnosis not present

## 2018-06-19 DIAGNOSIS — I10 Essential (primary) hypertension: Secondary | ICD-10-CM | POA: Diagnosis not present

## 2018-06-19 DIAGNOSIS — D638 Anemia in other chronic diseases classified elsewhere: Secondary | ICD-10-CM | POA: Diagnosis present

## 2018-06-19 DIAGNOSIS — R5381 Other malaise: Secondary | ICD-10-CM | POA: Diagnosis not present

## 2018-06-19 DIAGNOSIS — K521 Toxic gastroenteritis and colitis: Secondary | ICD-10-CM | POA: Diagnosis present

## 2018-06-19 DIAGNOSIS — R7401 Elevation of levels of liver transaminase levels: Secondary | ICD-10-CM

## 2018-06-19 DIAGNOSIS — N19 Unspecified kidney failure: Secondary | ICD-10-CM

## 2018-06-19 DIAGNOSIS — E872 Acidosis: Secondary | ICD-10-CM | POA: Diagnosis present

## 2018-06-19 DIAGNOSIS — C3412 Malignant neoplasm of upper lobe, left bronchus or lung: Secondary | ICD-10-CM | POA: Diagnosis not present

## 2018-06-19 DIAGNOSIS — I6381 Other cerebral infarction due to occlusion or stenosis of small artery: Secondary | ICD-10-CM | POA: Diagnosis not present

## 2018-06-19 HISTORY — DX: Malignant (primary) neoplasm, unspecified: C80.1

## 2018-06-19 LAB — COMPREHENSIVE METABOLIC PANEL
ALT: 69 U/L — AB (ref 0–44)
AST: 30 U/L (ref 15–41)
Albumin: 2.4 g/dL — ABNORMAL LOW (ref 3.5–5.0)
Alkaline Phosphatase: 184 U/L — ABNORMAL HIGH (ref 38–126)
Anion gap: 12 (ref 5–15)
BILIRUBIN TOTAL: 1.2 mg/dL (ref 0.3–1.2)
BUN: 95 mg/dL — AB (ref 8–23)
CHLORIDE: 110 mmol/L (ref 98–111)
CO2: 21 mmol/L — ABNORMAL LOW (ref 22–32)
CREATININE: 2.36 mg/dL — AB (ref 0.61–1.24)
Calcium: 8.9 mg/dL (ref 8.9–10.3)
GFR, EST AFRICAN AMERICAN: 29 mL/min — AB (ref >60)
GFR, EST NON AFRICAN AMERICAN: 25 mL/min — AB (ref >60)
Glucose, Bld: 119 mg/dL — ABNORMAL HIGH (ref 70–99)
Potassium: 4.5 mmol/L (ref 3.5–5.1)
Sodium: 143 mmol/L (ref 135–145)
TOTAL PROTEIN: 7.2 g/dL (ref 6.5–8.1)

## 2018-06-19 LAB — URINALYSIS, ROUTINE W REFLEX MICROSCOPIC
BILIRUBIN URINE: NEGATIVE
Bacteria, UA: NONE SEEN
GLUCOSE, UA: NEGATIVE mg/dL
Ketones, ur: NEGATIVE mg/dL
Leukocytes, UA: NEGATIVE
NITRITE: NEGATIVE
PH: 5 (ref 5.0–8.0)
Protein, ur: 30 mg/dL — AB
SPECIFIC GRAVITY, URINE: 1.016 (ref 1.005–1.030)

## 2018-06-19 LAB — CBC
HCT: 31 % — ABNORMAL LOW (ref 39.0–52.0)
Hemoglobin: 9.9 g/dL — ABNORMAL LOW (ref 13.0–17.0)
MCH: 23.5 pg — ABNORMAL LOW (ref 26.0–34.0)
MCHC: 31.9 g/dL (ref 30.0–36.0)
MCV: 73.6 fL — AB (ref 78.0–100.0)
PLATELETS: 285 10*3/uL (ref 150–400)
RBC: 4.21 MIL/uL — AB (ref 4.22–5.81)
RDW: 16.5 % — AB (ref 11.5–15.5)
WBC: 8.6 10*3/uL (ref 4.0–10.5)

## 2018-06-19 LAB — MAGNESIUM: MAGNESIUM: 2.7 mg/dL — AB (ref 1.7–2.4)

## 2018-06-19 LAB — LIPASE, BLOOD: LIPASE: 20 U/L (ref 11–51)

## 2018-06-19 MED ORDER — OXYCODONE-ACETAMINOPHEN 5-325 MG PO TABS
1.0000 | ORAL_TABLET | Freq: Four times a day (QID) | ORAL | Status: DC | PRN
  Administered 2018-06-19 – 2018-06-25 (×6): 1 via ORAL
  Filled 2018-06-19 (×6): qty 1

## 2018-06-19 MED ORDER — PANTOPRAZOLE SODIUM 40 MG PO TBEC
40.0000 mg | DELAYED_RELEASE_TABLET | Freq: Every day | ORAL | Status: DC
  Administered 2018-06-19 – 2018-06-25 (×7): 40 mg via ORAL
  Filled 2018-06-19 (×7): qty 1

## 2018-06-19 MED ORDER — IOHEXOL 300 MG/ML  SOLN
30.0000 mL | Freq: Once | INTRAMUSCULAR | Status: AC | PRN
  Administered 2018-06-19: 30 mL via ORAL

## 2018-06-19 MED ORDER — ACETAMINOPHEN 325 MG PO TABS: 650.0000 mg | ORAL_TABLET | Freq: Four times a day (QID) | ORAL | Status: DC | PRN

## 2018-06-19 MED ORDER — ALUM & MAG HYDROXIDE-SIMETH 200-200-20 MG/5ML PO SUSP
30.0000 mL | ORAL | Status: DC | PRN
  Administered 2018-06-19: 30 mL via ORAL
  Filled 2018-06-19: qty 30

## 2018-06-19 MED ORDER — ONDANSETRON HCL 4 MG PO TABS
4.0000 mg | ORAL_TABLET | Freq: Four times a day (QID) | ORAL | Status: DC | PRN
  Filled 2018-06-19: qty 1

## 2018-06-19 MED ORDER — LACTATED RINGERS IV BOLUS
1000.0000 mL | Freq: Once | INTRAVENOUS | Status: AC
  Administered 2018-06-19: 1000 mL via INTRAVENOUS

## 2018-06-19 MED ORDER — MECLIZINE HCL 25 MG PO TABS
50.0000 mg | ORAL_TABLET | Freq: Three times a day (TID) | ORAL | Status: DC | PRN
  Filled 2018-06-19: qty 2

## 2018-06-19 MED ORDER — ONDANSETRON HCL 4 MG/2ML IJ SOLN
4.0000 mg | Freq: Four times a day (QID) | INTRAMUSCULAR | Status: DC | PRN
  Administered 2018-06-20: 4 mg via INTRAVENOUS
  Filled 2018-06-19: qty 2

## 2018-06-19 MED ORDER — HEPARIN SODIUM (PORCINE) 5000 UNIT/ML IJ SOLN
5000.0000 [IU] | Freq: Three times a day (TID) | INTRAMUSCULAR | Status: DC
  Administered 2018-06-19: 5000 [IU] via SUBCUTANEOUS
  Filled 2018-06-19: qty 1

## 2018-06-19 MED ORDER — PREDNISONE 50 MG PO TABS
65.0000 mg | ORAL_TABLET | Freq: Every day | ORAL | Status: DC
  Administered 2018-06-20 – 2018-06-21 (×2): 65 mg via ORAL
  Filled 2018-06-19 (×2): qty 1

## 2018-06-19 MED ORDER — OMEPRAZOLE MAGNESIUM 20 MG PO TBEC: 20.0000 mg | DELAYED_RELEASE_TABLET | Freq: Every day | ORAL | Status: DC

## 2018-06-19 MED ORDER — TRAMADOL HCL 50 MG PO TABS: 25.0000 mg | ORAL_TABLET | Freq: Two times a day (BID) | ORAL | Status: DC | PRN

## 2018-06-19 MED ORDER — ACETAMINOPHEN 650 MG RE SUPP: 650.0000 mg | Freq: Four times a day (QID) | RECTAL | Status: DC | PRN

## 2018-06-19 MED ORDER — PROCHLORPERAZINE MALEATE 10 MG PO TABS: 10.0000 mg | ORAL_TABLET | Freq: Four times a day (QID) | ORAL | Status: DC | PRN

## 2018-06-19 MED ORDER — ASPIRIN EC 81 MG PO TBEC: 81.0000 mg | DELAYED_RELEASE_TABLET | Freq: Every day | ORAL | Status: DC

## 2018-06-19 MED ORDER — SIMVASTATIN 40 MG PO TABS
40.0000 mg | ORAL_TABLET | Freq: Every evening | ORAL | Status: DC
  Administered 2018-06-19 – 2018-06-24 (×6): 40 mg via ORAL
  Filled 2018-06-19 (×7): qty 1

## 2018-06-19 MED ORDER — SODIUM CHLORIDE 0.9 % IV SOLN
INTRAVENOUS | Status: DC
  Administered 2018-06-19 – 2018-06-20 (×2): via INTRAVENOUS

## 2018-06-19 MED ORDER — PEGFILGRASTIM-CBQV 6 MG/0.6ML ~~LOC~~ SOSY
PREFILLED_SYRINGE | SUBCUTANEOUS | Status: AC
Start: 2018-06-19 — End: ?
  Filled 2018-06-19: qty 0.6

## 2018-06-19 MED ORDER — PREDNISONE 20 MG PO TABS
60.0000 mg | ORAL_TABLET | Freq: Once | ORAL | Status: AC
  Administered 2018-06-19: 60 mg via ORAL
  Filled 2018-06-19: qty 3

## 2018-06-19 NOTE — Progress Notes (Signed)
    DATE:  07/17/2018                                          X CHEMO/IMMUNOTHERAPY REACTION            MD: Dr. Fanny Bien. Mohamed   AGENT/BLOOD PRODUCT RECEIVING TODAY:              Carboplatin and Taxol    AGENT/BLOOD PRODUCT RECEIVING IMMEDIATELY PRIOR TO REACTION:          Emend and dexamethasone    VS: BP:     110/85   P:       93       SPO2:       98% (Oxygen at 2 LPM via [place])                     REACTION(S):           Shortness of breath after receiving Emend   PREMEDS:     Benadryl, Pepcid, Aloxi, Emend and dexamethasone    INTERVENTION: Emend was stopped. Patient was given dexamethasone 20 mg IV   Review of Systems  Review of Systems  Constitutional: Negative for chills, diaphoresis and fever.  HENT: Negative for trouble swallowing and voice change.   Respiratory: Positive for shortness of breath. Negative for cough, chest tightness and wheezing.   Cardiovascular: Negative for chest pain and palpitations.  Gastrointestinal: Negative for abdominal pain, constipation, diarrhea, nausea and vomiting.  Musculoskeletal: Positive for back pain. Negative for myalgias.  Neurological: Negative for dizziness, light-headedness and headaches.     Physical Exam  Physical Exam  Constitutional: No distress.  The patient is an elderly chronically ill-appearing male who is minimally communicative.  His wife helps to answer questions for the patient today.  She states that he is at his baseline today.  The patient is able to acknowledge that he was short of breath and was having back pain.  HENT:  Head: Normocephalic and atraumatic.  Cardiovascular: Normal rate, regular rhythm and normal heart sounds. Exam reveals no friction rub.  No murmur heard. Pulmonary/Chest: Effort normal and breath sounds normal. No stridor. No respiratory distress. He has no wheezes. He has no rales.  Skin: Skin is warm and dry. No rash noted. He is not diaphoretic. No erythema.    OUTCOME:              Mr.  Hobbs symptoms resolved after Emend was stopped.  He was able to complete his infusion of carboplatin and Taxol without any additional issues of concern.      Sandi Mealy, MHS, PA-C  This case was discussed with Dr. Burr Medico. She expressed agreement with my management of this patient.

## 2018-06-19 NOTE — H&P (Signed)
History and Physical    Zachary Hobbs SLH:[id number] DOB: 1941-01-30 DOA: 06/19/2018  PCP: Janie Morning, DO   Patient coming from: Home  Chief Complaint: Diarrhea  HPI: Zachary Hobbs is a 77 y.o. male with medical history significant of metastatic small cell lung cancer, hypertension, CKD stage III, GERD, hyperlipidemia, anemia, and other comorbidities who presents with a chief complaint of diarrhea.  Patient states that Friday he had chemotherapy and immunotherapy and then Saturday morning he started having some intermittent abdominal pain that was diffuse and crampy along with significant loose watery diarrhea that is now improved slightly and is now more soft and "mushy".  Patient's wife is at bedside and provides some of the history as patient is slightly confused.  Denies any nausea, vomiting, chest pain, shortness breath, burning or discomfort in the urinary tract.  Did have some dizziness and did fall once likely secondary to dehydration.  ED discussed with patient's primary oncologist who felt this may be an immune mediated.  Because of significant amount of diarrhea as patient is going almost every hour or 30 minutes [facility] was called to admit this patient to rule out colitis.  ED Course: Had basic blood work done and was given 2 L of lactated Ringer's.  Patient's primary oncologist was called who felt this was immune mediated so recommended admission and starting prednisone 1 make per cake.  Review of Systems: As per HPI otherwise 10 point review of systems negative.   Past Medical History:  Diagnosis Date  . Cancer ([place])    small cell carcinoma lung  . Hypertension    Past Surgical History:  Procedure Laterality Date  . COLONOSCOPY     SOCIAL HISTORY  reports that he has never smoked. He has never used smokeless tobacco. He reports that he does not drink alcohol or use drugs.  Allergies  Allergen Reactions  . Rosuvastatin     REACTION: myalgia   Family History  Problem  Relation Age of Onset  . Mesothelioma Sister    Prior to Admission medications   Medication Sig Start Date End Date Taking? Authorizing Provider  aspirin EC 81 MG tablet Take 81 mg by mouth daily.   Yes [provider]  meclizine (ANTIVERT) 50 MG tablet Take 1 tablet (50 mg total) by mouth 3 (three) times daily as needed. Patient taking differently: Take 50 mg by mouth 3 (three) times daily as needed for dizziness.  12/19/15  Yes Mackuen, Courteney Lyn, MD  omeprazole (PRILOSEC OTC) 20 MG tablet Take 20 mg by mouth daily.   Yes [provider]  ondansetron (ZOFRAN) 8 MG tablet Take 1 tablet (8 mg total) by mouth every 8 (eight) hours as needed for nausea or vomiting. 06/08/18  Yes Hayden Pedro, PA-C  oxyCODONE-acetaminophen (PERCOCET/ROXICET) 5-325 MG tablet Take 1 tablet by mouth every 6 (six) hours as needed for severe pain. 05/31/18  Yes Curcio, Roselie Awkward, NP  prochlorperazine (COMPAZINE) 10 MG tablet Take 1 tablet (10 mg total) by mouth every 6 (six) hours as needed for nausea or vomiting. 06/09/18  Yes Curt Bears, MD  simvastatin (ZOCOR) 40 MG tablet Take 40 mg by mouth every evening.   Yes [provider]  traMADol (ULTRAM) 50 MG tablet Take 25-50 mg by mouth 3 (three) times daily as needed for moderate pain.  05/15/18  Yes [provider]  valsartan-hydrochlorothiazide (DIOVAN-HCT) 320-25 MG tablet Take 1 tablet by mouth daily. 12/05/15  Yes [provider]   Physical  Exam: Vitals:   06/19/18 1235 06/19/18 1300 06/19/18 1330 06/19/18 1407  BP: 110/66 (!) 110/34 110/65 115/64  Pulse: 95 80 79 87  Resp: 18   14  Temp:    97.6 F (36.4 C)  TempSrc:    Oral  SpO2: 96% 97% 97% 100%   Constitutional: WN/W thin AAM in NAD and appears calm and comfortable but appears slightly confused Eyes: Lids and conjunctivae normal, sclerae anicteric  ENMT: External Ears, Nose appear normal. Grossly normal hearing. Mucous membranes are moist Neck:  Appears normal, supple, no cervical masses, normal ROM, no appreciable thyromegaly, no JVD Respiratory: Diminished to auscultation bilaterally, no wheezing, rales, rhonchi or crackles. Normal respiratory effort and patient is not tachypenic. No accessory muscle use.  Cardiovascular: RRR, no murmurs / rubs / gallops. S1 and S2 auscultated. No extremity edema. Abdomen: Soft, slightly tender, non-distended. No masses palpated. No appreciable hepatosplenomegaly. Bowel sounds positive x4.  GU: Deferred. Musculoskeletal: No clubbing / cyanosis of digits/nails. No joint deformity upper and lower extremities. Skin: No rashes, lesions, ulcers on a limited skin eval. No induration; Warm and dry.  Neurologic: CN 2-12 grossly intact with no focal deficits.  Romberg sign and cerebellar reflexes not assessed.  Psychiatric: Normal judgment and insight. Alert and oriented x 3. Normal mood and appropriate affect.   Labs on Admission: I have personally reviewed following labs and imaging studies  CBC: Recent Labs  Lab 06/15/18 1310 06/19/18 1014  WBC 9.7 8.6  NEUTROABS 7.8*  --   HGB 9.8* 9.9*  HCT 30.7* 31.0*  MCV 74.0* 73.6*  PLT 400 130   Basic Metabolic Panel: Recent Labs  Lab 06/15/18 1310 06/19/18 1014  NA 139 143  K 5.2* 4.5  CL 103 110  CO2 19* 21*  GLUCOSE 94 119*  BUN 110* 95*  CREATININE 2.25* 2.36*  CALCIUM 11.3* 8.9  MG  --  2.7*   GFR: Estimated Creatinine Clearance: 23.9 mL/min (A) (by C-G formula based on SCr of 2.36 mg/dL (H)). Liver Function Tests: Recent Labs  Lab 06/15/18 1310 06/19/18 1014  AST 26 30  ALT 57* 69*  ALKPHOS 198* 184*  BILITOT 1.0 1.2  PROT 8.2* 7.2  ALBUMIN 2.3* 2.4*   Recent Labs  Lab 06/19/18 1014  LIPASE 20   No results for input(s): AMMONIA in the last 168 hours. Coagulation Profile: No results for input(s): INR, PROTIME in the last 168 hours. Cardiac Enzymes: No results for input(s): CKTOTAL, CKMB, CKMBINDEX, TROPONINI in the last  168 hours. BNP (last 3 results) No results for input(s): PROBNP in the last 8760 hours. HbA1C: No results for input(s): HGBA1C in the last 72 hours. CBG: No results for input(s): GLUCAP in the last 168 hours. Lipid Profile: No results for input(s): CHOL, HDL, LDLCALC, TRIG, CHOLHDL, LDLDIRECT in the last 72 hours. Thyroid Function Tests: No results for input(s): TSH, T4TOTAL, FREET4, T3FREE, THYROIDAB in the last 72 hours. Anemia Panel: No results for input(s): VITAMINB12, FOLATE, FERRITIN, TIBC, IRON, RETICCTPCT in the last 72 hours. Urine analysis:    Component Value Date/Time   COLORURINE AMBER (A) 06/19/2018 1415   APPEARANCEUR HAZY (A) 06/19/2018 1415   LABSPEC 1.016 06/19/2018 1415   PHURINE 5.0 06/19/2018 1415   GLUCOSEU NEGATIVE 06/19/2018 1415   GLUCOSEU NEGATIVE 02/18/2010 0925   HGBUR SMALL (A) 06/19/2018 1415   BILIRUBINUR NEGATIVE 06/19/2018 1415   KETONESUR NEGATIVE 06/19/2018 1415   PROTEINUR 30 (A) 06/19/2018 1415   UROBILINOGEN 0.2 02/18/2010 0925   NITRITE  NEGATIVE 06/19/2018 1415   LEUKOCYTESUR NEGATIVE 06/19/2018 1415   Sepsis Labs: !!!!!!!!!!!!!!!!!!!!!!!!!!!!!!!!!!!!!!!!!!!! @LABRCNTIP (procalcitonin:4,lacticidven:4) )No results found for this or any previous visit (from the past 240 hour(s)).   Radiological Exams on Admission: Ct Abdomen Pelvis Wo Contrast  Result Date: 06/19/2018 CLINICAL DATA:  Small cell carcinoma of the lung, inpatient for chemotherapy in immunotherapy, abdominal pain EXAM: CT ABDOMEN AND PELVIS WITHOUT CONTRAST TECHNIQUE: Multidetector CT imaging of the abdomen and pelvis was performed following the standard protocol without IV contrast. COMPARISON:  Multiple exams, including PET-CT from 05/29/2018 FINDINGS: Lower chest: Numerous metastatic lesions in the lung bases. Index lesion in the left lower lobe posterior basal segment measures 3.0 by 2.2 cm, formerly 2.9 by 2.2 cm. Increased pleural thickening at the left lung base posteriorly,  possibly from pleural tumor. Hepatobiliary: Accentuated density in the gallbladder possibly from sludge. Low sensitivity for early metastatic disease to the liver due to lack of IV contrast. Pancreas: Unremarkable Spleen: Unremarkable Adrenals/Urinary Tract: Previously hypermetabolic right adrenal mass 1.6 cm transverse, previously 1.5 cm. Previously hypermetabolic left adrenal mass 2.6 by 2.1 cm on image 18/2, formerly 1.8 by 2.5 cm. No urinary tract calculi observed. The renal contours appear normal. Stomach/Bowel: Small amount of retained contrast medium in the colon. No dilated bowel. There is some formed stool in the distal colon. No significant degree of bowel wall thickening identified. Vascular/Lymphatic: Aortoiliac atherosclerotic vascular disease. No pathologic adenopathy identified. Reproductive: Unremarkable Other: New presacral edema. Musculoskeletal: The patient has known widespread osseous metastatic disease much more conspicuous on prior PET-CT than on today's diagnostic CT examination. There are some subtle lucencies corresponding such to some of the known lesions (for example, the right pubic bone and the left ninth rib laterally) but most of the lesions are essentially completely occult. There is mild presacral edema but I do not see a definite sacral fracture. Lumbar spondylosis and degenerative disc disease cause moderate bilateral foraminal stenosis at L5-S1. IMPRESSION: 1. There is some formed stool and contrast medium in the distal colon. No dilated bowel. No significant abnormal bowel wall thickening to suggest over colitis. 2. Increased pleural thickening at the left lung base posteriorly, potentially from pleural tumor. 3. New presacral edema. No definite sacral fracture is identified. Cause of this presacral edema is uncertain. 4. The patient has known innumerable bony lesions based on recent PET-CT, but these are for the most part occult on today's diagnostic CT. 5. Innumerable pulmonary  nodules and masses in the lung bases, essentially stable. 6. Bilateral metastatic lesions in the adrenal glands, stable. 7. Bilateral foraminal impingement at L5-S1. 8.  Aortic Atherosclerosis (ICD10-I70.0). Electronically Signed   By: Van Clines M.D.   On: 06/19/2018 17:28   EKG: No EKG done in the ED when patient arrived so will order one now.   Assessment/Plan Active Problems:   HLD (hyperlipidemia)   Essential hypertension   Malignant neoplasm of upper lobe of left lung (HCC)   Diarrhea   GERD (gastroesophageal reflux disease)   Microcytic anemia   Acute kidney injury superimposed on chronic kidney disease (HCC)   Hypermagnesemia   Elevated AST (SGOT)   Confusion   Significant Diarrhea with Abdominal Tenderness r/o Colitis and suspect Immune Mediated/Chemotherapy mediated -Admit to Inpatient Med Surge -Likely in the Setting of Chemotherapy and Immuno thearpy and ? Immune Mediated Colitis  -Check CT of Abd and Pelvis to further evaluate -On IVF for Supportive Care -Not febrile and has no Leukocytosis so less likley infectious -C Difficile lower on Differential  but if persists may need to check -May need antidiarrheals  Small Cell Carcinoma of the Lungs -Sees Dr. Julien Nordmann and recently had chemotherapy and Immunotherapy -Start Empiric Steroids for possible Immune mediated colitis -Dr. Julien Nordmann Notified and will see the patient    HTN -Hold Valsartan-Hydrochlorothiazide given his diarrhea and lower blood pressures  GERD -Continue with pantoprazole 40 mill grams p.o. daily  HLD -Continue with Simvastatin 40 g p.o. nightly  Microcytic Anemia -Hemoglobin/hematocrit appears stable -In the setting of likely chemotherapy and immunotherapy -Continue monitor for signs and symptoms of bleeding -Repeat CBC in a.m.  AKI on CKD stage 3 -BUN/creatinine was 95/2.36 and patient was slightly uremic with mild confusion -Cr seems worse in the last month as last BUN/creatinine last  month was 40/1.94 -Avoid nephrotoxic medications and hold losartan-hydrochlorothiazide currently -Continue monitor and trend renal function -Continue with IV fluid hydration as above  Hypermagnesemia -Patient magnesium level 2.7 -Continue to monitor and repeat magnesium level in a.m.  Elevated AST -Likely in the setting of chemotherapy -Continue monitor trend -Repeat CMP in the a.m.  Mild confusion -The setting of diarrhea and dehydration -Continue IV fluid hydration -May need to pursue head imaging if does not improve  DVT prophylaxis: Heparin 5,000 units sq  Code Status: FULL CODE Family Communication: Discussed with Wife and son Disposition Plan: D/C Home when Medically Stable Consults called: Medical Oncology Dr. Julien Nordmann Admission status: Inpatient Med-Surge  Severity of Illness: The appropriate patient status for this patient is INPATIENT. Inpatient status is judged to be reasonable and necessary in order to provide the required intensity of service to ensure the patient's safety. The patient's presenting symptoms, physical exam findings, and initial radiographic and laboratory data in the context of their chronic comorbidities is felt to place them at high risk for further clinical deterioration. Furthermore, it is not anticipated that the patient will be medically stable for discharge from the hospital within 2 midnights of admission. The following factors support the patient status of inpatient.   " The patient's presenting symptoms include Diarrhea. " The worrisome physical exam findings include mild abdominal tenderness. " The initial radiographic and laboratory data are worrisome because of Renal Insuffiencey  " The chronic co-morbidities are listed as above.  * I certify that at the point of admission it is my clinical judgment that the patient will require inpatient hospital care spanning beyond 2 midnights from the point of admission due to high intensity of service,  high risk for further deterioration and high frequency of surveillance required.Kerney Elbe, D.O. [facility] Pager [phone]  If 7PM-7AM, please contact night-coverage [email or web address] Password [facility]  06/19/2018, 7:28 PM

## 2018-06-19 NOTE — Progress Notes (Signed)
Attempted to call and get report from ED nurse to be within the 30 minute window, was placed on hold with no answer. Will reach out to ED again if no response or report called to this RN.

## 2018-06-19 NOTE — ED Provider Notes (Signed)
[facility] DEPT Provider Note   CSN: [id number] Arrival date & time: 06/19/18  1006     History   Chief Complaint Chief Complaint  Patient presents with  . Diarrhea    HPI COBIE MARCOUX is a 77 y.o. male.  HPI  77 year old male with history of small cell carcinoma of the lung, hypertension.  Patient is getting chemo and immunotherapy, with the first round started on Friday.  He is also supposed to be getting radiation treatment.  Patient brought into the ER with chief complaint of diarrhea.  Patient started having diarrhea on Saturday.  Initially patient was going to the bathroom every 30 minutes, yesterday he was started on Imodium and he still has been going to the bathroom once every hour.  Patient is also having nighttime symptoms.  Family also reports that patient has been acting a little bit more confused, at one point he wanted to sit on a dumpster thinking that the toilet and he has had spells of dizziness and a fall.  Family denies any bloody stools and patient has no fevers or chills.  Patient has had abdominal pain described as crampy pain.  Abdominal pain has been intermittent, when present it is moderately severe.  Patient has no history of C. difficile and has had no recent antibiotic exposure.  Past Medical History:  Diagnosis Date  . Cancer ([place])    small cell carcinoma lung  . Hypertension     Patient Active Problem List   Diagnosis Date Noted  . Goals of care, counseling/discussion 06/09/2018  . Encounter for antineoplastic chemotherapy 06/09/2018  . Encounter for antineoplastic immunotherapy 06/09/2018  . Malignant neoplasm of upper lobe of left lung ([place]) 06/08/2018  . Abscess, peritonsillar 02/12/2012  . Acute respiratory failure with hypoxia ([place]) 02/12/2012  . Hyperglycemia 02/12/2012  . ERECTILE DYSFUNCTION 12/10/2010  . HEADACHE 08/11/2010  . COLONIC POLYPS, ADENOMATOUS 05/18/2010  . ANEMIA, SECONDARY TO ACUTE  BLOOD LOSS 05/18/2010  . COLONIC POLYPS, ADENOMATOUS, HX OF 05/18/2010  . ERECTILE DYSFUNCTION, ORGANIC 05/06/2010  . HYPERLIPIDEMIA 02/18/2010  . ANXIETY 02/18/2010  . GLAUCOMA 02/18/2010  . Essential hypertension 02/18/2010    Past Surgical History:  Procedure Laterality Date  . COLONOSCOPY          Home Medications    Prior to Admission medications   Medication Sig Start Date End Date Taking? Authorizing Provider  aspirin EC 81 MG tablet Take 81 mg by mouth daily.   Yes [provider]  meclizine (ANTIVERT) 50 MG tablet Take 1 tablet (50 mg total) by mouth 3 (three) times daily as needed. Patient taking differently: Take 50 mg by mouth 3 (three) times daily as needed for dizziness.  12/19/15  Yes Mackuen, Courteney Lyn, MD  omeprazole (PRILOSEC OTC) 20 MG tablet Take 20 mg by mouth daily.   Yes [provider]  ondansetron (ZOFRAN) 8 MG tablet Take 1 tablet (8 mg total) by mouth every 8 (eight) hours as needed for nausea or vomiting. 06/08/18  Yes Hayden Pedro, PA-C  oxyCODONE-acetaminophen (PERCOCET/ROXICET) 5-325 MG tablet Take 1 tablet by mouth every 6 (six) hours as needed for severe pain. 05/31/18  Yes Curcio, Roselie Awkward, NP  prochlorperazine (COMPAZINE) 10 MG tablet Take 1 tablet (10 mg total) by mouth every 6 (six) hours as needed for nausea or vomiting. 06/09/18  Yes Curt Bears, MD  simvastatin (ZOCOR) 40 MG tablet Take 40 mg by mouth every evening.   Yes [provider]  traMADol (ULTRAM) 50 MG tablet Take 25-50 mg by mouth 3 (three) times daily as needed for moderate pain.  05/15/18  Yes [provider]  valsartan-hydrochlorothiazide (DIOVAN-HCT) 320-25 MG tablet Take 1 tablet by mouth daily. 12/05/15  Yes [provider]    Family History Family History  Problem Relation Age of Onset  . Mesothelioma Sister     Social History Social History   Tobacco Use  . Smoking status: Never Smoker  . Smokeless tobacco:  Never Used  Substance Use Topics  . Alcohol use: No  . Drug use: No     Allergies   Rosuvastatin   Review of Systems Review of Systems  Constitutional: Positive for activity change.  Gastrointestinal: Positive for diarrhea, nausea and vomiting.  Neurological: Positive for dizziness.  Psychiatric/Behavioral: Positive for confusion.  All other systems reviewed and are negative.    Physical Exam Updated Vital Signs BP 108/61 (BP Location: Left Arm) Comment: Simultaneous filing. User may not have seen previous data.  Pulse 98 Comment: Simultaneous filing. User may not have seen previous data.  Temp 98.5 F (36.9 C) (Oral)   Resp 18   SpO2 98% Comment: Simultaneous filing. User may not have seen previous data.  Physical Exam  Constitutional: He appears well-developed.  HENT:  Head: Atraumatic.  Dry tongue  Neck: Neck supple.  Cardiovascular: Normal rate.  Pulmonary/Chest: Effort normal.  Abdominal: Soft. There is no tenderness. There is no guarding.  Neurological: He is alert.  Skin: Skin is warm.  Nursing note and vitals reviewed.    ED Treatments / Results  Labs (all labs ordered are listed, but only abnormal results are displayed) Labs Reviewed  COMPREHENSIVE METABOLIC PANEL - Abnormal; Notable for the following components:      Result Value   CO2 21 (*)    Glucose, Bld 119 (*)    BUN 95 (*)    Creatinine, Ser 2.36 (*)    Albumin 2.4 (*)    ALT 69 (*)    Alkaline Phosphatase 184 (*)    GFR calc non Af Amer 25 (*)    GFR calc Af Amer 29 (*)    All other components within normal limits  CBC - Abnormal; Notable for the following components:   RBC 4.21 (*)    Hemoglobin 9.9 (*)    HCT 31.0 (*)    MCV 73.6 (*)    MCH 23.5 (*)    RDW 16.5 (*)    All other components within normal limits  MAGNESIUM - Abnormal; Notable for the following components:   Magnesium 2.7 (*)    All other components within normal limits  LIPASE, BLOOD  URINALYSIS, ROUTINE W  REFLEX MICROSCOPIC    EKG None  Radiology No results found.  Procedures Procedures (including critical care time)  Medications Ordered in ED Medications  lactated ringers bolus 1,000 mL (has no administration in time range)  lactated ringers bolus 1,000 mL (has no administration in time range)  predniSONE (DELTASONE) tablet 60 mg (has no administration in time range)     Initial Impression / Assessment and Plan / ED Course  I have reviewed the triage vital signs and the nursing notes.  Pertinent labs & imaging results that were available during my care of the patient were reviewed by me and considered in my medical decision making (see chart for details).  Clinical Course as of Jun 19 1212  Mon Jun 19, 2018  1212 Upon attempt to fit orthostatics, when patient got  up he got extremely dizzy and had to be set up.   [AN]    Clinical Course User Index [AN] Varney Biles, MD   77 year old male comes in with chief complaint of diarrhea.  Patient also has had associated crampy pain.  With his history of small cell carcinoma of the lung patient was started on immunotherapy and chemotherapy on Friday.  His diarrhea started on Saturday.  His colitis could be because of chemo or immunotherapy.  I called Dr. Julien Nordmann, patient's oncologist, and with patient having more than 5 episodes of diarrhea he would like [place] to start him on 1 mg/kg prednisone orally at this time.  Patient had a benign abdominal exam for me.  He has had intermittent crampy abdominal pain, but with a normal white count and no fevers, I doubt that a CT scan is indicated at this time.  The hospitalist and I discussed the need for CT scan, they will monitor the patient closely and get a CT scan if necessary.  Finally, labs show that patient has uremia.  His electrolytes otherwise look fine.  Creatinine is at its baseline level.  Family reports that patient has not been eating or drinking well, which could have already started  the prerenal acute on chronic renal failure, with uremia exacerbated because of the diarrhea.  Patient has had episodes of confusion -which could be because of the uremia.  2 L of LR ordered.  Magnesium has been ordered as well.   Final Clinical Impressions(s) / ED Diagnoses   Final diagnoses:  Uremia  Severe dehydration  Colitis  Orthostatic hypotension    ED Discharge Orders    None       Varney Biles, MD 06/19/18 1213

## 2018-06-19 NOTE — Telephone Encounter (Signed)
Per Julien Nordmann I instructed Cheri to take pt to ED or call 911 to take pt. She said her son is there , pt is cooperative and they will take him in the car.

## 2018-06-19 NOTE — ED Notes (Signed)
Urinal at bedside- will let us know when he can give a specimen

## 2018-06-19 NOTE — ED Notes (Signed)
ED TO INPATIENT HANDOFF REPORT  Name/Age/Gender Zachary Hobbs 77 y.o. male  Code Status Code Status History    Date Active Date Inactive Code Status Order ID Comments User Context   02/12/2012 1304 02/15/2012 1501 Full Code [id number]  AmehShon Baton, RN Inpatient      Home/SNF/Other Home  Chief Complaint Diarrhea  Level of Care/Admitting Diagnosis ED Disposition    ED Disposition Condition [facility]: [facility] [[id number]]  Level of Care: Med-Surg [16]  Diagnosis: Diarrhea [787.91.ICD-9-CM]  Admitting Physician: Raiford Noble LATIF [[id number]]  Attending Physician: Raiford Noble LATIF [[id number]]  Estimated length of stay: past midnight tomorrow  Certification:: I certify this patient will need inpatient services for at least 2 midnights  PT Class (Do Not Modify): Inpatient [101]  PT Acc Code (Do Not Modify): Private [1]       Medical History Past Medical History:  Diagnosis Date  . Cancer ([place])    small cell carcinoma lung  . Hypertension     Allergies Allergies  Allergen Reactions  . Rosuvastatin     REACTION: myalgia    IV Location/Drains/Wounds Patient Lines/Drains/Airways Status   Active Line/Drains/Airways    Name:   Placement date:   Placement time:   Site:   Days:   Peripheral IV 06/19/18 Right Wrist   06/19/18    1032    Wrist   less than 1   Incision (Closed) 06/02/18 Chest Left;Anterior;Mid   06/02/18    1230     17          Labs/Imaging Results for orders placed or performed during the hospital encounter of 06/19/18 (from the past 48 hour(s))  Lipase, blood     Status: None   Collection Time: 06/19/18 10:14 AM  Result Value Ref Range   Lipase 20 11 - 51 U/L    Comment: Performed at [facility], [place] [address] [place], [place] [address]  Comprehensive metabolic panel     Status: Abnormal   Collection Time: 06/19/18 10:14 AM  Result Value Ref Range   Sodium 143 135 - 145  mmol/L   Potassium 4.5 3.5 - 5.1 mmol/L   Chloride 110 98 - 111 mmol/L   CO2 21 (L) 22 - 32 mmol/L   Glucose, Bld 119 (H) 70 - 99 mg/dL   BUN 95 (H) 8 - 23 mg/dL   Creatinine, Ser 2.36 (H) 0.61 - 1.24 mg/dL   Calcium 8.9 8.9 - 10.3 mg/dL   Total Protein 7.2 6.5 - 8.1 g/dL   Albumin 2.4 (L) 3.5 - 5.0 g/dL   AST 30 15 - 41 U/L   ALT 69 (H) 0 - 44 U/L   Alkaline Phosphatase 184 (H) 38 - 126 U/L   Total Bilirubin 1.2 0.3 - 1.2 mg/dL   GFR calc non Af Amer 25 (L) >60 mL/min   GFR calc Af Amer 29 (L) >60 mL/min    Comment: (NOTE) The eGFR has been calculated using the CKD EPI equation. This calculation has not been validated in all clinical situations. eGFR's persistently <60 mL/min signify possible Chronic Kidney Disease.    Anion gap 12 5 - 15    Comment: Performed at [facility], [place] [address] [place], [place] [address]  CBC     Status: Abnormal   Collection Time: 06/19/18 10:14 AM  Result Value Ref Range   WBC 8.6 4.0 - 10.5 K/uL   RBC 4.21 (L)  4.22 - 5.81 MIL/uL   Hemoglobin 9.9 (L) 13.0 - 17.0 g/dL   HCT 31.0 (L) 39.0 - 52.0 %   MCV 73.6 (L) 78.0 - 100.0 fL   MCH 23.5 (L) 26.0 - 34.0 pg   MCHC 31.9 30.0 - 36.0 g/dL   RDW 16.5 (H) 11.5 - 15.5 %   Platelets 285 150 - 400 K/uL    Comment: Performed at [facility], [place] [address], [place], [place] [address]  Magnesium     Status: Abnormal   Collection Time: 06/19/18 10:14 AM  Result Value Ref Range   Magnesium 2.7 (H) 1.7 - 2.4 mg/dL    Comment: Performed at [facility], [place] [address] [place], [place] [address]   No results found.  Pending Labs Unresulted Labs (From admission, onward)    Start     Ordered   06/19/18 1014  Urinalysis, Routine w reflex microscopic  STAT,   STAT     06/19/18 1013   Signed and Held  Magnesium  Tomorrow morning,   R     Signed and Held   Signed and Held  Phosphorus  Tomorrow morning,   R     Signed and Held   Signed and Held   TSH  Tomorrow morning,   R     Signed and Held   Signed and Held  Occult blood card to lab, stool  Once,   R     Signed and Held   Signed and Held  Comprehensive metabolic panel  Tomorrow morning,   R     Signed and Held   Signed and Held  CBC  Tomorrow morning,   R     Signed and Held          Vitals/Pain Today's Vitals   06/19/18 1016 06/19/18 1130 06/19/18 1230 06/19/18 1235  BP: 100/61 108/61 110/66 110/66  Pulse: (!) 123 98 95 95  Resp: _0 Temp: 98.5 F (36.9 C)     TempSrc: Oral     SpO2: 98% 98% 96% 96%    Isolation Precautions No active isolations  Medications Medications  lactated ringers bolus 1,000 mL (1,000 mLs Intravenous New Bag/Given 06/19/18 1219)  lactated ringers bolus 1,000 mL (1,000 mLs Intravenous New Bag/Given 06/19/18 1219)  predniSONE (DELTASONE) tablet 65 mg (has no administration in time range)  predniSONE (DELTASONE) tablet 60 mg (60 mg Oral Given 06/19/18 1218)    Mobility walks

## 2018-06-19 NOTE — Telephone Encounter (Signed)
Received tc from patient's wife @8 :45 am. She states that Mr. Isenberg was did not reive injection on Saturday as scheduled as it was too soon after his chemo on Friday. She was told to come in today for the injection.  Wife states that pt is very weak and unsteady this am, having diarrhea every hour throughout the weekend. He is not eating, taking only sips of water.  She also states he is hallucinating  "talking crazy".  Denies fever or chills, passing urine ok though it is darker than normal.  She is asking what she should do.  Their son is there with them at this time.  Will pass information to Dr. Worthy Flank nurse. Advised pt's wife to expect a call back this am.

## 2018-06-19 NOTE — ED Triage Notes (Signed)
Pt had chemo on Friday and since Saturday been having diarrhea. Family stated that they were instructed by cancer center to bring pt to ED.

## 2018-06-20 ENCOUNTER — Ambulatory Visit
Admission: RE | Admit: 2018-06-20 | Discharge: 2018-06-20 | Disposition: A | Discharge status: Home / Self Care | Payer: 59 | Source: Ambulatory Visit | Attending: Radiation Oncology | Admitting: Radiation Oncology

## 2018-06-20 LAB — COMPREHENSIVE METABOLIC PANEL
ALT: 51 U/L — ABNORMAL HIGH (ref 0–44)
AST: 24 U/L (ref 15–41)
Albumin: 2 g/dL — ABNORMAL LOW (ref 3.5–5.0)
Alkaline Phosphatase: 143 U/L — ABNORMAL HIGH (ref 38–126)
Anion gap: 10 (ref 5–15)
BUN: 79 mg/dL — ABNORMAL HIGH (ref 8–23)
CALCIUM: 7.8 mg/dL — AB (ref 8.9–10.3)
CHLORIDE: 110 mmol/L (ref 98–111)
CO2: 22 mmol/L (ref 22–32)
Creatinine, Ser: 2.01 mg/dL — ABNORMAL HIGH (ref 0.61–1.24)
GFR calc non Af Amer: 31 mL/min — ABNORMAL LOW (ref >60)
GFR, EST AFRICAN AMERICAN: 36 mL/min — AB (ref >60)
Glucose, Bld: 132 mg/dL — ABNORMAL HIGH (ref 70–99)
Potassium: 4.5 mmol/L (ref 3.5–5.1)
SODIUM: 142 mmol/L (ref 135–145)
Total Bilirubin: 0.8 mg/dL (ref 0.3–1.2)
Total Protein: 6.1 g/dL — ABNORMAL LOW (ref 6.5–8.1)

## 2018-06-20 LAB — CBC
HCT: 27.1 % — ABNORMAL LOW (ref 39.0–52.0)
Hemoglobin: 8.6 g/dL — ABNORMAL LOW (ref 13.0–17.0)
MCH: 23.3 pg — AB (ref 26.0–34.0)
MCHC: 31.7 g/dL (ref 30.0–36.0)
MCV: 73.4 fL — AB (ref 78.0–100.0)
Platelets: 246 10*3/uL (ref 150–400)
RBC: 3.69 MIL/uL — AB (ref 4.22–5.81)
RDW: 16.4 % — ABNORMAL HIGH (ref 11.5–15.5)
WBC: 6 10*3/uL (ref 4.0–10.5)

## 2018-06-20 LAB — TSH: TSH: 0.573 u[IU]/mL (ref 0.350–4.500)

## 2018-06-20 LAB — MAGNESIUM: MAGNESIUM: 2.5 mg/dL — AB (ref 1.7–2.4)

## 2018-06-20 LAB — URINE CULTURE: Culture: NO GROWTH

## 2018-06-20 LAB — PHOSPHORUS: Phosphorus: 3.4 mg/dL (ref 2.5–4.6)

## 2018-06-20 LAB — GLUCOSE, CAPILLARY: GLUCOSE-CAPILLARY: 122 mg/dL — AB (ref 70–99)

## 2018-06-20 MED ORDER — SODIUM CHLORIDE 0.9 % IV SOLN
INTRAVENOUS | Status: DC
  Administered 2018-06-21: 20:00:00 via INTRAVENOUS
  Administered 2018-06-22: 1000 mL via INTRAVENOUS
  Administered 2018-06-22 – 2018-06-24 (×5): via INTRAVENOUS

## 2018-06-20 NOTE — Progress Notes (Signed)
[facility] Progress Note  Patient: Zachary Hobbs:[id number]   PCP: Janie Morning, DO DOB: [birth year]   DOA: 06/19/2018   DOS: 06/20/2018   Date of Service: the patient was seen and examined on 06/20/2018  Subjective: feeling better, pt has been acting confused lately per family. No acute complains  Brief hospital course: Pt. with PMH of metastatic small cell lung cancer, hypertension, CKD stage III, GERD, hyperlipidemia, anemia; admitted on 06/19/2018, presented with complaint of diarrhea, was found to have aki . Currently further plan is continue IV hydration .  Assessment and Plan: Significant Diarrhea with Abdominal Tenderness r/o Colitis and suspect Immune Mediated/Chemotherapy mediated -Likely in the Setting of Chemotherapy and Immuno thearpy and ? Immune Mediated Colitis  -CT of Abd and Pelvis suggest formed stool and no colitis  -On IVF for Supportive Care -Not febrile and has no Leukocytosis so less likley infectious -C Difficile lower on Differential but will check it.  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 Julien Nordmann and recently had chemotherapy and Immunotherapy -Start Empiric Steroids for possible Immune mediated colitis -Dr. Julien Nordmann Notified and will see the patient   On radiation   HTN -Hold Valsartan-Hydrochlorothiazide given his diarrhea and lower blood pressures  GERD -Continue with pantoprazole 40 mill grams p.o. daily  HLD -Continue with Simvastatin 40 g p.o. nightly  Microcytic Anemia -Hemoglobin/hematocrit appears stable -In the setting of likely chemotherapy and immunotherapy -Continue monitor for signs and symptoms of bleeding -Repeat CBC in a.m.  AKI on CKD stage 3 -BUN/creatinine was 95/2.36 and patient was slightly uremic with mild confusion -Cr seems worse in the last month as last BUN/creatinine last month was 40/1.94 -Avoid nephrotoxic medications and hold  losartan-hydrochlorothiazide currently -Continue monitor and trend renal function -Continue with IV fluid hydration as above  Hypermagnesemia -Patient magnesium level 2.7 -Continue to monitor and repeat magnesium level in a.m.  Elevated AST -Likely in the setting of chemotherapy -Continue monitor trend -Repeat CMP in the a.m.  Acute metabolic encephalopathy  -The setting of diarrhea and dehydration -Continue IV fluid hydration -May need to pursue head imaging if does not improve  Diet: soft diet DVT Prophylaxis: subcutaneous Heparin  Advance goals of care discussion: full code  Family Communication: family was present at bedside, at the time of interview. The pt provided permission to discuss medical plan with the family. Opportunity was given to ask question and all questions were answered satisfactorily.   Disposition:  Discharge to be determined PTOT.  Consultants: noen Procedures: none  Antibiotics: Anti-infectives (From admission, onward)   None       Objective: Physical Exam: Vitals:   06/19/18 1407 06/19/18 2023 06/20/18 0457 06/20/18 0604  BP: 115/64 111/63 110/67   Pulse: 87 82 78   Resp: 14 18 18    Temp: 97.6 F (36.4 C) 99.2 F (37.3 C) 97.9 F (36.6 C)   TempSrc: Oral Oral    SpO2: 100% 96% 97%   Weight:    62.1 kg    Intake/Output Summary (Last 24 hours) at 06/20/2018 1734 Last data filed at 06/20/2018 1617 Gross per 24 hour  Intake 403.5 ml  Output -  Net 403.5 ml   Filed Weights   06/20/18 0604  Weight: 62.1 kg   General: Alert, Awake and Oriented to Time, Place and Person. Appear in moderate distress, affect appropriate but talks tangentially  Eyes: PERRL, Conjunctiva normal ENT: Oral Mucosa clear  moisty. eck: no JVD, no Abnormal Mass Or lumps Cardiovascular: S1 and S2 Present, no Murmur, Peripheral Pulses Present Respiratory: normal respiratory effort, Bilateral Air entry equal and Decreased, no use of accessory muscle, Clear to  Auscultation, no Crackles, no wheezes Abdomen: Bowel Sound present, Soft and no tenderness, no hernia Skin: no redness, no Rash, no induration Extremities: no Pedal edema, no calf tenderness Neurologic: Grossly no focal neuro deficit. Bilaterally Equal motor strength  Data Reviewed: CBC: Recent Labs  Lab 06/15/18 1310 06/19/18 1014 06/20/18 0614  WBC 9.7 8.6 6.0  NEUTROABS 7.8*  --   --   HGB 9.8* 9.9* 8.6*  HCT 30.7* 31.0* 27.1*  MCV 74.0* 73.6* 73.4*  PLT 400 285 098   Basic Metabolic Panel: Recent Labs  Lab 06/15/18 1310 06/19/18 1014 06/20/18 0614  NA 139 143 142  K 5.2* 4.5 4.5  CL 103 110 110  CO2 19* 21* 22  GLUCOSE 94 119* 132*  BUN 110* 95* 79*  CREATININE 2.25* 2.36* 2.01*  CALCIUM 11.3* 8.9 7.8*  MG  --  2.7* 2.5*  PHOS  --   --  3.4    Liver Function Tests: Recent Labs  Lab 06/15/18 1310 06/19/18 1014 06/20/18 0614  AST 26 30 24   ALT 57* 69* 51*  ALKPHOS 198* 184* 143*  BILITOT 1.0 1.2 0.8  PROT 8.2* 7.2 6.1*  ALBUMIN 2.3* 2.4* 2.0*   Recent Labs  Lab 06/19/18 1014  LIPASE 20   No results for input(s): AMMONIA in the last 168 hours. Coagulation Profile: No results for input(s): INR, PROTIME in the last 168 hours. Cardiac Enzymes: No results for input(s): CKTOTAL, CKMB, CKMBINDEX, TROPONINI in the last 168 hours. BNP (last 3 results) No results for input(s): PROBNP in the last 8760 hours. CBG: Recent Labs  Lab 06/20/18 0500  GLUCAP 122*   Studies: No results found.  Scheduled Meds: . pantoprazole  40 mg Oral Daily  . predniSONE  65 mg Oral Q breakfast  . simvastatin  40 mg Oral QPM   Continuous Infusions: . sodium chloride 100 mL/hr at 06/20/18 1617   PRN Meds: acetaminophen **OR** acetaminophen, alum & mag hydroxide-simeth, meclizine, ondansetron **OR** ondansetron (ZOFRAN) IV, oxyCODONE-acetaminophen, traMADol  Time spent: 35 minutes  Author: Berle Mull, MD [facility] Pager: [phone] 06/20/2018 5:34  PM  If 7PM-7AM, please contact night-coverage at [email or web address], password [facility]

## 2018-06-20 NOTE — Progress Notes (Signed)
Pt has reported several instances of needing the bedpan for a BM but will only pass gas or a smear of brownish- red stool

## 2018-06-21 ENCOUNTER — Ambulatory Visit
Admission: RE | Admit: 2018-06-21 | Discharge: 2018-06-21 | Disposition: A | Discharge status: Home / Self Care | Payer: 59 | Source: Ambulatory Visit | Attending: Radiation Oncology | Admitting: Radiation Oncology

## 2018-06-21 ENCOUNTER — Ambulatory Visit (HOSPITAL_COMMUNITY)
Admit: 2018-06-21 | Discharge: 2018-06-21 | Disposition: A | Discharge status: Home / Self Care | Payer: 59 | Attending: Radiation Oncology | Admitting: Radiation Oncology

## 2018-06-21 DIAGNOSIS — K5900 Constipation, unspecified: Secondary | ICD-10-CM | POA: Diagnosis present

## 2018-06-21 DIAGNOSIS — C7949 Secondary malignant neoplasm of other parts of nervous system: Principal | ICD-10-CM

## 2018-06-21 DIAGNOSIS — C3490 Malignant neoplasm of unspecified part of unspecified bronchus or lung: Secondary | ICD-10-CM | POA: Diagnosis not present

## 2018-06-21 DIAGNOSIS — K922 Gastrointestinal hemorrhage, unspecified: Secondary | ICD-10-CM | POA: Diagnosis present

## 2018-06-21 DIAGNOSIS — G9340 Encephalopathy, unspecified: Secondary | ICD-10-CM

## 2018-06-21 DIAGNOSIS — R1084 Generalized abdominal pain: Secondary | ICD-10-CM

## 2018-06-21 DIAGNOSIS — C7931 Secondary malignant neoplasm of brain: Secondary | ICD-10-CM

## 2018-06-21 LAB — FERRITIN: Ferritin: 2231 ng/mL — ABNORMAL HIGH (ref 24–336)

## 2018-06-21 LAB — CBC WITH DIFFERENTIAL/PLATELET
BASOS PCT: 0 %
Basophils Absolute: 0 10*3/uL (ref 0.0–0.1)
EOS ABS: 0 10*3/uL (ref 0.0–0.7)
Eosinophils Relative: 0 %
HCT: 27.6 % — ABNORMAL LOW (ref 39.0–52.0)
HEMOGLOBIN: 8.7 g/dL — AB (ref 13.0–17.0)
Lymphocytes Relative: 13 %
Lymphs Abs: 0.6 10*3/uL — ABNORMAL LOW (ref 0.7–4.0)
MCH: 23.3 pg — ABNORMAL LOW (ref 26.0–34.0)
MCHC: 31.5 g/dL (ref 30.0–36.0)
MCV: 74 fL — ABNORMAL LOW (ref 78.0–100.0)
MONOS PCT: 1 %
Monocytes Absolute: 0.1 10*3/uL (ref 0.1–1.0)
NEUTROS PCT: 86 %
Neutro Abs: 3.9 10*3/uL (ref 1.7–7.7)
Platelets: 218 10*3/uL (ref 150–400)
RBC: 3.73 MIL/uL — ABNORMAL LOW (ref 4.22–5.81)
RDW: 16.4 % — ABNORMAL HIGH (ref 11.5–15.5)
WBC: 4.6 10*3/uL (ref 4.0–10.5)

## 2018-06-21 LAB — COMPREHENSIVE METABOLIC PANEL
ALBUMIN: 1.9 g/dL — AB (ref 3.5–5.0)
ALK PHOS: 132 U/L — AB (ref 38–126)
ALT: 42 U/L (ref 0–44)
ANION GAP: 10 (ref 5–15)
AST: 21 U/L (ref 15–41)
BUN: 60 mg/dL — ABNORMAL HIGH (ref 8–23)
CHLORIDE: 114 mmol/L — AB (ref 98–111)
CO2: 19 mmol/L — AB (ref 22–32)
Calcium: 7.5 mg/dL — ABNORMAL LOW (ref 8.9–10.3)
Creatinine, Ser: 1.8 mg/dL — ABNORMAL HIGH (ref 0.61–1.24)
GFR calc Af Amer: 41 mL/min — ABNORMAL LOW (ref >60)
GFR calc non Af Amer: 35 mL/min — ABNORMAL LOW (ref >60)
GLUCOSE: 104 mg/dL — AB (ref 70–99)
POTASSIUM: 4.5 mmol/L (ref 3.5–5.1)
SODIUM: 143 mmol/L (ref 135–145)
Total Bilirubin: 1.1 mg/dL (ref 0.3–1.2)
Total Protein: 6 g/dL — ABNORMAL LOW (ref 6.5–8.1)

## 2018-06-21 LAB — VITAMIN B12: VITAMIN B 12: 1515 pg/mL — AB (ref 180–914)

## 2018-06-21 LAB — FOLATE: FOLATE: 4.9 ng/mL — AB (ref >5.9)

## 2018-06-21 LAB — OCCULT BLOOD X 1 CARD TO LAB, STOOL: Fecal Occult Bld: POSITIVE — AB

## 2018-06-21 LAB — IRON AND TIBC
Iron: 91 ug/dL (ref 45–182)
SATURATION RATIOS: 62 % — AB (ref 17.9–39.5)
TIBC: 148 ug/dL — AB (ref 250–450)
UIBC: 57 ug/dL

## 2018-06-21 LAB — RETICULOCYTES
RBC.: 3.76 MIL/uL — ABNORMAL LOW (ref 4.22–5.81)
Retic Ct Pct: <0.4 % — ABNORMAL LOW (ref 0.4–3.1)

## 2018-06-21 LAB — MAGNESIUM: Magnesium: 2.5 mg/dL — ABNORMAL HIGH (ref 1.7–2.4)

## 2018-06-21 LAB — GLUCOSE, CAPILLARY: Glucose-Capillary: 88 mg/dL (ref 70–99)

## 2018-06-21 MED ORDER — BISACODYL 10 MG RE SUPP
10.0000 mg | Freq: Once | RECTAL | Status: DC
  Filled 2018-06-21: qty 1

## 2018-06-21 MED ORDER — LEVETIRACETAM 500 MG PO TABS
500.0000 mg | ORAL_TABLET | Freq: Two times a day (BID) | ORAL | Status: DC
  Administered 2018-06-21 – 2018-06-26 (×10): 500 mg via ORAL
  Filled 2018-06-21 (×10): qty 1

## 2018-06-21 MED ORDER — METHYLPREDNISOLONE SODIUM SUCC 40 MG IJ SOLR
20.0000 mg | Freq: Two times a day (BID) | INTRAMUSCULAR | Status: DC
  Administered 2018-06-21 – 2018-06-24 (×6): 20 mg via INTRAVENOUS
  Filled 2018-06-21 (×6): qty 1

## 2018-06-21 MED ORDER — GADOBENATE DIMEGLUMINE 529 MG/ML IV SOLN
15.0000 mL | Freq: Once | INTRAVENOUS | Status: AC | PRN
  Administered 2018-06-21: 15 mL via INTRAVENOUS

## 2018-06-21 NOTE — Consult Note (Addendum)
Consultation  Referring Provider: Dr. Grandville Silos     Primary Care Physician:  Janie Morning, DO Primary Gastroenterologist: Dr. Carlean Purl       Reason for Consultation: GI bleed, diarrhea            HPI:   Zachary Hobbs is a 77 y.o. male with a past medical history as listed below including metastatic small cell carcinoma of the lung, CKD stage III, GERD, hyperlipidemia, anemia and others, who presented to the ER on 06/19/2018 with a complaint of diarrhea.    Today, patient is an unreliable historian and much of his history is garnered from his wife who comes in the room during exam, explains that on Friday he had a chemotherapy and immunotherapy with Beryle Flock and a couple other agents, this was his first dose, and then on Saturday morning he started having some intermittent abdominal pain that was diffuse and crampy with significant loose watery stools every 30-60 min, that have improved slightly to a more "mush" since admission and have also slowed per patient's wife.  Their concern today is that with his bowel movements yesterday he started to see some bright red blood mixed in.  Associated symptoms include some dizziness and one fall.  Apparently discussion with oncologist who felt this may be an immune mediated reaction.    Denies fever, chills, weight loss, anorexia, nausea, vomiting or symptoms that awaken him from sleep.  Per nursing staff patient was observed to have a bright red bloody stool today by wound care team as well as some bright red blood when they were cleaning him.  ED work-up:  AKI  Previous GI history: 05/18/2010-office visit Dr. Carlean Purl: Apparently patient had a post polypectomy bleed, that time is put in recall for repeat colonoscopy in November due to residual cecal polyp 03/19/2010-colonoscopy, Dr. Carlean Purl: 2 to 3 cm sessile polyp in the cecum, partially removed, 2 mm diminutive polyp in the cecum, 5 polyps in the left colon otherwise normal exam, it was discussed that  repeat colonoscopy polypectomy with APC available for surgical resection of residual cecal polyp would be needed; pathology showed tubular adenoma and multiple fragments of tubulovillous adenoma  Past Medical History:  Diagnosis Date  . Cancer ([place])    small cell carcinoma lung  . Hypertension     Past Surgical History:  Procedure Laterality Date  . COLONOSCOPY      Family History  Problem Relation Age of Onset  . Mesothelioma Sister     Social History   Tobacco Use  . Smoking status: Never Smoker  . Smokeless tobacco: Never Used  Substance Use Topics  . Alcohol use: No  . Drug use: No    Prior to Admission medications   Medication Sig Start Date End Date Taking? Authorizing Provider  aspirin EC 81 MG tablet Take 81 mg by mouth daily.   Yes [provider]  meclizine (ANTIVERT) 50 MG tablet Take 1 tablet (50 mg total) by mouth 3 (three) times daily as needed. Patient taking differently: Take 50 mg by mouth 3 (three) times daily as needed for dizziness.  12/19/15  Yes Mackuen, Courteney Lyn, MD  omeprazole (PRILOSEC OTC) 20 MG tablet Take 20 mg by mouth daily.   Yes [provider]  ondansetron (ZOFRAN) 8 MG tablet Take 1 tablet (8 mg total) by mouth every 8 (eight) hours as needed for nausea or vomiting. 06/08/18  Yes Hayden Pedro, PA-C  oxyCODONE-acetaminophen (PERCOCET/ROXICET) 5-325 MG tablet Take 1  tablet by mouth every 6 (six) hours as needed for severe pain. 05/31/18  Yes Curcio, Roselie Awkward, NP  prochlorperazine (COMPAZINE) 10 MG tablet Take 1 tablet (10 mg total) by mouth every 6 (six) hours as needed for nausea or vomiting. 06/09/18  Yes Curt Bears, MD  simvastatin (ZOCOR) 40 MG tablet Take 40 mg by mouth every evening.   Yes [provider]  traMADol (ULTRAM) 50 MG tablet Take 25-50 mg by mouth 3 (three) times daily as needed for moderate pain.  05/15/18  Yes [provider]  valsartan-hydrochlorothiazide (DIOVAN-HCT) 320-25  MG tablet Take 1 tablet by mouth daily. 12/05/15  Yes [provider]    Current Facility-Administered Medications  Medication Dose Route Frequency Provider Last Rate Last Dose  . 0.9 %  sodium chloride infusion   Intravenous Continuous Eugenie Filler, MD 100 mL/hr at 06/20/18 1617    . acetaminophen (TYLENOL) tablet 650 mg  650 mg Oral Q6H PRN Raiford Noble Latif, DO       Or  . acetaminophen (TYLENOL) suppository 650 mg  650 mg Rectal Q6H PRN Raiford Noble Latif, DO      . alum & mag hydroxide-simeth (MAALOX/MYLANTA) 200-200-20 MG/5ML suspension 30 mL  30 mL Oral Q4H PRN Raiford Noble [place], DO   30 mL at 06/19/18 2111  . bisacodyl (DULCOLAX) suppository 10 mg  10 mg Rectal Once Eugenie Filler, MD      . meclizine (ANTIVERT) tablet 50 mg  50 mg Oral TID PRN Raiford Noble Latif, DO      . ondansetron [facility]) tablet 4 mg  4 mg Oral Q6H PRN Raiford Noble Latif, DO       Or  . ondansetron [facility]) injection 4 mg  4 mg Intravenous Q6H PRN Raiford Noble [place], DO   4 mg at 06/20/18 0454  . oxyCODONE-acetaminophen (PERCOCET/ROXICET) 5-325 MG per tablet 1 tablet  1 tablet Oral Q6H PRN Raiford Noble [place], DO   1 tablet at 06/20/18 4268  . pantoprazole (PROTONIX) EC tablet 40 mg  40 mg Oral Daily Raiford Noble [place], DO   40 mg at 06/21/18 0805  . predniSONE (DELTASONE) tablet 65 mg  65 mg Oral Q breakfast Raiford Noble [place], DO   65 mg at 06/21/18 0805  . simvastatin (ZOCOR) tablet 40 mg  40 mg Oral QPM SheikhGeorgina Quint [place], DO   40 mg at 06/20/18 1816  . traMADol (ULTRAM) tablet 25-50 mg  25-50 mg Oral Q12H PRN Raiford Noble [place], DO        Allergies as of 06/19/2018 - Review Complete 06/19/2018  Allergen Reaction Noted  . Rosuvastatin  04/02/2010     Review of Systems:    Constitutional: No weight loss, fever or chills Skin: No rash Cardiovascular: No chest pain Respiratory: No SOB  Gastrointestinal: See HPI and otherwise negative Genitourinary: No dysuria    Neurological: No headache Musculoskeletal: No new muscle or joint pain Hematologic: No bruising Psychiatric: No history of depression or anxiety    Physical Exam:  Vital signs in last 24 hours: Temp:  [97.4 F (36.3 C)-98.2 F (36.8 C)] 97.4 F (36.3 C) (08/21 0858) Pulse Rate:  [80-90] 86 (08/21 0858) Resp:  [16-20] 16 (08/21 0858) BP: (116-135)/(65-77) 135/71 (08/21 0858) SpO2:  [98 %] 98 % (08/21 0858) Last BM Date: 06/21/18 General:   Pleasant AA male appears to be in NAD, Well developed, Well nourished, alert and cooperative Head:  Normocephalic and atraumatic. Eyes:   PEERL, EOMI.  No icterus. Conjunctiva pink. Ears:  Normal auditory acuity. Neck:  Supple Throat: Oral cavity and pharynx without inflammation, swelling or lesion.  Lungs: Respirations even and unlabored. Lungs clear to auscultation bilaterally.   No wheezes, crackles, or rhonchi.  Heart: Normal S1, S2. No MRG. Regular rate and rhythm. No peripheral edema, cyanosis or pallor.  Abdomen:  Soft, nondistended, nontender. No rebound or guarding. Normal bowel sounds. No appreciable masses or hepatomegaly. Rectal:  Not performed at time of my exam as patient was busy eating lunch Msk:  Symmetrical without gross deformities. Peripheral pulses intact.  Extremities:  Without edema, no deformity or joint abnormality.  Neurologic:  Alert and  oriented x2 (persone and place);  grossly normal neurologically.  Skin:   Dry and intact without significant lesions or rashes. Psychiatric: Demonstrates good judgement and reason without abnormal affect or behaviors.   LAB RESULTS: Recent Labs    06/19/18 1014 06/20/18 0614 06/21/18 0634  WBC 8.6 6.0 4.6  HGB 9.9* 8.6* 8.7*  HCT 31.0* 27.1* 27.6*  PLT 285 246 218   BMET Recent Labs    06/19/18 1014 06/20/18 0614 06/21/18 0634  NA 143 142 143  K 4.5 4.5 4.5  CL 110 110 114*  CO2 21* 22 19*  GLUCOSE 119* 132* 104*  BUN 95* 79* 60*  CREATININE 2.36* 2.01* 1.80*   CALCIUM 8.9 7.8* 7.5*   LFT Recent Labs    06/21/18 0634  PROT 6.0*  ALBUMIN 1.9*  AST 21  ALT 42  ALKPHOS 132*  BILITOT 1.1   STUDIES: Ct Abdomen Pelvis Wo Contrast  Result Date: 06/19/2018 CLINICAL DATA:  Small cell carcinoma of the lung, inpatient for chemotherapy in immunotherapy, abdominal pain EXAM: CT ABDOMEN AND PELVIS WITHOUT CONTRAST TECHNIQUE: Multidetector CT imaging of the abdomen and pelvis was performed following the standard protocol without IV contrast. COMPARISON:  Multiple exams, including PET-CT from 05/29/2018 FINDINGS: Lower chest: Numerous metastatic lesions in the lung bases. Index lesion in the left lower lobe posterior basal segment measures 3.0 by 2.2 cm, formerly 2.9 by 2.2 cm. Increased pleural thickening at the left lung base posteriorly, possibly from pleural tumor. Hepatobiliary: Accentuated density in the gallbladder possibly from sludge. Low sensitivity for early metastatic disease to the liver due to lack of IV contrast. Pancreas: Unremarkable Spleen: Unremarkable Adrenals/Urinary Tract: Previously hypermetabolic right adrenal mass 1.6 cm transverse, previously 1.5 cm. Previously hypermetabolic left adrenal mass 2.6 by 2.1 cm on image 18/2, formerly 1.8 by 2.5 cm. No urinary tract calculi observed. The renal contours appear normal. Stomach/Bowel: Small amount of retained contrast medium in the colon. No dilated bowel. There is some formed stool in the distal colon. No significant degree of bowel wall thickening identified. Vascular/Lymphatic: Aortoiliac atherosclerotic vascular disease. No pathologic adenopathy identified. Reproductive: Unremarkable Other: New presacral edema. Musculoskeletal: The patient has known widespread osseous metastatic disease much more conspicuous on prior PET-CT than on today's diagnostic CT examination. There are some subtle lucencies corresponding such to some of the known lesions (for example, the right pubic bone and the left ninth  rib laterally) but most of the lesions are essentially completely occult. There is mild presacral edema but I do not see a definite sacral fracture. Lumbar spondylosis and degenerative disc disease cause moderate bilateral foraminal stenosis at L5-S1. IMPRESSION: 1. There is some formed stool and contrast medium in the distal colon. No dilated bowel. No significant abnormal bowel wall thickening to suggest over colitis. 2. Increased pleural thickening at the left lung  base posteriorly, potentially from pleural tumor. 3. New presacral edema. No definite sacral fracture is identified. Cause of this presacral edema is uncertain. 4. The patient has known innumerable bony lesions based on recent PET-CT, but these are for the most part occult on today's diagnostic CT. 5. Innumerable pulmonary nodules and masses in the lung bases, essentially stable. 6. Bilateral metastatic lesions in the adrenal glands, stable. 7. Bilateral foraminal impingement at L5-S1. 8.  Aortic Atherosclerosis (ICD10-I70.0). Electronically Signed   By: Van Clines M.D.   On: 06/19/2018 17:28    Impression / Plan:   Impression: 1.  Diarrhea: Every 3 minutes to an hour started 06/17/2018 after receiving chemotherapy and immunotherapy including Keytruda on 06/16/2018, CT as above with some formed stool and contrast medium in the distal colon with no dilated dilated bowel, no significant abnormal bowel wall thickening to suggest colitis and other findings above 2.  Abdominal tenderness: pt reports just before a BM relieved afterwards 3.  Small cell lung cancer currently undergoing chemotherapy and immunotherapy 4.  Microcytic anemia: Apparently this is stable for the patient, hgb 9.9-->8.6-->8.7 5.  AKI on CKD stage III 6. History of cecal polyp: History of polyp only partially removed, plans were for repeat colonoscopy in November 2011 but patient never had this done  Plan: 1.  Agree with IV fluid hydration 2.  Continue to observe for  further Gi bleeding, continue to monitor hgb with transfusion as needed 3. Changed patient to Methylprednisolone 20mg  IV BID. 4. Will discuss above with Dr. Rush Landmark, please await further recommendations from him later today  Thank you for your kind consultation, we will continue to follow.  Lavone Nian Donaldson Richter  06/21/2018, 10:45 AM

## 2018-06-21 NOTE — Evaluation (Signed)
Occupational Therapy Evaluation Patient Details Name: Zachary Hobbs MRN: [id number] DOB: [birth year] Today's Date: 06/21/2018    History of Present Illness 77 yo male with history of small cell lung cancer admitted 06/19/18 with bloody diarrhea,  AKI,receiving chemotherapy. Recent brain MRI demonstrates metastatic leasions. Patient having  multiple falls and unsteady gait PTA.   Clinical Impression   Pt was admitted for the above. Wife recently had to assist him extensively with adls due to weakness and stayed with him or had son stay with him 24/7 due to falls.  She works Medical laboratory scientific officer.  Pt will benefit from continued OT to increase safety and independence with adls and transfers.  Anticipate he will need SNF for rehab after acute stay.  Pt needs mod +2 for safety at this time due to knee buckling for standing during adls and max to total A for LB adls.    Follow Up Recommendations  SNF    Equipment Recommendations  3 in 1 bedside commode    Recommendations for Other Services       Precautions / Restrictions Precautions Precautions: Fall Restrictions Weight Bearing Restrictions: No      Mobility Bed Mobility Overal bed mobility: Needs Assistance Bed Mobility: Supine to Sit     Supine to sit: Min assist;+2 for safety/equipment     General bed mobility comments: multimodal cues for safety, and to follow directions for mobility  Transfers Overall transfer level: Needs assistance Equipment used: Rolling walker (2 wheeled) Transfers: Sit to/from [facility] Sit to Stand: Mod assist;+2 safety/equipment Stand pivot transfers: Mod assist;+2 safety/equipment       General transfer comment: multimodal cues fior hand placement and safety to pivot to [facility] using RW    Balance Overall balance assessment: History of Falls;Needs assistance Sitting-balance support: Bilateral upper extremity supported;Feet supported Sitting balance-Leahy Scale: Fair     Standing balance  support: During functional activity;Bilateral upper extremity supported Standing balance-Leahy Scale: Poor                             ADL either performed or assessed with clinical judgement   ADL Overall ADL's : Needs assistance/impaired Eating/Feeding: Set up   Grooming: Set up   Upper Body Bathing: Minimal assistance;Sitting   Lower Body Bathing: Maximal assistance;+2 for physical assistance;+2 for safety/equipment;Sit to/from stand   Upper Body Dressing : Moderate assistance;Sitting   Lower Body Dressing: Total assistance;+2 for physical assistance;Sit to/from stand   Toilet Transfer: Minimal assistance;+2 for physical assistance;+2 for safety/equipment;RW;Stand-pivot(to chair)             General ADL Comments: knee buckles at times when standing. Wife was helping extensively with adls prior to admission.       Vision         Perception     Praxis      Pertinent Vitals/Pain Pain Assessment: Faces Faces Pain Scale: Hurts even more Pain Location: buttock Pain Descriptors / Indicators: Discomfort;Guarding Pain Intervention(s): Limited activity within patient's tolerance;Monitored during session;Repositioned     Hand Dominance     Extremity/Trunk Assessment  UE:  Generalized weakness   Lower Extremity Assessment Lower Extremity Assessment: LLE deficits/detail;RLE deficits/detail LLE Deficits / Details: noted to buckle during transfers   Cervical / Trunk Assessment Cervical / Trunk Assessment: Normal   Communication Communication Communication: No difficulties   Cognition Arousal/Alertness: Awake/alert Behavior During Therapy: Restless Overall Cognitive Status: Impaired/Different from baseline Area of Impairment: Orientation;Following commands;Safety/judgement;Problem solving  Orientation Level: Place;Time;Situation Current Attention Level: Focused   Following Commands: (multi modal cues needed)     Problem  Solving: Slow processing General Comments: perseverating on needing to go to [facility], even after assisted to [facility].  Not really understanding that he was on [facility] at first   General Comments       Exercises     Shoulder Instructions      [place] expects to be discharged to:: Private residence Living Arrangements: Spouse/significant other Available Help at Discharge: Family Type of Home: House Home Access: Stairs to enter(2 no rails)     Home Layout: One level     Bathroom Shower/Tub: Tub/shower unit(sponge bathes)   Bathroom Toilet: Standard     Home Equipment: None   Additional Comments: wife works; son has been helping      Prior Functioning/Environment Level of Independence: Needs assistance  Gait / Transfers Assistance Needed: has been furniture walking and falling ADL's / Homemaking Assistance Needed: wife helping with bathing/dressing:  mod to max A   Comments: furniture walks        OT Problem List: Decreased strength;Decreased activity tolerance;Impaired balance (sitting and/or standing);Decreased cognition;Decreased safety awareness;Pain;Decreased knowledge of use of DME or AE      OT Treatment/Interventions: Self-care/ADL training;DME and/or AE instruction;Patient/family education;Balance training;Therapeutic activities;Cognitive remediation/compensation    OT Goals(Current goals can be found in the care plan section) Acute Rehab OT Goals Patient Stated Goal: per wife, to be able to walk and go home OT Goal Formulation: With family Time For Goal Achievement: 07/05/18 Potential to Achieve Goals: Good ADL Goals Pt Will Transfer to Toilet: bedside commode;stand pivot transfer;with min guard assist Pt Will Perform Toileting - Clothing Manipulation and hygiene: with min guard assist;sitting/lateral leans;sit to/from stand Additional ADL Goal #1: pt will follow 75% commands in context with single cue within 10 seconds Additional ADL Goal #2: pt  will sustain attention to task x 5 minutes during adl actvitiy  OT Frequency: Min 2X/week   Barriers to D/C:            Co-evaluation              AM-PAC PT "6 Clicks" Daily Activity     Outcome Measure Help from another person eating meals?: A Little Help from another person taking care of personal grooming?: A Little Help from another person toileting, which includes using toliet, bedpan, or urinal?: A Lot Help from another person bathing (including washing, rinsing, drying)?: A Lot Help from another person to put on and taking off regular upper body clothing?: A Lot Help from another person to put on and taking off regular lower body clothing?: Total 6 Click Score: 13   End of Session    Activity Tolerance: Patient limited by fatigue Patient left: in chair;with call bell/phone within reach;with chair alarm set;with family/visitor present  OT Visit Diagnosis: Unsteadiness on feet (R26.81);Muscle weakness (generalized) (M62.81);Other symptoms and signs involving cognitive function                Time: 9735-3299 OT Time Calculation (min): 29 min Charges:  OT General Charges $OT Visit: 1 Visit OT Evaluation $OT Eval Moderate Complexity: Joaquin, OTR/L [phone] 06/21/2018  Carely Nappier 06/21/2018, 1:19 PM

## 2018-06-21 NOTE — Progress Notes (Signed)
PROGRESS NOTE    Zachary Hobbs  ACZ:[id number] DOB: [birth year] DOA: 06/19/2018 PCP: Janie Morning, DO    Brief Narrative:  Pt. with PMH of metastatic small cell lung cancer, hypertension, CKD stage III, GERD, hyperlipidemia, anemia; admitted on 06/19/2018, presented with complaint of diarrhea, was found to have aki . Currently further plan is continue IV hydration .   Assessment & Plan:   Active Problems:   HLD (hyperlipidemia)   Essential hypertension   Malignant neoplasm of upper lobe of left lung (HCC)   Diarrhea   GERD (gastroesophageal reflux disease)   Microcytic anemia   Acute kidney injury superimposed on chronic kidney disease (HCC)   Hypermagnesemia   Elevated AST (SGOT)   Confusion   Lower GI bleed   Constipation  #1??  Diarrhea with abdominal tenderness likely secondary to constipation versus immune mediated/chemotherapy mediated Patient noted on CT abdomen and pelvis formed stool and contrast medium in the distal colon with no dilated bowel and no significant bowel wall thickening to suggest colitis.  Patient now with bloody bowel movements.  Patient with no abdominal pain.  Patient on chronic opioid therapy.  We will give a Dulcolax suppository.  Follow.  2.  Bloody bowel movement/probable lower GI bleed Patient with bloody bowel movements per RN and per wife.  Hemoglobin currently at 8.7 from 9.9 on admission.  Patient with a history of metastatic lung cancer with bone mets and brain mets.  Patient also with a prior history of colonoscopy in 2011 with a 3 mm sessile polyp noted removed piecemeal with pathology consistent with tubular adenoma.  Patient also noted to have 2 other polyps with biopsies positive for tubular adenoma.  Patient now with bloody bowel movement.  Place on a PPI.  Consult with GI for further evaluation and management.  3.  Anemia Check an anemia panel.  Follow H&H.  Transfusion threshold hemoglobin less than 7.  4.  Lung cancer with metastases  to the brain and bone Patient being followed by Dr. Lorna Few with recent chemotherapy and immunotherapy.  Patient started on empiric steroids for possible immune mediated colitis.  Dr. Lorna Few was notified and per Dr. Posey Pronto will assess the patient.  Patient currently receiving radiation treatment.  Due to patient's confusion CT head was ordered however MRI of the brain has been ordered per radiation oncology and as such we will discontinue head CT.  Will place patient on Keppra for seizure prophylaxis.  Follow.  5.  Hypertension Stable.  Valsartan/HCTZ on hold secondary to presentation with diarrhea and lower blood pressure.  Follow.  6.  Gastroesophageal reflux disease PPI.  7.  Hyperlipidemia Continue statin.  8.  Acute kidney injury on chronic kidney disease stage III Likely secondary to a prerenal azotemia in the setting of ARB and diuretic.  Antihypertensive medications on hold.  Slowly trending down and improving with hydration.  Follow.  9.  Acute metabolic encephalopathy Likely secondary to brain mets and dehydration.  MRI of the brain has been ordered per radiation oncology.  Continue gentle hydration.  Follow.  10.  Hypermagnesemia Slowly trending down.  Follow.    DVT prophylaxis: SCDs. Code Status: Full Family Communication: Updated patient and wife at bedside. Disposition Plan: To be determined.  Pending work-up.   Consultants:   Oncology via epic, Dr. Lorna Few  Gastroenterology pending  Procedures:   CT abdomen and pelvis 06/19/2018    Antimicrobials:   None   Subjective: Per RN and per wife patient with  some bloody bowel movements yesterday.  Per wife decrease loose stools since admission.  Patient denies any chest pain or shortness of breath.  Patient somewhat confused.  Patient denies any chest pain.  Poor oral intake.  Objective: Vitals:   06/20/18 0604 06/20/18 2025 06/21/18 0338 06/21/18 0858  BP:  116/65 132/77 135/71   Pulse:  80 90 86  Resp:  16 20 16   Temp:  98 F (36.7 C) 98.2 F (36.8 C) (!) 97.4 F (36.3 C)  TempSrc:      SpO2:  98% 98% 98%  Weight: 62.1 kg       Intake/Output Summary (Last 24 hours) at 06/21/2018 1041 Last data filed at 06/21/2018 0000 Gross per 24 hour  Intake 403.5 ml  Output 450 ml  Net -46.5 ml   Filed Weights   06/20/18 0604  Weight: 62.1 kg    Examination:  General exam: Appears calm and comfortable  Respiratory system: Clear to auscultation. Respiratory effort normal. Cardiovascular system: S1 & S2 heard, RRR. No JVD, murmurs, rubs, gallops or clicks. No pedal edema. Gastrointestinal system: Abdomen is nondistended, soft and nontender. No organomegaly or masses felt. Normal bowel sounds heard. Central nervous system: Alert and oriented. No focal neurological deficits. Extremities: Symmetric 5 x 5 power. Skin: No rashes, lesions or ulcers Psychiatry: Judgement and insight appear normal. Mood & affect appropriate.     Data Reviewed: I have personally reviewed following labs and imaging studies  CBC: Recent Labs  Lab 06/15/18 1310 06/19/18 1014 06/20/18 0614 06/21/18 0634  WBC 9.7 8.6 6.0 4.6  NEUTROABS 7.8*  --   --  3.9  HGB 9.8* 9.9* 8.6* 8.7*  HCT 30.7* 31.0* 27.1* 27.6*  MCV 74.0* 73.6* 73.4* 74.0*  PLT 400 285 246 354   Basic Metabolic Panel: Recent Labs  Lab 06/15/18 1310 06/19/18 1014 06/20/18 0614 06/21/18 0634  NA 139 143 142 143  K 5.2* 4.5 4.5 4.5  CL 103 110 110 114*  CO2 19* 21* 22 19*  GLUCOSE 94 119* 132* 104*  BUN 110* 95* 79* 60*  CREATININE 2.25* 2.36* 2.01* 1.80*  CALCIUM 11.3* 8.9 7.8* 7.5*  MG  --  2.7* 2.5* 2.5*  PHOS  --   --  3.4  --    GFR: Estimated Creatinine Clearance: 31.1 mL/min (A) (by C-G formula based on SCr of 1.8 mg/dL (H)). Liver Function Tests: Recent Labs  Lab 06/15/18 1310 06/19/18 1014 06/20/18 0614 06/21/18 0634  AST 26 30 24 21   ALT 57* 69* 51* 42  ALKPHOS 198* 184* 143* 132*    BILITOT 1.0 1.2 0.8 1.1  PROT 8.2* 7.2 6.1* 6.0*  ALBUMIN 2.3* 2.4* 2.0* 1.9*   Recent Labs  Lab 06/19/18 1014  LIPASE 20   No results for input(s): AMMONIA in the last 168 hours. Coagulation Profile: No results for input(s): INR, PROTIME in the last 168 hours. Cardiac Enzymes: No results for input(s): CKTOTAL, CKMB, CKMBINDEX, TROPONINI in the last 168 hours. BNP (last 3 results) No results for input(s): PROBNP in the last 8760 hours. HbA1C: No results for input(s): HGBA1C in the last 72 hours. CBG: Recent Labs  Lab 06/20/18 0500 06/21/18 0824  GLUCAP 122* 88   Lipid Profile: No results for input(s): CHOL, HDL, LDLCALC, TRIG, CHOLHDL, LDLDIRECT in the last 72 hours. Thyroid Function Tests: Recent Labs    06/20/18 0614  TSH 0.573   Anemia Panel: No results for input(s): VITAMINB12, FOLATE, FERRITIN, TIBC, IRON, RETICCTPCT in the last 72  hours. Sepsis Labs: No results for input(s): PROCALCITON, LATICACIDVEN in the last 168 hours.  Recent Results (from the past 240 hour(s))  Culture, Urine     Status: None   Collection Time: 06/19/18  2:16 PM  Result Value Ref Range Status   Specimen Description   Final    URINE, CLEAN CATCH Performed at [facility] [address] [place], [place] [address]    Special Requests   Final    NONE Performed at [facility], [place] [address] [place], [place] [address]    Culture   Final    NO GROWTH Performed at [facility], [place] [address], [place], [place] [address]    Report Status 06/20/2018 FINAL  Final         Radiology Studies: Ct Abdomen Pelvis Wo Contrast  Result Date: 06/19/2018 CLINICAL DATA:  Small cell carcinoma of the lung, inpatient for chemotherapy in immunotherapy, abdominal pain EXAM: CT ABDOMEN AND PELVIS WITHOUT CONTRAST TECHNIQUE: Multidetector CT imaging of the abdomen and pelvis was performed following the standard protocol without IV contrast. COMPARISON:   Multiple exams, including PET-CT from 05/29/2018 FINDINGS: Lower chest: Numerous metastatic lesions in the lung bases. Index lesion in the left lower lobe posterior basal segment measures 3.0 by 2.2 cm, formerly 2.9 by 2.2 cm. Increased pleural thickening at the left lung base posteriorly, possibly from pleural tumor. Hepatobiliary: Accentuated density in the gallbladder possibly from sludge. Low sensitivity for early metastatic disease to the liver due to lack of IV contrast. Pancreas: Unremarkable Spleen: Unremarkable Adrenals/Urinary Tract: Previously hypermetabolic right adrenal mass 1.6 cm transverse, previously 1.5 cm. Previously hypermetabolic left adrenal mass 2.6 by 2.1 cm on image 18/2, formerly 1.8 by 2.5 cm. No urinary tract calculi observed. The renal contours appear normal. Stomach/Bowel: Small amount of retained contrast medium in the colon. No dilated bowel. There is some formed stool in the distal colon. No significant degree of bowel wall thickening identified. Vascular/Lymphatic: Aortoiliac atherosclerotic vascular disease. No pathologic adenopathy identified. Reproductive: Unremarkable Other: New presacral edema. Musculoskeletal: The patient has known widespread osseous metastatic disease much more conspicuous on prior PET-CT than on today's diagnostic CT examination. There are some subtle lucencies corresponding such to some of the known lesions (for example, the right pubic bone and the left ninth rib laterally) but most of the lesions are essentially completely occult. There is mild presacral edema but I do not see a definite sacral fracture. Lumbar spondylosis and degenerative disc disease cause moderate bilateral foraminal stenosis at L5-S1. IMPRESSION: 1. There is some formed stool and contrast medium in the distal colon. No dilated bowel. No significant abnormal bowel wall thickening to suggest over colitis. 2. Increased pleural thickening at the left lung base posteriorly, potentially  from pleural tumor. 3. New presacral edema. No definite sacral fracture is identified. Cause of this presacral edema is uncertain. 4. The patient has known innumerable bony lesions based on recent PET-CT, but these are for the most part occult on today's diagnostic CT. 5. Innumerable pulmonary nodules and masses in the lung bases, essentially stable. 6. Bilateral metastatic lesions in the adrenal glands, stable. 7. Bilateral foraminal impingement at L5-S1. 8.  Aortic Atherosclerosis (ICD10-I70.0). Electronically Signed   By: Van Clines M.D.   On: 06/19/2018 17:28        Scheduled Meds: . bisacodyl  10 mg Rectal Once  . pantoprazole  40 mg Oral Daily  . predniSONE  65 mg Oral Q breakfast  .  simvastatin  40 mg Oral QPM   Continuous Infusions: . sodium chloride 100 mL/hr at 06/20/18 1617     LOS: 2 days    Time spent: 35 minutes    Irine Seal, MD [facility] Pager [phone]  If 7PM-7AM, please contact night-coverage [email or web address] Password [facility] 06/21/2018, 10:41 AM

## 2018-06-21 NOTE — Care Management Note (Signed)
Case Management Note  Patient Details  Name: Zachary Hobbs MRN: [id number] Date of Birth: May 29, 1941  Subjective/Objective:                  PMH of metastatic small cell lung cancer, hypertension, CKD stage III, GERD, hyperlipidemia, anemia; admitted on 06/19/2018, presented with complaint of diarrhea, was found to have aki . Significant Diarrhea with Abdominal Tenderness r/o Colitis and suspect Immune Mediated/Chemotherapy mediated -Likely in the Setting of Chemotherapy and Immuno thearpy and ? Immune Mediated Colitis  -CT of Abd and Pelvis suggest formed stool and no colitis  -On IVF for Supportive Care -Not febrile and has no Leukocytosis so less likley infectious -C Difficile lower on Differential but will check it.  - based on CT with stoll may need stool softner  Action/Plan: Progression-following fecal occult blood-positive/ hgb stable at 8.7/iv ns at 75cc/hr Cm needs-none present at this time- Lives at home with spouse and has a good support family group. Expected Discharge Date:  (unknown)               Expected Discharge Plan:     In-House Referral:     Discharge planning Services     Post Acute Care Choice:    Choice offered to:     DME Arranged:    DME Agency:     HH Arranged:    HH Agency:     Status of Service:     If discussed at [employer], dates discussed:    Additional Comments:  Leeroy Cha, RN 06/21/2018, 10:29 AM

## 2018-06-21 NOTE — Evaluation (Signed)
Physical Therapy Evaluation Patient Details Name: Zachary Hobbs MRN: [id number] DOB: 11/27/1940 Today's Date: 06/21/2018   History of Present Illness  77 yo male with history of small cell lung cancer admitted 06/19/18 with bloody diarrhea,  AKI,receiving chemotherapy. Recent brain MRI demonstrates metastatic leasions. Patient having  multiple falls and unsteady gait PTA.  Clinical Impression  The patient requires mod assist for mobility to [facility] and recliner. Noted to have decreased balance using RW. Appears that the left leg is  Weaker.  Wife present, reports falls PTA. Pt admitted with above diagnosis. Pt currently with functional limitations due to the deficits listed below (see PT Problem List).    Pt will benefit from skilled PT to increase their independence and safety with mobility to allow discharge to the venue listed below.       Follow Up Recommendations SNF    Equipment Recommendations  None recommended by PT    Recommendations for Other Services       Precautions / Restrictions Precautions Precautions: Fall Restrictions Weight Bearing Restrictions: No      Mobility  Bed Mobility Overal bed mobility: Needs Assistance Bed Mobility: Supine to Sit     Supine to sit: Min assist;+2 for safety/equipment     General bed mobility comments: multimodal cues for safety, and to follow directions for mobility  Transfers Overall transfer level: Needs assistance Equipment used: Rolling walker (2 wheeled) Transfers: Sit to/from [facility] Sit to Stand: Mod assist;+2 safety/equipment Stand pivot transfers: Mod assist;+2 safety/equipment       General transfer comment: multimodal cues fior hand placement and safety to pivot to [facility] using RW  Ambulation/Gait Ambulation/Gait assistance: Mod assist;+2 physical assistance;+2 safety/equipment Gait Distance (Feet): 5 Feet Assistive device: Rolling walker (2 wheeled) Gait Pattern/deviations: Step-to  pattern;Staggering right;Staggering left     General Gait Details: decreased balance during mobility. Multimodal cues for  safety to move to recliner using RW  Stairs            Wheelchair Mobility    Modified Rankin (Stroke Patients Only)       Balance Overall balance assessment: History of Falls;Needs assistance Sitting-balance support: Bilateral upper extremity supported;Feet supported Sitting balance-Leahy Scale: Fair     Standing balance support: During functional activity;Bilateral upper extremity supported Standing balance-Leahy Scale: Poor                               Pertinent Vitals/Pain Pain Assessment: Faces Faces Pain Scale: Hurts even more Pain Location: buttock Pain Descriptors / Indicators: Discomfort;Guarding Pain Intervention(s): Monitored during session    Home Living Family/patient expects to be discharged to:: Private residence Living Arrangements: Spouse/significant other Available Help at Discharge: Family Type of Home: House Home Access: Stairs to enter(2 no rails)     Home Layout: One level Home Equipment: None Additional Comments: wife works; son has been helping    Prior Function Level of Independence: Needs assistance   Gait / Transfers Assistance Needed: has been furniture walking and falling  ADL's / Homemaking Assistance Needed: wife helping with bathing/dressing:  mod to max A  Comments: furniture walks     Hand Dominance        Extremity/Trunk Assessment        Lower Extremity Assessment Lower Extremity Assessment: LLE deficits/detail;RLE deficits/detail LLE Deficits / Details: noted to buckle during transfers    Cervical / Trunk Assessment Cervical / Trunk Assessment: Normal  Communication  Communication: No difficulties  Cognition Arousal/Alertness: Awake/alert Behavior During Therapy: Restless Overall Cognitive Status: Impaired/Different from baseline Area of Impairment:  Orientation;Following commands;Safety/judgement;Problem solving                 Orientation Level: Place;Time;Situation     Following Commands: Follows one step commands inconsistently     Problem Solving: Slow processing General Comments: perseverating on needing to go to [facility], even after assisted to [facility].      General Comments      Exercises     Assessment/Plan    PT Assessment Patient needs continued PT services  PT Problem List Decreased strength;Decreased knowledge of use of DME;Decreased activity tolerance;Decreased knowledge of precautions;Decreased mobility;Pain       PT Treatment Interventions DME instruction;Gait training;Functional mobility training;Therapeutic activities;Patient/family education;Cognitive remediation;Balance training    PT Goals (Current goals can be found in the Care Plan section)  Acute Rehab PT Goals Patient Stated Goal: per wife, to be able to walk and go home PT Goal Formulation: With family Time For Goal Achievement: 07/05/18 Potential to Achieve Goals: Fair    Frequency Min 2X/week   Barriers to discharge        Co-evaluation PT/OT/SLP Co-Evaluation/Treatment: Yes             AM-PAC PT "6 Clicks" Daily Activity  Outcome Measure Difficulty turning over in bed (including adjusting bedclothes, sheets and blankets)?: Unable Difficulty moving from lying on back to sitting on the side of the bed? : Unable Difficulty sitting down on and standing up from a chair with arms (e.g., wheelchair, bedside commode, etc,.)?: Unable Help needed moving to and from a bed to chair (including a wheelchair)?: Total Help needed walking in hospital room?: Total Help needed climbing 3-5 steps with a railing? : Total 6 Click Score: 6    End of Session Equipment Utilized During Treatment: Gait belt Activity Tolerance: Patient limited by fatigue Patient left: in chair;with call bell/phone within reach;with chair alarm set;with family/visitor  present Nurse Communication: Mobility status PT Visit Diagnosis: Unsteadiness on feet (R26.81);Pain;Repeated falls (R29.6)    Time: 1638-4536 PT Time Calculation (min) (ACUTE ONLY): 25 min   Charges:   PT Evaluation $PT Eval Low Complexity: 1 Low           [place] [facility] [phone]   Claretha Cooper 06/21/2018, 1:06 PM

## 2018-06-22 ENCOUNTER — Inpatient Hospital Stay (HOSPITAL_COMMUNITY): Payer: 59

## 2018-06-22 ENCOUNTER — Other Ambulatory Visit: Payer: 59

## 2018-06-22 ENCOUNTER — Ambulatory Visit
Admission: RE | Admit: 2018-06-22 | Discharge: 2018-06-22 | Disposition: A | Discharge status: Home / Self Care | Payer: 59 | Source: Ambulatory Visit | Attending: Radiation Oncology | Admitting: Radiation Oncology

## 2018-06-22 ENCOUNTER — Other Ambulatory Visit (HOSPITAL_COMMUNITY): Payer: 59

## 2018-06-22 DIAGNOSIS — E860 Dehydration: Secondary | ICD-10-CM

## 2018-06-22 DIAGNOSIS — I639 Cerebral infarction, unspecified: Secondary | ICD-10-CM

## 2018-06-22 LAB — CBC
HCT: 23.7 % — ABNORMAL LOW (ref 39.0–52.0)
Hemoglobin: 7.7 g/dL — ABNORMAL LOW (ref 13.0–17.0)
MCH: 23.8 pg — AB (ref 26.0–34.0)
MCHC: 32.5 g/dL (ref 30.0–36.0)
MCV: 73.4 fL — AB (ref 78.0–100.0)
PLATELETS: 175 10*3/uL (ref 150–400)
RBC: 3.23 MIL/uL — ABNORMAL LOW (ref 4.22–5.81)
RDW: 16.6 % — AB (ref 11.5–15.5)
WBC: 2.5 10*3/uL — ABNORMAL LOW (ref 4.0–10.5)

## 2018-06-22 LAB — COMPREHENSIVE METABOLIC PANEL
ALT: 36 U/L (ref 0–44)
AST: 20 U/L (ref 15–41)
Albumin: 1.8 g/dL — ABNORMAL LOW (ref 3.5–5.0)
Alkaline Phosphatase: 120 U/L (ref 38–126)
Anion gap: 7 (ref 5–15)
BUN: 53 mg/dL — ABNORMAL HIGH (ref 8–23)
CHLORIDE: 115 mmol/L — AB (ref 98–111)
CO2: 19 mmol/L — ABNORMAL LOW (ref 22–32)
CREATININE: 1.74 mg/dL — AB (ref 0.61–1.24)
Calcium: 7.1 mg/dL — ABNORMAL LOW (ref 8.9–10.3)
GFR calc Af Amer: 42 mL/min — ABNORMAL LOW (ref >60)
GFR, EST NON AFRICAN AMERICAN: 37 mL/min — AB (ref >60)
Glucose, Bld: 121 mg/dL — ABNORMAL HIGH (ref 70–99)
POTASSIUM: 4.7 mmol/L (ref 3.5–5.1)
Sodium: 141 mmol/L (ref 135–145)
TOTAL PROTEIN: 5.6 g/dL — AB (ref 6.5–8.1)
Total Bilirubin: 0.9 mg/dL (ref 0.3–1.2)

## 2018-06-22 LAB — ECHOCARDIOGRAM COMPLETE: Weight: 2190.49 [oz_av]

## 2018-06-22 LAB — MRSA PCR SCREENING: MRSA by PCR: NEGATIVE

## 2018-06-22 LAB — MAGNESIUM: MAGNESIUM: 2.4 mg/dL (ref 1.7–2.4)

## 2018-06-22 LAB — GLUCOSE, CAPILLARY: Glucose-Capillary: 100 mg/dL — ABNORMAL HIGH (ref 70–99)

## 2018-06-22 MED ORDER — BISACODYL 10 MG RE SUPP: 10.0000 mg | Freq: Every day | RECTAL | Status: DC | PRN

## 2018-06-22 MED ORDER — PEG-KCL-NACL-NASULF-NA ASC-C 100 G PO SOLR: 1.0000 | Freq: Once | ORAL | Status: DC

## 2018-06-22 MED ORDER — ACETAMINOPHEN 160 MG/5ML PO SOLN: 650.0000 mg | ORAL | Status: DC | PRN

## 2018-06-22 MED ORDER — ACETAMINOPHEN 650 MG RE SUPP: 650.0000 mg | RECTAL | Status: DC | PRN

## 2018-06-22 MED ORDER — STROKE: EARLY STAGES OF RECOVERY BOOK
Freq: Once | Status: AC
  Administered 2018-06-22: 1
  Filled 2018-06-22: qty 1

## 2018-06-22 MED ORDER — PEG-KCL-NACL-NASULF-NA ASC-C 100 G PO SOLR
0.5000 | Freq: Once | ORAL | Status: AC
  Administered 2018-06-22: 100 g via ORAL
  Filled 2018-06-22: qty 1

## 2018-06-22 MED ORDER — PEG-KCL-NACL-NASULF-NA ASC-C 100 G PO SOLR
0.5000 | Freq: Once | ORAL | Status: DC
  Filled 2018-06-22: qty 1

## 2018-06-22 MED ORDER — ACETAMINOPHEN 325 MG PO TABS: 650.0000 mg | ORAL_TABLET | ORAL | Status: DC | PRN

## 2018-06-22 MED ORDER — SENNOSIDES-DOCUSATE SODIUM 8.6-50 MG PO TABS: 1.0000 | ORAL_TABLET | Freq: Every evening | ORAL | Status: DC | PRN

## 2018-06-22 NOTE — Evaluation (Signed)
SLP Cancellation Note  Patient Details Name: Zachary Hobbs MRN: [id number] DOB: 18-May-1941   Cancelled treatment:       Reason Eval/Treat Not Completed: Other (comment)(pt working with NP and RN readying to transfer to [place], will continue efforts for cognitive eval, RN to perform stroke swallow screen)   Macario Golds 06/22/2018, 10:46 AM   Luanna Salk, [facility] SLP [phone]

## 2018-06-22 NOTE — Progress Notes (Addendum)
Progress Note   Subjective  Chief Complaint: Diarrhea, hematochezia  Today, wife is by his bedside and he is asleep.  She explains that he only had about 2-3 bowel movements yesterday which is great improvement since arriving in the hospital but these did turn slightly more loose yesterday as compared to the day before.  Denies seeing any more bright red blood in his stool at least yesterday from "what she could tell".   Objective   Vital signs in last 24 hours: Temp:  [97.5 F (36.4 C)-98.2 F (36.8 C)] 98.2 F (36.8 C) (08/22 0920) Pulse Rate:  [80-85] 82 (08/22 0920) Resp:  [14-18] 14 (08/22 0920) BP: (124-139)/(71-78) 124/71 (08/22 0920) SpO2:  [96 %-98 %] 96 % (08/22 0920) Last BM Date: 06/21/18 General:    AA male in NAD Heart:  Regular rate and rhythm; no murmurs Lungs: Respirations even and unlabored, lungs CTA bilaterally Abdomen:  Soft, nontender and nondistended. Normal bowel sounds. Extremities:  Without edema. Neurologic:  Alert and oriented,  grossly normal neurologically. Psych:  Cooperative. Normal mood and affect.  Intake/Output from previous day: 08/21 0701 - 08/22 0700 In: 3510.7 [P.O.:360; I.V.:3150.7] Out: -   Lab Results: Recent Labs    06/20/18 0614 06/21/18 0634 06/22/18 0610  WBC 6.0 4.6 2.5*  HGB 8.6* 8.7* 7.7*  HCT 27.1* 27.6* 23.7*  PLT 246 218 175   BMET Recent Labs    06/20/18 0614 06/21/18 0634 06/22/18 0610  NA 142 143 141  K 4.5 4.5 4.7  CL 110 114* 115*  CO2 22 19* 19*  GLUCOSE 132* 104* 121*  BUN 79* 60* 53*  CREATININE 2.01* 1.80* 1.74*  CALCIUM 7.8* 7.5* 7.1*   LFT Recent Labs    06/22/18 0610  PROT 5.6*  ALBUMIN 1.8*  AST 20  ALT 36  ALKPHOS 120  BILITOT 0.9   Studies/Results: Mr Jeri Cos WU Contrast  Result Date: 06/21/2018 CLINICAL DATA:  Follow-up metastatic lung cancer. EXAM: MRI HEAD WITHOUT AND WITH CONTRAST TECHNIQUE: Multiplanar, multiecho pulse sequences of the brain and surrounding structures  were obtained without and with intravenous contrast. CONTRAST:  8mL MULTIHANCE GADOBENATE DIMEGLUMINE 529 MG/ML IV SOLN COMPARISON:  Noncontrast MRI head June 07, 2018 FINDINGS: Sequences vary from moderate to severely motion degraded. Motion degraded examination. Patient received Percocet. BRAIN Lesions: At least 4: 14 x 15 mm enhancing lesion located RIGHT temporal lobe with mild surrounding vasogenic edema and and no significant mass effect seen on series 12, image 62. 8 x 9 mm enhancing lesion located LEFT frontal periventricular white matter without significant vasogenic edema or mass effect seen on series 12, image 98. 6 x 6 mm enhancing lesion located LEFT frontal convexity cortex with proportional vasogenic edema, no significant mass effect seen on series 12, image 126. 7 x 10 mm enhancing lesion located LEFT parietal convexity cortex with proportional vasogenic edema, no significant mass effect seen on series 12, image 130. 4 mm enhancing lesion located RIGHT mesial temporal lobe without vasogenic edema or mass effect may be artifact only seen on series 17, image 33. Other Brain findings: INTRACRANIAL CONTENTS: RIGHT cerebellar developmental venous anomaly. Subcentimeter reduced diffusion LEFT cerebellum with low ADC values. Subcentimeter reduced diffusion RIGHT parietal cortex with low ADC values. No susceptibility artifact to suggest hemorrhage. Patchy to confluent supratentorial white matter FLAIR T2 hyperintensities exclusive aforementioned abnormalities. Moderate parenchymal brain volume loss. No hydrocephalus. RIGHT cerebellar developmental venous anomaly. No abnormal extra-axial fluid collections. Enhancement along the anterior falx. VASCULAR:  Normal major intracranial vascular flow voids present at skull base. SKULL AND UPPER CERVICAL SPINE: No abnormal sellar expansion. Abnormally decreased bone marrow signal and faint enhancement C3 and C4 vertebral bodies concerning for osseous metastasis.  Craniocervical junction maintained. SINUSES/ORBITS: The mastoid air-cells and included paranasal sinuses are well-aerated.The included ocular globes and orbital contents are non-suspicious. OTHER: None. IMPRESSION: 1. Moderate to severely motion degraded examination: 4-5 supratentorial intraparenchymal metastasis largest in RIGHT temporal lobe measuring 14 x 15 mm. C3 and C4 suspected osseous metastasis. No pathologic fracture. 2. Acute subcentimeter RIGHT parietal and LEFT cerebellar infarcts. 3. Moderate parenchymal brain volume loss and moderate chronic small vessel ischemic changes. 4. These results will be called to the ordering clinician or representative by the Radiologist Assistant, and communication documented in the PACS or zVision Dashboard. Electronically Signed   By: Elon Alas M.D.   On: 06/21/2018 23:05     Assessment / Plan:   Assessment: 1.  Diarrhea: Only 3 episodes yesterday but this was slightly looser than the day before per his wife, again considering most likely drug induced diarrhea, ruling out infectious etiologies 2.  Small cell lung cancer currently undergoing chemotherapy and immunotherapy 3.  Microcytic anemia: hgb 8.7-->7.7 overnight, brb in stool reported yesterday 4.  AKI on CKD stage III  Plan: 1.  Continue to observe for further GI bleeding with monitoring of hemoglobin and transfusion as needed less than 7 2.  Again patient will continue on Methylprednisolone 20 mg IV twice daily for now 3.  Apparently GI pathogen panel had not been ordered, did order this this morning and patient is on enteric precautions now 4.  Will go ahead and schedule patient for colonoscopy tomorrow for further eval. Discussed risks, benefits, limitations and alternatives and the patient agrees to proceed. Ordered Movi prep to start tonight, clears the rest of the day and NPO at midnight. 5.  Please await further recommendations from Dr. Rush Landmark later today  Thank you for your kind  consultation, we will continue to follow.   LOS: 3 days   Levin Erp  06/22/2018, 9:36 AM

## 2018-06-22 NOTE — Consult Note (Addendum)
Neurology Consultation  Reason for Consult: Stroke  Referring Physician: Dr. Grandville Silos  CC: Stroke on MRI  History is obtained from:Patient and chart  HPI: Zachary Hobbs is a 77 y.o. male with history of metastatic small cell lung cancer, hypertension, chronic kidney stage III disease, GERD hyperlipidemia along with lung cancer with mets to the brain.  Patient presented to the hospital chief complaint of diarrhea.  Patient had chemotherapy and immunotherapy on Friday and then this Saturday morning started having intermittent abdominal pain with diffuse cramping in which she saw stools.  While hospitalized patient was having confusion thus a MRI brain was ordered per radiation oncology.  MRI showed acute subcentimeter right parietal and left cerebellar infarcts.  For this reason neurology was consulted.  On consultation patient is awake and alert.  He is able to follow commands.  He knows that he is at the hospital but still has some confusion with multistep commands.  He did have difficulty calling his wife's name.  He denies being in any pain at this point time.  He denies any numbness, tingling, weakness, diplopia, difficulty with lifting or drinking.  In addition GI was consulted secondary to having bloody bowel movements.  At this time they are considering colonoscopy to evaluate for cause of bloody bowel movements.  Patient also has anemia from chronic disease.  He has a hemoglobin of 7.7 which had trended down from 10.1.   LKW: unknown tpa given?: no, unknown Premorbid modified Rankin scale (mRS): 4 NIH stroke score of 2  ROS: A 14 point ROS was performed and is negative except as noted in the HPI.  Obtained by wife  Past Medical History:  Diagnosis Date  . Cancer ([place])    small cell carcinoma lung  . Hypertension      Family History  Problem Relation Age of Onset  . Mesothelioma Sister      Social History:   reports that he has never smoked. He has never used smokeless  tobacco. He reports that he does not drink alcohol or use drugs.  Medications  Current Facility-Administered Medications:  .  0.9 %  sodium chloride infusion, , Intravenous, Continuous, Eugenie Filler, MD, Last Rate: 100 mL/hr at 06/22/18 1604 .  acetaminophen (TYLENOL) tablet 650 mg, 650 mg, Oral, Q4H PRN **OR** acetaminophen (TYLENOL) solution 650 mg, 650 mg, Per Tube, Q4H PRN **OR** acetaminophen (TYLENOL) suppository 650 mg, 650 mg, Rectal, Q4H PRN, Eugenie Filler, MD .  acetaminophen (TYLENOL) tablet 650 mg, 650 mg, Oral, Q6H PRN **OR** acetaminophen (TYLENOL) suppository 650 mg, 650 mg, Rectal, Q6H PRN, Eugenie Filler, MD .  alum & mag hydroxide-simeth (MAALOX/MYLANTA) 200-200-20 MG/5ML suspension 30 mL, 30 mL, Oral, Q4H PRN, Eugenie Filler, MD, 30 mL at 06/19/18 2111 .  bisacodyl (DULCOLAX) suppository 10 mg, 10 mg, Rectal, Once, Eugenie Filler, MD, Stopped at 06/21/18 2003 .  bisacodyl (DULCOLAX) suppository 10 mg, 10 mg, Rectal, Daily PRN, Eugenie Filler, MD .  levETIRAcetam (KEPPRA) tablet 500 mg, 500 mg, Oral, BID, Eugenie Filler, MD, 500 mg at 06/22/18 1128 .  meclizine (ANTIVERT) tablet 50 mg, 50 mg, Oral, TID PRN, Eugenie Filler, MD .  methylPREDNISolone sodium succinate (SOLU-MEDROL) 40 mg/mL injection 20 mg, 20 mg, Intravenous, Q12H, Eugenie Filler, MD, 20 mg at 06/22/18 1128 .  ondansetron (ZOFRAN) tablet 4 mg, 4 mg, Oral, Q6H PRN **OR** ondansetron (ZOFRAN) injection 4 mg, 4 mg, Intravenous, Q6H PRN, Eugenie Filler, MD, 4 mg at  06/20/18 0454 .  oxyCODONE-acetaminophen (PERCOCET/ROXICET) 5-325 MG per tablet 1 tablet, 1 tablet, Oral, Q6H PRN, Eugenie Filler, MD, 1 tablet at 06/22/18 1252 .  pantoprazole (PROTONIX) EC tablet 40 mg, 40 mg, Oral, Daily, Eugenie Filler, MD, 40 mg at 06/22/18 1128 .  peg 3350 powder (MOVIPREP) kit 100 g, 0.5 kit, Oral, Once **AND** peg 3350 powder (MOVIPREP) kit 100 g, 0.5 kit, Oral, Once, Eugenie Filler, MD .  senna-docusate (Senokot-S) tablet 1 tablet, 1 tablet, Oral, QHS PRN, Eugenie Filler, MD .  simvastatin (ZOCOR) tablet 40 mg, 40 mg, Oral, QPM, Eugenie Filler, MD, 40 mg at 06/21/18 1807 .  traMADol (ULTRAM) tablet 25-50 mg, 25-50 mg, Oral, Q12H PRN, Eugenie Filler, MD   Exam: Current vital signs: BP 139/67 (BP Location: Left Arm)   Pulse 82   Temp 97.7 F (36.5 C) (Oral)   Resp 16   Wt 62.1 kg   SpO2 96%   BMI 20.36 kg/m  Vital signs in last 24 hours: Temp:  [97.7 F (36.5 C)-98.8 F (37.1 C)] 97.7 F (36.5 C) (08/22 1600) Pulse Rate:  [79-92] 82 (08/22 1344) Resp:  [14-18] 16 (08/22 1344) BP: (117-139)/(52-78) 139/67 (08/22 1212) SpO2:  [95 %-98 %] 96 % (08/22 1344)  GENERAL: Awake, alert in NAD HEENT: - Normocephalic and atraumatic,  LUNGS - Clear to auscultation bilaterally with no wheezes Ext: warm, well perfused, intact peripheral pulses,   NEURO:  Mental Status: Patient is alert, oriented to hospital, able to name thumb, pinky, unable to recall his wife's name and the year.  His voice seems dysarthric at this time however wife says that this is his normal voice. naming, repetition decreased fluency, and comprehension.  Patient was able to do simple commands such as reaching out touching my finger however when asked to do complex commands that she is taking his left thumb touching his right ear and pointing to the ceiling he could not do this.  He continued to take his left thumb touch his left ear and then could not point to the ceiling. Cranial Nerves: PERRL 2 mm/brisk. EOMI, visual fields full, no facial asymmetry,acial sensation intact, hearing intact, no lingual dysarthria Motor: 5/5 throughout Tone: is normal and bulk is normal Sensation- Intact to light touch bilaterally Coordination: FTN show dysmetria on the left but not the right I did not see any dysmetria with heel-to-shin bilaterally.    Labs I have reviewed labs in epic and the results  pertinent to this consultation are:   CBC    Component Value Date/Time   WBC 2.5 (L) 06/22/2018 0610   RBC 3.23 (L) 06/22/2018 0610   HGB 7.7 (L) 06/22/2018 0610   HGB 9.8 (L) 06/15/2018 1310   HCT 23.7 (L) 06/22/2018 0610   PLT 175 06/22/2018 0610   PLT 400 06/15/2018 1310   MCV 73.4 (L) 06/22/2018 0610   MCH 23.8 (L) 06/22/2018 0610   MCHC 32.5 06/22/2018 0610   RDW 16.6 (H) 06/22/2018 0610   LYMPHSABS 0.6 (L) 06/21/2018 0634   MONOABS 0.1 06/21/2018 0634   EOSABS 0.0 06/21/2018 0634   BASOSABS 0.0 06/21/2018 0634    CMP     Component Value Date/Time   NA 141 06/22/2018 0610   K 4.7 06/22/2018 0610   CL 115 (H) 06/22/2018 0610   CO2 19 (L) 06/22/2018 0610   GLUCOSE 121 (H) 06/22/2018 0610   BUN 53 (H) 06/22/2018 0610   CREATININE 1.74 (H) 06/22/2018 9741  CREATININE 2.25 (H) 06/15/2018 1310   CALCIUM 7.1 (L) 06/22/2018 0610   PROT 5.6 (L) 06/22/2018 0610   ALBUMIN 1.8 (L) 06/22/2018 0610   AST 20 06/22/2018 0610   AST 26 06/15/2018 1310   ALT 36 06/22/2018 0610   ALT 57 (H) 06/15/2018 1310   ALKPHOS 120 06/22/2018 0610   BILITOT 0.9 06/22/2018 0610   BILITOT 1.0 06/15/2018 1310   GFRNONAA 37 (L) 06/22/2018 0610   GFRNONAA 27 (L) 06/15/2018 1310   GFRAA 42 (L) 06/22/2018 0610   GFRAA 31 (L) 06/15/2018 1310    Lipid Panel     Component Value Date/Time   CHOL 218 (H) 05/06/2010 0915   TRIG 87.0 05/06/2010 0915   HDL 43.10 05/06/2010 0915   CHOLHDL 5 05/06/2010 0915   VLDL 17.4 05/06/2010 0915   LDLDIRECT 157.1 05/06/2010 0915     Imaging I have reviewed the images obtained:   MRI examination of the brain IMPRESSION: 1. Moderate to severely motion degraded examination: 4-5 supratentorial intraparenchymal metastasis largest in RIGHT temporal lobe measuring 14 x 15 mm. C3 and C4 suspected osseous metastasis. No pathologic fracture. 2. Acute subcentimeter RIGHT parietal and LEFT cerebellar infarcts. 3. Moderate parenchymal brain volume loss and  moderate chronic small vessel ischemic changes.   Assessment: 77 year old male with 2 punctate infarcts of different vascular distributions.  Likely cardioembolic or secondary to hypercoagulable state due to his cancer.  Currently not on aspirin and awaiting GIs input if patient may be placed back on aspirin.  Once GI is okay with placing patient back on antiplatelet medication, the patient should be on an antiplatelet agent.   Recommendations: #MRA Head  #Carotid ultrasound  #Transthoracic Echo,  # Start patient on ASA 324m daily, if approved by GI # Long term benefits of statin most likely outweighed by near term risks in this patient with metastatic disease given life expectancy  # BP goal: Hold off on permissive HTN as infarctions are punctate with little to no ischemic penumbra # HBAIC and Lipid profile # Telemetry monitoring # Frequent neuro checks # NPO until passes stroke swallow screen # please page stroke NP  Or  PA  Or MD from 8am -4 pm  as this patient from this time will be  followed by the stroke.   You can look them up on [email or web address]  Password [email or web address]  I have seen and examined the patient. I have amended the assessment and recommendations above. Electronically signed: Dr. EKerney Elbe

## 2018-06-22 NOTE — Progress Notes (Signed)
*  PRELIMINARY RESULTS* Vascular Ultrasound Carotid Duplex (Doppler) has been completed.  Findings suggest 1-39% internal carotid artery stenosis bilaterally. Vertebral arteries are patent with antegrade flow.  06/22/2018 10:16 AM Maudry Mayhew, MHA, RVT, RDCS, RDMS

## 2018-06-22 NOTE — Care Management Note (Signed)
Case Management Note  Patient Details  Name: Zachary Hobbs MRN: [id number] Date of Birth: 04/24/1941  Subjective/Objective:                  Assessment: 1.  Diarrhea: Only 3 episodes yesterday but this was slightly looser than the day before per his wife, again considering most likely drug induced diarrhea, ruling out infectious etiologies 2.  Small cell lung cancer currently undergoing chemotherapy and immunotherapy 3.  Microcytic anemia: hgb 8.7-->7.7 overnight, brb in stool reported yesterday 4.  AKI on CKD stage III  Plan: 1.  Continue to observe for further GI bleeding with monitoring of hemoglobin and transfusion as needed less than 7 2.  Again patient will continue on Methylprednisolone 20 mg IV twice daily for now 3.  Apparently GI pathogen panel had not been ordered, did order this this morning and patient is on enteric precautions now 4.  Discussed with patient's wife that pending results of above, would recommend a colonoscopy if patient continues with symptoms 5.  Please await further recommendations from Dr. Rush Landmark later today   Action/Plan: Following for progression of care and results Following for cm-needs-none present at this time.  Expected Discharge Date:  (unknown)               Expected Discharge Plan:     In-House Referral:     Discharge planning Services     Post Acute Care Choice:    Choice offered to:     DME Arranged:    DME Agency:     HH Arranged:    HH Agency:     Status of Service:     If discussed at [employer], dates discussed:    Additional Comments:  Leeroy Cha, RN 06/22/2018, 10:47 AM

## 2018-06-22 NOTE — Progress Notes (Signed)
PROGRESS NOTE    Zachary Hobbs  QIO:[id number] DOB: 31-Dec-1940 DOA: 06/19/2018 PCP: Janie Morning, DO    Brief Narrative:  Pt. with PMH of metastatic small cell lung cancer, hypertension, CKD stage III, GERD, hyperlipidemia, anemia; admitted on 06/19/2018, presented with complaint of diarrhea, was found to have aki . Currently further plan is continue IV hydration .   Assessment & Plan:   Active Problems:   HLD (hyperlipidemia)   Essential hypertension   Malignant neoplasm of upper lobe of left lung (HCC)   Diarrhea   GERD (gastroesophageal reflux disease)   Microcytic anemia   Acute kidney injury superimposed on chronic kidney disease (HCC)   Hypermagnesemia   Elevated AST (SGOT)   Confusion   Lower GI bleed   Constipation   Acute encephalopathy   Acute CVA (cerebrovascular accident) ([place])  #1??  Diarrhea with abdominal tenderness likely secondary to constipation versus immune mediated/chemotherapy mediated Patient noted on CT abdomen and pelvis formed stool and contrast medium in the distal colon with no dilated bowel and no significant bowel wall thickening to suggest colitis.  Patient with bloody bowel movements on 06/20/2018 and 06/21/2018.  Per wife no further bloody bowel movements and consistency of stool is improving.  C. difficile PCR was canceled.  GI pathogen panel has been reordered per gastroenterology.  Due to concern for inflammatory diarrhea patient has been started on IV methylprednisone instead of oral prednisone per gastroenterology.  GI following and I appreciate the input and recommendations..   2.  Bloody bowel movement/probable lower GI bleed Patient with bloody bowel movements per RN and per wife.  Hemoglobin currently at 7.7 from 8.7 from 9.9 on admission.  No further bloody bowel movements this morning per wife.  Patient with a history of metastatic lung cancer with bone mets and brain mets.  Patient also with a prior history of colonoscopy in 2011 with a 3  mm sessile polyp noted removed piecemeal with pathology consistent with tubular adenoma.  Patient also noted to have 2 other polyps with biopsies positive for tubular adenoma.  Patient now with bloody bowel movement.  Continue PPI.  GI consulted who are monitoring and patient may need colonoscopy.  Will defer to GI.  3.  Acute CVA Noted on MRI of head done on 06/21/2018.  Patient with lung cancer with mets to the brain and bone.  Patient also with probable hypercoagulable state.  MRI brain with an acute subcentimeter right parietal and left cerebellar infarcts in addition to metastatic disease and moderate parenchymal brain volume loss.  Patient with concerns for bloody bowel movements and as such unable to place on aspirin for stroke prophylaxis.  GI bleed work-up underway and patient for colonoscopy per gastroenterology.  Stroke work-up underway with 2D echo pending, carotid Dopplers, MRA of the head.  Check a fasting lipid panel.  Consult with neurology for further evaluation and management.  4.  Anemia Anemia panel consistent with anemia of chronic disease.  Hemoglobin trending further down currently at 7.7 from 9.9 on admission and from 10.1 on 06/02/2018.  Follow H&H.  Transfusion threshold hemoglobin less than 7.  GI following.   5.  Lung cancer with metastases to the brain and bone Patient being followed by Dr. Lorna Few with recent chemotherapy and immunotherapy.  Patient started on empiric steroids for possible immune mediated colitis.  Dr. Lorna Few was notified and per Dr. Posey Pronto will assess the patient.  Patient currently receiving radiation treatment.  Due to patient's confusion CT  head was ordered however MRI of the brain has been ordered per radiation oncology and as such CT head was discontinued.  MRI head concerning for metastatic disease and acute CVA.  Patient started on Keppra for seizure prophylaxis.  Patient receiving radiation treatments.  6.  Hypertension Stable.   Valsartan/HCTZ on hold secondary to presentation with diarrhea and lower blood pressure.  Permissive hypertension as patient now noted to have an acute CVA.  Follow.  7.  Gastroesophageal reflux disease Continue PPI.  8.  Hyperlipidemia Continue statin.  9.  Acute kidney injury on chronic kidney disease stage III Likely secondary to a prerenal azotemia in the setting of ARB and diuretic.  Antihypertensive medications on hold.  Renal function trending down with hydration.  Follow.    10.  Acute metabolic encephalopathy Likely secondary to brain mets and dehydration and acute CVA noted on MRI brain which was done 06/21/2018.  Patient with clinical improvement.  Neurology consulted.  11.  Hypermagnesemia Slowly trending down.  Magnesium magnesium level at 2.4.  Follow.    DVT prophylaxis: SCDs. Code Status: Full Family Communication: Updated patient and wife at bedside. Disposition Plan: Transfer to stepdown unit for acute 2-hour vital signs and monitoring for acute CVA.  Likely need skilled nursing facility once medically stable.    Consultants:   Oncology via epic, Dr. Lorna Few  Gastroenterology: Dr. Rush Landmark 06/21/2018  Neurology pending  Procedures:   CT abdomen and pelvis 06/19/2018  MRI brain 06/21/2018  Carotid Dopplers pending 06/22/2018  2D echo pending 06/22/2018  MRA pending 06/22/2018  Antimicrobials:   None   Subjective: Patient more alert and oriented to self place.  Patient notes it is 2019 however thinks it is June.  No chest pain.  No shortness of breath.  Per wife no further bloody bowel movements.    Objective: Vitals:   06/21/18 1439 06/21/18 2104 06/22/18 0340 06/22/18 0920  BP: 139/78 136/75 137/78 124/71  Pulse: 85 80 84 82  Resp: 14 16 18 14   Temp: (!) 97.5 F (36.4 C) 98.2 F (36.8 C) 98 F (36.7 C) 98.2 F (36.8 C)  TempSrc:    Oral  SpO2: 97% 98% 97% 96%  Weight:        Intake/Output Summary (Last 24 hours) at 06/22/2018  1034 Last data filed at 06/22/2018 0600 Gross per 24 hour  Intake 3150.74 ml  Output -  Net 3150.74 ml   Filed Weights   06/20/18 0604  Weight: 62.1 kg    Examination:  General exam: Appears calm and comfortable  Respiratory system: Lungs clear to auscultation bilaterally.  No wheezes, no crackles, no rhonchi.  Normal respiratory effort Cardiovascular system: Regular rate and rhythm with a grade 3 out of 6 systolic ejection murmur.  No lower extremity edema.  No JVD.   Gastrointestinal system: Abdomen is soft, nontender, nondistended, positive bowel sounds.  No rebound.  No guarding.  Central nervous system: Alert and oriented.  Cranial nerves II through XII grossly intact.  No focal deficits.  Sensation intact.  Gait not tested secondary to safety.  Extremities: Symmetric 5 x 5 power. Skin: No rashes, lesions or ulcers Psychiatry: Judgement and insight appear normal. Mood & affect appropriate.     Data Reviewed: I have personally reviewed following labs and imaging studies  CBC: Recent Labs  Lab 06/15/18 1310 06/19/18 1014 06/20/18 0614 06/21/18 0634 06/22/18 0610  WBC 9.7 8.6 6.0 4.6 2.5*  NEUTROABS 7.8*  --   --  3.9  --  HGB 9.8* 9.9* 8.6* 8.7* 7.7*  HCT 30.7* 31.0* 27.1* 27.6* 23.7*  MCV 74.0* 73.6* 73.4* 74.0* 73.4*  PLT 400 285 246 218 503   Basic Metabolic Panel: Recent Labs  Lab 06/15/18 1310 06/19/18 1014 06/20/18 0614 06/21/18 0634 06/22/18 0610  NA 139 143 142 143 141  K 5.2* 4.5 4.5 4.5 4.7  CL 103 110 110 114* 115*  CO2 19* 21* 22 19* 19*  GLUCOSE 94 119* 132* 104* 121*  BUN 110* 95* 79* 60* 53*  CREATININE 2.25* 2.36* 2.01* 1.80* 1.74*  CALCIUM 11.3* 8.9 7.8* 7.5* 7.1*  MG  --  2.7* 2.5* 2.5* 2.4  PHOS  --   --  3.4  --   --    GFR: Estimated Creatinine Clearance: 32.2 mL/min (A) (by C-G formula based on SCr of 1.74 mg/dL (H)). Liver Function Tests: Recent Labs  Lab 06/15/18 1310 06/19/18 1014 06/20/18 0614 06/21/18 0634  06/22/18 0610  AST 26 30 24 21 20   ALT 57* 69* 51* 42 36  ALKPHOS 198* 184* 143* 132* 120  BILITOT 1.0 1.2 0.8 1.1 0.9  PROT 8.2* 7.2 6.1* 6.0* 5.6*  ALBUMIN 2.3* 2.4* 2.0* 1.9* 1.8*   Recent Labs  Lab 06/19/18 1014  LIPASE 20   No results for input(s): AMMONIA in the last 168 hours. Coagulation Profile: No results for input(s): INR, PROTIME in the last 168 hours. Cardiac Enzymes: No results for input(s): CKTOTAL, CKMB, CKMBINDEX, TROPONINI in the last 168 hours. BNP (last 3 results) No results for input(s): PROBNP in the last 8760 hours. HbA1C: No results for input(s): HGBA1C in the last 72 hours. CBG: Recent Labs  Lab 06/20/18 0500 06/21/18 0824 06/22/18 0825  GLUCAP 122* 88 100*   Lipid Profile: No results for input(s): CHOL, HDL, LDLCALC, TRIG, CHOLHDL, LDLDIRECT in the last 72 hours. Thyroid Function Tests: Recent Labs    06/20/18 0614  TSH 0.573   Anemia Panel: Recent Labs    06/21/18 0634  VITAMINB12 1,515*  FOLATE 4.9*  FERRITIN 2,231*  TIBC 148*  IRON 91  RETICCTPCT <0.4*   Sepsis Labs: No results for input(s): PROCALCITON, LATICACIDVEN in the last 168 hours.  Recent Results (from the past 240 hour(s))  Culture, Urine     Status: None   Collection Time: 06/19/18  2:16 PM  Result Value Ref Range Status   Specimen Description   Final    URINE, CLEAN CATCH Performed at [facility] [address], [place], [place] [address]    Special Requests   Final    NONE Performed at [facility], [place] [address], [place], [place] [address]    Culture   Final    NO GROWTH Performed at [facility], [place] [address], [place], [place] [address]    Report Status 06/20/2018 FINAL  Final         Radiology Studies: Mr Jeri Cos AV Contrast  Result Date: 06/21/2018 CLINICAL DATA:  Follow-up metastatic lung cancer. EXAM: MRI HEAD WITHOUT AND WITH CONTRAST TECHNIQUE: Multiplanar, multiecho pulse sequences of the  brain and surrounding structures were obtained without and with intravenous contrast. CONTRAST:  61mL MULTIHANCE GADOBENATE DIMEGLUMINE 529 MG/ML IV SOLN COMPARISON:  Noncontrast MRI head June 07, 2018 FINDINGS: Sequences vary from moderate to severely motion degraded. Motion degraded examination. Patient received Percocet. BRAIN Lesions: At least 4: 14 x 15 mm enhancing lesion located RIGHT temporal lobe with mild surrounding vasogenic edema and and no significant mass effect  seen on series 12, image 62. 8 x 9 mm enhancing lesion located LEFT frontal periventricular white matter without significant vasogenic edema or mass effect seen on series 12, image 98. 6 x 6 mm enhancing lesion located LEFT frontal convexity cortex with proportional vasogenic edema, no significant mass effect seen on series 12, image 126. 7 x 10 mm enhancing lesion located LEFT parietal convexity cortex with proportional vasogenic edema, no significant mass effect seen on series 12, image 130. 4 mm enhancing lesion located RIGHT mesial temporal lobe without vasogenic edema or mass effect may be artifact only seen on series 17, image 33. Other Brain findings: INTRACRANIAL CONTENTS: RIGHT cerebellar developmental venous anomaly. Subcentimeter reduced diffusion LEFT cerebellum with low ADC values. Subcentimeter reduced diffusion RIGHT parietal cortex with low ADC values. No susceptibility artifact to suggest hemorrhage. Patchy to confluent supratentorial white matter FLAIR T2 hyperintensities exclusive aforementioned abnormalities. Moderate parenchymal brain volume loss. No hydrocephalus. RIGHT cerebellar developmental venous anomaly. No abnormal extra-axial fluid collections. Enhancement along the anterior falx. VASCULAR: Normal major intracranial vascular flow voids present at skull base. SKULL AND UPPER CERVICAL SPINE: No abnormal sellar expansion. Abnormally decreased bone marrow signal and faint enhancement C3 and C4 vertebral bodies  concerning for osseous metastasis. Craniocervical junction maintained. SINUSES/ORBITS: The mastoid air-cells and included paranasal sinuses are well-aerated.The included ocular globes and orbital contents are non-suspicious. OTHER: None. IMPRESSION: 1. Moderate to severely motion degraded examination: 4-5 supratentorial intraparenchymal metastasis largest in RIGHT temporal lobe measuring 14 x 15 mm. C3 and C4 suspected osseous metastasis. No pathologic fracture. 2. Acute subcentimeter RIGHT parietal and LEFT cerebellar infarcts. 3. Moderate parenchymal brain volume loss and moderate chronic small vessel ischemic changes. 4. These results will be called to the ordering clinician or representative by the Radiologist Assistant, and communication documented in the PACS or zVision Dashboard. Electronically Signed   By: Elon Alas M.D.   On: 06/21/2018 23:05        Scheduled Meds: .  stroke: mapping our early stages of recovery book   Does not apply Once  . bisacodyl  10 mg Rectal Once  . levETIRAcetam  500 mg Oral BID  . methylPREDNISolone (SOLU-MEDROL) injection  20 mg Intravenous Q12H  . pantoprazole  40 mg Oral Daily  . simvastatin  40 mg Oral QPM   Continuous Infusions: . sodium chloride 100 mL/hr at 06/22/18 0624     LOS: 3 days    Time spent: 40 minutes    Irine Seal, MD [facility] Pager [phone]  If 7PM-7AM, please contact night-coverage [email or web address] Password [facility] 06/22/2018, 10:34 AM

## 2018-06-23 ENCOUNTER — Ambulatory Visit
Admission: RE | Admit: 2018-06-23 | Discharge: 2018-06-23 | Disposition: A | Discharge status: Home / Self Care | Payer: 59 | Source: Ambulatory Visit | Attending: Radiation Oncology | Admitting: Radiation Oncology

## 2018-06-23 ENCOUNTER — Ambulatory Visit: Payer: 59

## 2018-06-23 ENCOUNTER — Telehealth: Payer: Self-pay | Admitting: Gastroenterology

## 2018-06-23 ENCOUNTER — Encounter (HOSPITAL_COMMUNITY)
Admission: EM | Disposition: A | Discharge status: Hospice | Payer: Self-pay | Source: Home / Self Care | Attending: Internal Medicine

## 2018-06-23 ENCOUNTER — Encounter: Payer: Self-pay | Admitting: Radiation Oncology

## 2018-06-23 ENCOUNTER — Ambulatory Visit: Admission: RE | Admit: 2018-06-23 | Payer: 59 | Source: Ambulatory Visit | Admitting: Radiation Oncology

## 2018-06-23 DIAGNOSIS — E860 Dehydration: Secondary | ICD-10-CM | POA: Diagnosis present

## 2018-06-23 DIAGNOSIS — L8990 Pressure ulcer of unspecified site, unspecified stage: Secondary | ICD-10-CM | POA: Diagnosis present

## 2018-06-23 LAB — GASTROINTESTINAL PANEL BY PCR, STOOL (REPLACES STOOL CULTURE)

## 2018-06-23 LAB — CBC WITH DIFFERENTIAL/PLATELET
BASOS ABS: 0 10*3/uL (ref 0.0–0.1)
Basophils Relative: 0 %
EOS PCT: 0 %
Eosinophils Absolute: 0 10*3/uL (ref 0.0–0.7)
HEMATOCRIT: 24.9 % — AB (ref 39.0–52.0)
HEMOGLOBIN: 7.9 g/dL — AB (ref 13.0–17.0)
LYMPHS ABS: 0.4 10*3/uL — AB (ref 0.7–4.0)
LYMPHS PCT: 18 %
MCH: 23.5 pg — AB (ref 26.0–34.0)
MCHC: 31.7 g/dL (ref 30.0–36.0)
MCV: 74.1 fL — AB (ref 78.0–100.0)
Monocytes Absolute: 0.1 10*3/uL (ref 0.1–1.0)
Monocytes Relative: 6 %
NEUTROS ABS: 1.6 10*3/uL — AB (ref 1.7–7.7)
NEUTROS PCT: 76 %
Platelets: 160 10*3/uL (ref 150–400)
RBC: 3.36 MIL/uL — ABNORMAL LOW (ref 4.22–5.81)
RDW: 16.7 % — ABNORMAL HIGH (ref 11.5–15.5)
WBC: 2.1 10*3/uL — AB (ref 4.0–10.5)

## 2018-06-23 LAB — LIPID PANEL
CHOL/HDL RATIO: 5.3 ratio
Cholesterol: 171 mg/dL (ref 0–200)
HDL: 32 mg/dL — ABNORMAL LOW (ref >40)
LDL Cholesterol: 101 mg/dL — ABNORMAL HIGH (ref 0–99)
Triglycerides: 192 mg/dL — ABNORMAL HIGH (ref <150)
VLDL: 38 mg/dL (ref 0–40)

## 2018-06-23 LAB — GLUCOSE, CAPILLARY: Glucose-Capillary: 76 mg/dL (ref 70–99)

## 2018-06-23 LAB — BASIC METABOLIC PANEL
ANION GAP: 8 (ref 5–15)
BUN: 40 mg/dL — AB (ref 8–23)
CHLORIDE: 116 mmol/L — AB (ref 98–111)
CO2: 18 mmol/L — AB (ref 22–32)
Calcium: 6.8 mg/dL — ABNORMAL LOW (ref 8.9–10.3)
Creatinine, Ser: 1.73 mg/dL — ABNORMAL HIGH (ref 0.61–1.24)
GFR calc Af Amer: 43 mL/min — ABNORMAL LOW (ref >60)
GFR calc non Af Amer: 37 mL/min — ABNORMAL LOW (ref >60)
GLUCOSE: 89 mg/dL (ref 70–99)
Potassium: 4.6 mmol/L (ref 3.5–5.1)
Sodium: 142 mmol/L (ref 135–145)

## 2018-06-23 LAB — HEMOGLOBIN A1C
HEMOGLOBIN A1C: 6.3 % — AB (ref 4.8–5.6)
MEAN PLASMA GLUCOSE: 134.11 mg/dL

## 2018-06-23 SURGERY — COLONOSCOPY WITH PROPOFOL
Anesthesia: Monitor Anesthesia Care

## 2018-06-23 NOTE — Progress Notes (Signed)
Progress Note   Subjective  Chief Complaint: Diarrhea, hematochezia  Per nursing staff patient was unable to complete prep as he started vomiting.  He and his wife explained today that it is just "too much volume".  The patient does not want to pursue a colonoscopy even after discussion of NG tube placement.  He has been moved to the ICU overnight for concerns of stroke.  Nursing staff explains patient did have a stool this morning which was not watery and did have slight formed more of a "mush" which was sent for a GI pathogen panel. No further hematochezia   Objective   Vital signs in last 24 hours: Temp:  [97.7 F (36.5 C)-98.9 F (37.2 C)] 98 F (36.7 C) (08/23 0800) Pulse Rate:  [72-93] 90 (08/23 0900) Resp:  [15-22] 18 (08/23 0900) BP: (117-153)/(52-85) 153/80 (08/23 0900) SpO2:  [93 %-99 %] 97 % (08/23 0900) Weight:  [62.8 kg-67.1 kg] 62.8 kg (08/23 0500) Last BM Date: 06/21/18 General:    AA male in NAD Heart:  Regular rate and rhythm; no murmurs Lungs: Respirations even and unlabored, lungs CTA bilaterally Abdomen:  Soft, nontender and nondistended. Normal bowel sounds. Extremities:  Without edema. Neurologic:  Alert and oriented,  grossly normal neurologically. Psych:  Cooperative. Normal mood and affect.  Intake/Output from previous day: 08/22 0701 - 08/23 0700 In: 1286.4 [I.V.:1286.4] Out: 405 [Urine:405]  Lab Results: Recent Labs    06/21/18 0634 06/22/18 0610 06/23/18 0334  WBC 4.6 2.5* 2.1*  HGB 8.7* 7.7* 7.9*  HCT 27.6* 23.7* 24.9*  PLT 218 175 160   BMET Recent Labs    06/21/18 0634 06/22/18 0610 06/23/18 0334  NA 143 141 142  K 4.5 4.7 4.6  CL 114* 115* 116*  CO2 19* 19* 18*  GLUCOSE 104* 121* 89  BUN 60* 53* 40*  CREATININE 1.80* 1.74* 1.73*  CALCIUM 7.5* 7.1* 6.8*   LFT Recent Labs    06/22/18 0610  PROT 5.6*  ALBUMIN 1.8*  AST 20  ALT 36  ALKPHOS 120  BILITOT 0.9    Studies/Results: Dg Chest 2 View  Result Date:  06/22/2018 CLINICAL DATA:  Stroke.  Hypertension.  Lung cancer. EXAM: CHEST - 2 VIEW COMPARISON:  06/02/2018 FINDINGS: Mild cardiomegaly. Small effusions in the posterior costophrenic angles. Progression of intrathoracic metastatic disease. Largest mass in the left lateral mid chest now measures 6.2 cm as opposed to 5.7 cm previously. Innumerable metastatic nodules throughout both lungs as seen previously. IMPRESSION: Small effusions in the posterior costophrenic angles. Enlargement of the dominant mass density in the left lateral chest. Innumerable widespread bilateral pulmonary metastatic nodules. Electronically Signed   By: Nelson Chimes M.D.   On: 06/22/2018 10:35   Mr Jeri Cos UX Contrast  Result Date: 06/21/2018 CLINICAL DATA:  Follow-up metastatic lung cancer. EXAM: MRI HEAD WITHOUT AND WITH CONTRAST TECHNIQUE: Multiplanar, multiecho pulse sequences of the brain and surrounding structures were obtained without and with intravenous contrast. CONTRAST:  31mL MULTIHANCE GADOBENATE DIMEGLUMINE 529 MG/ML IV SOLN COMPARISON:  Noncontrast MRI head June 07, 2018 FINDINGS: Sequences vary from moderate to severely motion degraded. Motion degraded examination. Patient received Percocet. BRAIN Lesions: At least 4: 14 x 15 mm enhancing lesion located RIGHT temporal lobe with mild surrounding vasogenic edema and and no significant mass effect seen on series 12, image 62. 8 x 9 mm enhancing lesion located LEFT frontal periventricular white matter without significant vasogenic edema or mass effect seen on series 12, image 98.  6 x 6 mm enhancing lesion located LEFT frontal convexity cortex with proportional vasogenic edema, no significant mass effect seen on series 12, image 126. 7 x 10 mm enhancing lesion located LEFT parietal convexity cortex with proportional vasogenic edema, no significant mass effect seen on series 12, image 130. 4 mm enhancing lesion located RIGHT mesial temporal lobe without vasogenic edema or mass  effect may be artifact only seen on series 17, image 33. Other Brain findings: INTRACRANIAL CONTENTS: RIGHT cerebellar developmental venous anomaly. Subcentimeter reduced diffusion LEFT cerebellum with low ADC values. Subcentimeter reduced diffusion RIGHT parietal cortex with low ADC values. No susceptibility artifact to suggest hemorrhage. Patchy to confluent supratentorial white matter FLAIR T2 hyperintensities exclusive aforementioned abnormalities. Moderate parenchymal brain volume loss. No hydrocephalus. RIGHT cerebellar developmental venous anomaly. No abnormal extra-axial fluid collections. Enhancement along the anterior falx. VASCULAR: Normal major intracranial vascular flow voids present at skull base. SKULL AND UPPER CERVICAL SPINE: No abnormal sellar expansion. Abnormally decreased bone marrow signal and faint enhancement C3 and C4 vertebral bodies concerning for osseous metastasis. Craniocervical junction maintained. SINUSES/ORBITS: The mastoid air-cells and included paranasal sinuses are well-aerated.The included ocular globes and orbital contents are non-suspicious. OTHER: None. IMPRESSION: 1. Moderate to severely motion degraded examination: 4-5 supratentorial intraparenchymal metastasis largest in RIGHT temporal lobe measuring 14 x 15 mm. C3 and C4 suspected osseous metastasis. No pathologic fracture. 2. Acute subcentimeter RIGHT parietal and LEFT cerebellar infarcts. 3. Moderate parenchymal brain volume loss and moderate chronic small vessel ischemic changes. 4. These results will be called to the ordering clinician or representative by the Radiologist Assistant, and communication documented in the PACS or zVision Dashboard. Electronically Signed   By: Elon Alas M.D.   On: 06/21/2018 23:05   Mr Jodene Nam Head Wo Contrast  Result Date: 06/22/2018 CLINICAL DATA:  Initial evaluation for acute confusion. Recently identified infarcts with known metastatic disease. EXAM: MRA HEAD WITHOUT CONTRAST  TECHNIQUE: Angiographic images of the Circle of Willis were obtained using MRA technique without intravenous contrast. COMPARISON:  Previous brain MRI from 06/21/2018. FINDINGS: ANTERIOR CIRCULATION: Distal cervical segments of the internal carotid arteries are patent with antegrade flow. Petrous, cavernous, and supraclinoid segments widely patent without stenosis. A1 segments widely patent. Normal anterior communicating artery. Anterior cerebral arteries widely patent to their distal aspects without stenosis. Short-segment mild stenosis at the proximal right M1 segment (series 303, image 9). M1 segments otherwise widely patent bilaterally. No proximal M2 occlusion. Distal MCA branches well perfused and fairly symmetric. POSTERIOR CIRCULATION: Vertebral arteries widely patent to the vertebrobasilar junction. Partially visualized posterior inferior cerebral arteries patent bilaterally. Basilar widely patent to its distal aspect. Superior cerebral arteries patent bilaterally. Both of the posterior cerebral artery supplied via the basilar. Mild atheromatous irregularity within the PCAs bilaterally without significant stenosis. PCAs are widely patent to their distal aspects. No intracranial aneurysm or other vascular abnormality. IMPRESSION: 1. Negative intracranial MRA with no large vessel occlusion identified. 2. Short-segment mild proximal right M1 stenosis. 3. Additional minor atherosclerotic change for patient age. No other hemodynamically significant or correctable stenosis identified. Electronically Signed   By: Jeannine Boga M.D.   On: 06/22/2018 19:47       Assessment / Plan:   Assessment: 1.  Diarrhea: Stool is now more of a "mush", and much decrease in frequency since time of admission, likely steroids are helping, no further hematochezia; most likely medication induced as previously discussed, though infectious work-up is pending 2.  Small cell lung cancer currently undergoing chemotherapy 3.  Microcytic anemia: Hemoglobin stable 4.  AKI on CKD stage III  Plan: 1.  GI pathogen panel is pending-sample given today 2.  Patient is unable to undergo prep for colonoscopy due to vomiting such a "large volume".  He is uninterested in pursuing colonoscopy any further. 3.  Continue Methylprednisolone 20 mg IV twice daily 4.  We will likely sign off today, if diarrhea continues or patient has further hematochezia causing iron deficiency anemia please let us know.  Thank you for your kind consultation, please await any final recommendations from Dr. Rush Landmark later today.   LOS: 4 days   Levin Erp  06/23/2018, 10:40 AM

## 2018-06-23 NOTE — Care Management Note (Signed)
Case Management Note  Patient Details  Name: Zachary Hobbs MRN: [id number] Date of Birth: 12-07-1940  Subjective/Objective:                  77 y.o. male with history of metastatic small cell lung cancer, hypertension, chronic kidney stage III disease, GERD hyperlipidemia along with lung cancer with mets to the brain.  Patient presented to the hospital chief complaint of diarrhea.  Patient had chemotherapy and immunotherapy on Friday and then this Saturday morning started having intermittent abdominal pain with diffuse cramping in which she saw stools.  While hospitalized patient was having confusion thus a MRI brain was ordered per radiation oncology.  MRI showed acute subcentimeter right parietal and left cerebellar infarcts.  For this reason neurology was consulted.  On consultation patient is awake and alert.  He is able to follow commands.  He knows that he is at the hospital but still has some confusion with multistep commands.  He did have difficulty calling his wife's name.  He denies being in any pain at this point time.  He denies any numbness, tingling, weakness, diplopia, difficulty with lifting or drinking.  In addition GI was consulted secondary to having bloody bowel movements.  At this time they are considering colonoscopy to evaluate for cause of bloody bowel movements.  Patient also has anemia from chronic disease.  He has a hemoglobin of 7.7 which had trended down from 10.1.  Action/Plan: Following for progression pt to have colonoscopy [id number]/vomited most of the constraat may not be able to do. Following for cm needs  Expected Discharge Date:  (unknown)               Expected Discharge Plan:     In-House Referral:     Discharge planning Services     Post Acute Care Choice:    Choice offered to:     DME Arranged:    DME Agency:     HH Arranged:    HH Agency:     Status of Service:     If discussed at [employer], dates discussed:     Additional Comments:  Leeroy Cha, RN 06/23/2018, 9:59 AM

## 2018-06-23 NOTE — Evaluation (Signed)
Speech Language Pathology Evaluation Patient Details Name: Zachary Hobbs MRN: [id number] DOB: 10/02/1941 Today's Date: 06/23/2018 Time: 1443-1540 SLP Time Calculation (min) (ACUTE ONLY): 33 min  Problem List:  Patient Active Problem List   Diagnosis Date Noted  . Pressure injury of skin 06/23/2018  . Acute CVA (cerebrovascular accident) ([place]) 06/22/2018  . Lower GI bleed 06/21/2018  . Constipation 06/21/2018  . Acute encephalopathy   . Diarrhea 06/19/2018  . GERD (gastroesophageal reflux disease) 06/19/2018  . Microcytic anemia 06/19/2018  . Acute kidney injury superimposed on chronic kidney disease ([place]) 06/19/2018  . Hypermagnesemia 06/19/2018  . Elevated AST (SGOT) 06/19/2018  . Confusion 06/19/2018  . Goals of care, counseling/discussion 06/09/2018  . Encounter for antineoplastic chemotherapy 06/09/2018  . Encounter for antineoplastic immunotherapy 06/09/2018  . Malignant neoplasm of upper lobe of left lung ([place]) 06/08/2018  . Abscess, peritonsillar 02/12/2012  . Acute respiratory failure with hypoxia ([place]) 02/12/2012  . Hyperglycemia 02/12/2012  . ERECTILE DYSFUNCTION 12/10/2010  . HEADACHE 08/11/2010  . COLONIC POLYPS, ADENOMATOUS 05/18/2010  . ANEMIA, SECONDARY TO ACUTE BLOOD LOSS 05/18/2010  . COLONIC POLYPS, ADENOMATOUS, HX OF 05/18/2010  . ERECTILE DYSFUNCTION, ORGANIC 05/06/2010  . HLD (hyperlipidemia) 02/18/2010  . ANXIETY 02/18/2010  . GLAUCOMA 02/18/2010  . Essential hypertension 02/18/2010   Past Medical History:  Past Medical History:  Diagnosis Date  . Cancer ([place])    small cell carcinoma lung  . Hypertension    Past Surgical History:  Past Surgical History:  Procedure Laterality Date  . COLONOSCOPY     HPI:  77 yo adm with diarrhea - Pt PMH + for lung cancer - with recent chemo and immunotherapy - after which he got diarrhea.  Pt PMH + for HDL, CKD, HTN.  During hospital coarse pt with AMS - found to have right parietal and left cerebellum cva.  Speech eval ordered.  Spouse present during testing who reports she manages "all in house" chores and pt does "outside chores".     Assessment / Plan / Recommendation Clinical Impression  Pt presents with cognitive linguisic difficulties most notably in areas of attention for complex information, awareness, memory and orientation.  Language skills for basic function at intact - Repetition skills are excellent and pt is able to verbalize his needs without difficulties.   As his wife is generally present, he has been asking her for assistance.  SLP advised wife to have pt use the call bell to request assist to maximize his safety/independence in current environment  - to which she agreed.  Pt jokes frequently and closes his eyes during session - ? covering for difficulties and possible disengagment.   Pt has support needed in current environment for his care - Brief follow up at SNF may be helpful to maximize cognitive linguistic functions.  Thanks for this consult.        SLP Assessment  SLP Recommendation/Assessment: All further Speech Lanaguage Pathology  needs can be addressed in the next venue of care SLP Visit Diagnosis: Cognitive communication deficit (R41.841)    Follow Up Recommendations  Skilled Nursing facility    Frequency and Duration     n/a      SLP Evaluation Cognition  Overall Cognitive Status: Impaired/Different from baseline Arousal/Alertness: Awake/alert Orientation Level: Oriented to person;Oriented to place;Disoriented to time;Disoriented to situation(states current year is 2009 but self corrected, current year with choice of 2) Attention: Sustained Sustained Attention: Impaired Sustained Attention Impairment: Verbal complex Memory: Impaired Memory Impairment: Decreased short term memory(did not  recall he has been informed that he has a stroke) Awareness: Impaired Problem Solving: (basic, pt able to state need to use call bell) Behaviors: Other (comment)(pt closed his  eyes frequently during exam) Safety/Judgment: Impaired Comments: pt stated he could get up if he needed something - advised to increased fall risk, decreased understanding of physical weakness       Comprehension  Auditory Comprehension Overall Auditory Comprehension: Impaired Yes/No Questions: Not tested Commands: Impaired One Step Basic Commands: 75-100% accurate Two Step Basic Commands: 75-100% accurate Multistep Basic Commands: 50-74% accurate Other Conversation Comments: pt wih some confusion re: conversation around his care and treatment, stating"I'm worried about the kids" - suspect is speaking regarding personal life/family and not staffing EffectiveTechniques: Repetition Visual Recognition/Discrimination Discrimination: Not tested Reading Comprehension Reading Status: Not tested    Expression Expression Primary Mode of Expression: Verbal Verbal Expression Overall Verbal Expression: Appears within functional limits for tasks assessed Initiation: No impairment Level of Generative/Spontaneous Verbalization: Conversation Repetition: No impairment Naming: Impairment(for pictures of animals- 3/3 correctly named) Other Naming Comments: named 10 words starting with S in 60 seconds Pragmatics: (pt tends to joke and/or keep his eyes closed during session, ? decreased participation with sadness/behavioral?) Interfering Components: Attention Other Verbal Expression Comments: pt able to verbalize his needs without delay Written Expression Dominant Hand: Right Written Expression: Exceptions to WFL(pt with decreased legibility for his name and wrote his first name twice-demonstrated awareness to this occuring- wife reports worse than normal)   Oral / Motor  Oral Motor/Sensory Function Overall Oral Motor/Sensory Function: Mild impairment Facial ROM: Within Functional Limits Facial Symmetry: Within Functional Limits Facial Strength: Within Functional Limits Lingual ROM: Within  Functional Limits Lingual Strength: Reduced;Other (Comment)(slight lingual deviation to left upon protrusion) Velum: Within Functional Limits Motor Speech Overall Motor Speech: Appears within functional limits for tasks assessed Respiration: Within functional limits Phonation: Normal Resonance: Within functional limits Articulation: Impaired Level of Impairment: Sentence Intelligibility: Intelligible Motor Planning: Witnin functional limits Motor Speech Errors: Not applicable Effective Techniques: Slow rate(at times pt with rapid rate of speech, cues to slow effective)   GO                    Zachary Hobbs 06/23/2018, 12:56 PM Luanna Salk, [facility] SLP [phone]

## 2018-06-23 NOTE — NC FL2 (Signed)
[place] LEVEL OF CARE SCREENING TOOL     IDENTIFICATION  Patient Name: Zachary Hobbs Birthdate: 1941/03/18 Sex: male Admission Date (Current Location): 06/19/2018  [facility] and [place] Number:  Herbalist and Address:  [facility],  [place] [place], [place]      Provider Number: [id number]  Attending Physician Name and Address:  Eugenie Filler, MD  Relative Name and Phone Number:       Current Level of Care: Hospital Recommended Level of Care: [place] Prior Approval Number:    Date Approved/Denied:   PASRR Number: [id number]  Discharge Plan: SNF    Current Diagnoses: Patient Active Problem List   Diagnosis Date Noted  . Pressure injury of skin 06/23/2018  . Acute CVA (cerebrovascular accident) ([place]) 06/22/2018  . Lower GI bleed 06/21/2018  . Constipation 06/21/2018  . Acute encephalopathy   . Diarrhea 06/19/2018  . GERD (gastroesophageal reflux disease) 06/19/2018  . Microcytic anemia 06/19/2018  . Acute kidney injury superimposed on chronic kidney disease ([place]) 06/19/2018  . Hypermagnesemia 06/19/2018  . Elevated AST (SGOT) 06/19/2018  . Confusion 06/19/2018  . Goals of care, counseling/discussion 06/09/2018  . Encounter for antineoplastic chemotherapy 06/09/2018  . Encounter for antineoplastic immunotherapy 06/09/2018  . Malignant neoplasm of upper lobe of left lung ([place]) 06/08/2018  . Abscess, peritonsillar 02/12/2012  . Acute respiratory failure with hypoxia ([place]) 02/12/2012  . Hyperglycemia 02/12/2012  . ERECTILE DYSFUNCTION 12/10/2010  . HEADACHE 08/11/2010  . COLONIC POLYPS, ADENOMATOUS 05/18/2010  . ANEMIA, SECONDARY TO ACUTE BLOOD LOSS 05/18/2010  . COLONIC POLYPS, ADENOMATOUS, HX OF 05/18/2010  . ERECTILE DYSFUNCTION, ORGANIC 05/06/2010  . HLD (hyperlipidemia) 02/18/2010  . ANXIETY 02/18/2010  . GLAUCOMA 02/18/2010  . Essential hypertension 02/18/2010    Orientation  RESPIRATION BLADDER Height & Weight     Self, Situation, Place  Normal Continent Weight: 138 lb 7.2 oz (62.8 kg) Height:  5' 8"  (172.7 cm)  BEHAVIORAL SYMPTOMS/MOOD NEUROLOGICAL BOWEL NUTRITION STATUS      Continent Diet(See discharge summary )  AMBULATORY STATUS COMMUNICATION OF NEEDS Skin   Extensive Assist Verbally PU Stage and Appropriate Care   PU Stage 2 Dressing: (Sacrum-Loss of dermis, small nickel quarter size/ Chest Incision)                   Personal Care Assistance Level of Assistance  Bathing, Feeding, Dressing Bathing Assistance: Limited assistance Feeding assistance: Independent Dressing Assistance: Limited assistance     Functional Limitations Info  Sight, Hearing, Speech Sight Info: Impaired Hearing Info: Adequate Speech Info: Adequate    SPECIAL CARE FACTORS FREQUENCY  PT (By licensed PT), OT (By licensed OT)     PT Frequency: 5x/week OT Frequency: 5x/week            Contractures Contractures Info: Not present    Additional Factors Info  Code Status, Allergies Code Status Info: Fullcode Allergies Info: Rosuvastatin           Current Medications (06/23/2018):  This is the current hospital active medication list Current Facility-Administered Medications  Medication Dose Route Frequency Provider Last Rate Last Dose  . 0.9 %  sodium chloride infusion   Intravenous Continuous Eugenie Filler, MD 100 mL/hr at 06/23/18 0600    . acetaminophen (TYLENOL) tablet 650 mg  650 mg Oral Q4H PRN Eugenie Filler, MD       Or  . acetaminophen (TYLENOL) solution 650 mg  650 mg Per Tube Q4H  PRN Eugenie Filler, MD       Or  . acetaminophen (TYLENOL) suppository 650 mg  650 mg Rectal Q4H PRN Eugenie Filler, MD      . acetaminophen (TYLENOL) tablet 650 mg  650 mg Oral Q6H PRN Eugenie Filler, MD       Or  . acetaminophen (TYLENOL) suppository 650 mg  650 mg Rectal Q6H PRN Eugenie Filler, MD      . alum & mag hydroxide-simeth  (MAALOX/MYLANTA) 200-200-20 MG/5ML suspension 30 mL  30 mL Oral Q4H PRN Eugenie Filler, MD   30 mL at 06/19/18 2111  . bisacodyl (DULCOLAX) suppository 10 mg  10 mg Rectal Once Eugenie Filler, MD   Stopped at 06/21/18 2003  . bisacodyl (DULCOLAX) suppository 10 mg  10 mg Rectal Daily PRN Eugenie Filler, MD      . levETIRAcetam (KEPPRA) tablet 500 mg  500 mg Oral BID Eugenie Filler, MD   500 mg at 06/23/18 0933  . meclizine (ANTIVERT) tablet 50 mg  50 mg Oral TID PRN Eugenie Filler, MD      . methylPREDNISolone sodium succinate (SOLU-MEDROL) 40 mg/mL injection 20 mg  20 mg Intravenous Q12H Eugenie Filler, MD   20 mg at 06/23/18 0933  . ondansetron (ZOFRAN) tablet 4 mg  4 mg Oral Q6H PRN Eugenie Filler, MD       Or  . ondansetron [facility]) injection 4 mg  4 mg Intravenous Q6H PRN Eugenie Filler, MD   4 mg at 06/20/18 0454  . oxyCODONE-acetaminophen (PERCOCET/ROXICET) 5-325 MG per tablet 1 tablet  1 tablet Oral Q6H PRN Eugenie Filler, MD   1 tablet at 06/22/18 1252  . pantoprazole (PROTONIX) EC tablet 40 mg  40 mg Oral Daily Eugenie Filler, MD   40 mg at 06/23/18 0933  . peg 3350 powder (MOVIPREP) kit 100 g  0.5 kit Oral Once Eugenie Filler, MD      . senna-docusate (Senokot-S) tablet 1 tablet  1 tablet Oral QHS PRN Eugenie Filler, MD      . simvastatin (ZOCOR) tablet 40 mg  40 mg Oral QPM Eugenie Filler, MD   40 mg at 06/22/18 1856  . traMADol (ULTRAM) tablet 25-50 mg  25-50 mg Oral Q12H PRN Eugenie Filler, MD         Discharge Medications: Please see discharge summary for a list of discharge medications.  Relevant Imaging Results:  Relevant Lab Results:   Additional Information [id number]  Lia Hopping, LCSW

## 2018-06-23 NOTE — Progress Notes (Signed)
Occupational Therapy Treatment Patient Details Name: Zachary Hobbs MRN: [id number] DOB: Aug 08, 1941 Today's Date: 06/23/2018    History of present illness 77 yo male with history of small cell lung cancer admitted 06/19/18 with bloody diarrhea,  AKI,receiving chemotherapy. Recent brain MRI demonstrates metastatic leasions. Patient having  multiple falls and unsteady gait PTA. New MRI: Acute subcentimeter RIGHT parietal and LEFT cerebellar infarcts   OT comments  This 77 yo male initially admitted with bloody diarrhea with lung CA mets and had CVAs while here, presents to acute OT making progress with mobility and ADLs but is not further hindered by inattention to RUE and possible increased vision deficits on right. He will continue to benefit from acute OT with follow up at SNF.   Follow Up Recommendations  SNF    Equipment Recommendations  3 in 1 bedside commode       Precautions / Restrictions Precautions Precautions: Fall Restrictions Weight Bearing Restrictions: No       Mobility Bed Mobility               General bed mobility comments: pt up in recliner upon arrival  Transfers Overall transfer level: Needs assistance Equipment used: None Transfers: Sit to/from Stand Sit to Stand: Min assist         General transfer comment: Had pt stand to adjust pillows and chair covering that was under him due to pt was sliding out of recliner    Balance Overall balance assessment: History of Falls;Needs assistance Sitting-balance support: No upper extremity supported;Feet supported Sitting balance-Leahy Scale: Fair     Standing balance support: Bilateral upper extremity supported;During functional activity Standing balance-Leahy Scale: Poor                             ADL either performed or assessed with clinical judgement   ADL Overall ADL's : Needs assistance/impaired     Grooming: Wash/dry face;Set up;Supervision/safety Grooming Details (indicate  cue type and reason): only used LUE to wash face, until cued to use RUE as well (pt in RUE dominant)               Lower Body Dressing Details (indicate cue type and reason): pt doffed socks with increased time and struggle only using one UE to doff, did attempt to don socks with both hands (could not reach with arms to left foot by bringing foot up to him and could not problem solve on his own to do it any other way). With right foot he initially had sock turned upside down but problem solved to turn it around but then dropped it; once he picked it up (had difficulity due to getting right hand stuck in bottom of gown) he could not problem solve to get it turned back around again and gave up. Also demonstrated decreased proprioception with gripping sock with right hand               General ADL Comments: putting on glasses pt only attempted to put them on with LUE until cued to also use RUE     Vision Baseline Vision/History: Wears glasses;Glaucoma(right eye worse than left) Wears Glasses: At all times Patient Visual Report: No change from baseline Vision Assessment?: Yes Eye Alignment: Within Functional Limits Ocular Range of Motion: Within Functional Limits Alignment/Gaze Preference: Within Defined Limits Tracking/Visual Pursuits: Able to track stimulus in all quads without difficulty Additional Comments: pt could reach for target in all directions,  but had trouble meeting target directly (not sure if vision issue or the fact that he wanted to do it do fast was why he was missing)          Cognition Arousal/Alertness: Awake/alert(but keeping eyes closed) Behavior During Therapy: Restless;Impulsive Overall Cognitive Status: Impaired/Different from baseline Area of Impairment: Orientation;Following commands;Safety/judgement;Problem solving                 Orientation Level: (initally stated he was 15, but could tell me the year he was born, at end of session said he was  73 (which is correct))     Following Commands: Follows one step commands inconsistently Safety/Judgement: Decreased awareness of safety;Decreased awareness of deficits   Problem Solving: Difficulty sequencing;Requires verbal cues;Requires tactile cues                     Pertinent Vitals/ Pain       Pain Assessment: No/denies pain         Frequency  Min 2X/week        Progress Toward Goals  OT Goals(current goals can now be found in the care plan section)  Progress towards OT goals: Progressing toward goals     Plan Discharge plan remains appropriate       AM-PAC PT "6 Clicks" Daily Activity     Outcome Measure   Help from another person eating meals?: A Little Help from another person taking care of personal grooming?: A Little Help from another person toileting, which includes using toliet, bedpan, or urinal?: A Lot(can use urinal post set up/ but needs more A to go into bathroom itself) Help from another person bathing (including washing, rinsing, drying)?: A Little Help from another person to put on and taking off regular upper body clothing?: A Little Help from another person to put on and taking off regular lower body clothing?: A Lot 6 Click Score: 16    End of Session    OT Visit Diagnosis: Unsteadiness on feet (R26.81);Muscle weakness (generalized) (M62.81);Other symptoms and signs involving cognitive function;Cognitive communication deficit (R41.841) Symptoms and signs involving cognitive functions: Cerebral infarction   Activity Tolerance Patient tolerated treatment well   Patient Left in chair;with call bell/phone within reach;with chair alarm set;with family/visitor present   Nurse Communication          Time: 1115-1150 OT Time Calculation (min): 35 min  Charges: OT General Charges $OT Visit: 1 Visit OT Treatments $Self Care/Home Management : 23-37 mins  [place], OTR/L [phone] 06/23/2018

## 2018-06-23 NOTE — Clinical Social Work Note (Addendum)
Clinical Social Work Assessment  Patient Details  Name: Zachary Hobbs MRN: [id number] Date of Birth: 02/16/1941  Date of referral:  06/23/18               Reason for consult:  Facility Placement, Discharge Planning                Permission sought to share information with:  Facility Sport and exercise psychologist Permission granted to share information::  Yes, Verbal Permission Granted  Name::       Wubben,cheri  Agency::  SNF  Relationship::  Spouse  Contact Information:    [phone]  Housing/Transportation Living arrangements for the past 2 months:  Single Family Home Source of Information:  Patient, Spouse Patient Interpreter Needed:  None Criminal Activity/Legal Involvement Pertinent to Current Situation/Hospitalization:  No - Comment as needed Significant Relationships:  Spouse Lives with:  Self Do you feel safe going back to the place where you live?  Yes Need for family participation in patient care:  Yes (Comment)  Care giving concerns:   Patient admitted for Diarrhea.  SNF placement for rehab.   Social Worker assessment / plan: CSW met with the patient and his spouse at bedside. Patient express the desire to go home but spouse states it will be an unsafe for him because works during the day. Patient put his head down and deferred to his spouse to assist with SNF planning. Spouse reports the patient is usually independent with his ADL's. She reports the patient is deconditioned and weak and will need rehab to regain his strength before going home.  Patient spouse prefers a facility in the [place] area so that she can visit the patient daily.   CSW explain the SNF process, and later following up with bed offers. Patient and spouse both report understanding.   FL2 and PASRR to be completed.   Plan: SNF Barriers-Patient will need insurance authorization prior to discharge.   Employment status:  Retired Forensic scientist:    PT Recommendations:  [place] / Referral to community resources:  [place]  Patient/Family's Response to care:  Agreeable and Responding well to care.   Patient/Family's Understanding of and Emotional Response to Diagnosis, Current Treatment, and Prognosis: Patient alert and oriented. Patient defers to his wife for understanding and support.   Emotional Assessment Appearance:  Developmentally appropriate Attitude/Demeanor/Rapport:    Affect (typically observed):  Accepting Orientation:  Oriented to Self, Oriented to Place, Oriented to Situation, Oriented to  Time Alcohol / Substance use:  Not Applicable Psych involvement (Current and /or in the community):  No (Comment)  Discharge Needs  Concerns to be addressed:  Discharge Planning Concerns Readmission within the last 30 days:  No Current discharge risk:  Dependent with Mobility Barriers to Discharge:  Continued Medical Work up, [place], LCSW 06/23/2018, 11:40 AM

## 2018-06-23 NOTE — Progress Notes (Signed)
PROGRESS NOTE    Zachary Hobbs  UEK:[id number] DOB: 28-Mar-1941 DOA: 06/19/2018 PCP: Janie Morning, DO    Brief Narrative:  Pt. with PMH of metastatic small cell lung cancer, hypertension, CKD stage III, GERD, hyperlipidemia, anemia; admitted on 06/19/2018, presented with complaint of diarrhea, was found to have aki . Currently further plan is continue IV hydration .   Assessment & Plan:   Active Problems:   HLD (hyperlipidemia)   Essential hypertension   Malignant neoplasm of upper lobe of left lung (HCC)   Diarrhea   GERD (gastroesophageal reflux disease)   Microcytic anemia   Acute kidney injury superimposed on chronic kidney disease (HCC)   Hypermagnesemia   Elevated AST (SGOT)   Confusion   Lower GI bleed   Constipation   Acute encephalopathy   Acute CVA (cerebrovascular accident) ([place])   Pressure injury of skin  #1??  Diarrhea with abdominal tenderness likely secondary to constipation versus immune mediated/chemotherapy mediated Patient noted on CT abdomen and pelvis with formed stool and contrast medium in the distal colon with no dilated bowel and no significant bowel wall thickening to suggest colitis.  Patient with bloody bowel movements on 06/20/2018 and 06/21/2018.  Per wife no further bloody bowel movements and consistency of stool is improving.  C. difficile PCR was canceled.  GI pathogen panel ordered and negative.  Due to concern for inflammatory diarrhea patient has been started on IV methylprednisone instead of oral prednisone per gastroenterology.  GI following and I appreciate the input and recommendations..   2.  Bloody bowel movement/probable lower GI bleed Patient with bloody bowel movements per RN and per wife on 06/21/2018.Marland Kitchen  Hemoglobin currently at 7.9 from 7.7 from 8.7 from 9.9 on admission.  No further bloody bowel movements over the past 24 to 48 hours per wife and per RN.  Stools more soft. Patient with a history of metastatic lung cancer with bone mets  and brain mets.  Patient also with a prior history of colonoscopy in 2011 with a 3 mm sessile polyp noted removed piecemeal with pathology consistent with tubular adenoma.  Patient also noted to have 2 other polyps with biopsies positive for tubular adenoma.  Patient placed on a PPI.  GI consulted and patient was to undergo colonoscopy today however patient unable to tolerate prep overnight and as such colonoscopy canceled.  Hemoglobin currently stable.  Patient with no further bleeding.  GI following and appreciate input and recommendation.  3.  Acute CVA Noted on MRI of head done on 06/21/2018.  Patient with lung cancer with mets to the brain and bone.  Patient also with probable hypercoagulable state.  MRI brain with an acute subcentimeter right parietal and left cerebellar infarcts in addition to metastatic disease and moderate parenchymal brain volume loss.  Patient with concerns for bloody bowel movements and as such unable to place on aspirin for stroke prophylaxis.  GI bleed work-up underway and patient was being prepped for colonoscopy today 06/23/2018 however patient unable to tolerate prep and as such colonoscopy canceled.  Patient with no noted further bleeding over the past 24 to 48 hours.  Stroke work-up done with MRA of the head with no large vessel occlusion identified.  Short segment mild proximal right M1 stenosis, additional minor atherosclerotic change for patient age, no other hemodynamically significant or correctable stenosis identified.  2D echo with EF of 55 to 60%, no wall motion abnormalities, grade 1 diastolic dysfunction, mild aortic valvular stenosis/mild aortic valvular regurgitation, mild mitral valvular  regurgitation, moderately dilated left atrium, no cardiac source of emboli identified.  Carotid Dopplers with no significant ICA stenosis.  LDL of 101.  Patient on a statin.  Due to recent bleed with anemia will defer when patient may be started on full dose aspirin to  gastroenterology.  Neurology consulted and assessed the patient and recommending starting patient on aspirin 325 mg daily if approved by GI.  Neurology following and appreciate input and recommendations.   4.  Anemia Anemia panel consistent with anemia of chronic disease.  Hemoglobin was trending down and currently at 7.9 from 7.7 from 9.9 on admission from 10.1 and 06/02/2018.  Transfusion threshold hemoglobin less than 7.  GI following.   5.  Lung cancer with metastases to the brain and bone Patient being followed by Dr. Lorna Few with recent chemotherapy and immunotherapy.  Patient started on empiric steroids for possible immune mediated colitis.  Dr. Lorna Few was notified and per Dr. Posey Pronto will assess the patient.  Patient currently receiving radiation treatment.  Due to patient's confusion CT head was ordered however MRI of the brain has been ordered per radiation oncology and as such CT head was discontinued.  MRI head concerning for metastatic disease and acute CVA.  Patient started on Keppra for seizure prophylaxis.  Patient receiving radiation treatments.  Oncology informed via epic.  6.  Hypertension Stable.  Valsartan/HCTZ on hold secondary to presentation with diarrhea and lower blood pressure.  Permissive hypertension as patient now noted to have an acute CVA.  Monitor for now.  Follow.  7.  Gastroesophageal reflux disease Continue PPI.  8.  Hyperlipidemia LDL of 101.  Continue statin.  9.  Acute kidney injury on chronic kidney disease stage III Likely secondary to a prerenal azotemia in the setting of ARB and diuretic.  Antihypertensive medications on hold.  Renal function seems to be stabilizing and likely close to baseline.    10.  Acute metabolic encephalopathy Likely secondary to brain mets and dehydration and acute CVA noted on MRI brain which was done 06/21/2018.  Patient improving clinically.  Neurology consulted and are following.    11.   Hypermagnesemia Slowly trending down.  Magnesium magnesium level at 2.4.  Follow.    DVT prophylaxis: SCDs. Code Status: Full Family Communication: Updated patient and wife at bedside. Disposition Plan: Likely need skilled nursing facility once medically stable.    Consultants:   Oncology via epic, Dr. Lorna Few  Gastroenterology: Dr. Rush Landmark 06/21/2018  Neurology: Dr. Cheral Marker 06/22/2018  Procedures:   CT abdomen and pelvis 06/19/2018  MRI brain 06/21/2018  Carotid Dopplers 06/22/2018  2D echo 06/22/2018  MRA 06/22/2018  Antimicrobials:   None   Subjective: Patient alert and following commands appropriately.  Denies any chest pain no shortness of breath.  No further watery stools or bloody stools per RN.  Stools more soft/mushy.  Patient noted patient noted not to be able to tolerate prep for colonoscopy.  Objective: Vitals:   06/23/18 0700 06/23/18 0800 06/23/18 0900 06/23/18 0918  BP: (!) 145/68 (!) 145/67 (!) 153/80   Pulse: 90 90 90   Resp: 16 20 18    Temp:  98 F (36.7 C)    TempSrc:  Oral    SpO2: 97% 99% 97%   Weight:      Height:    5' 8"  (1.727 m)    Intake/Output Summary (Last 24 hours) at 06/23/2018 0930 Last data filed at 06/23/2018 0600 Gross per 24 hour  Intake 1286.35 ml  Output 405 ml  Net 881.35 ml   Filed Weights   06/20/18 0604 06/22/18 1348 06/23/18 0500  Weight: 62.1 kg 67.1 kg 62.8 kg    Examination:  General exam: NAD Respiratory system: CTA B anterior lung fields.  No wheezes, no crackles, no rhonchi.  Normal respiratory effort.  Cardiovascular system: RRR no murmurs rubs or gallops.  Grade 3/6 systolic ejection murmur.  No lower extremity edema.  No JVD.   Gastrointestinal system: Abdomen is nontender, nondistended, soft, positive bowel sounds.  No rebound.  No guarding.  Central nervous system: Alert and oriented.  Cranial nerves II through XII grossly intact.  No focal deficits.  Sensation intact.  Gait not tested  secondary to safety.  Extremities: Symmetric 5 x 5 power. Skin: No rashes, lesions or ulcers Psychiatry: Judgement and insight appear normal. Mood & affect appropriate.     Data Reviewed: I have personally reviewed following labs and imaging studies  CBC: Recent Labs  Lab 06/19/18 1014 06/20/18 0614 06/21/18 0634 06/22/18 0610 06/23/18 0334  WBC 8.6 6.0 4.6 2.5* 2.1*  NEUTROABS  --   --  3.9  --  1.6*  HGB 9.9* 8.6* 8.7* 7.7* 7.9*  HCT 31.0* 27.1* 27.6* 23.7* 24.9*  MCV 73.6* 73.4* 74.0* 73.4* 74.1*  PLT 285 246 218 175 962   Basic Metabolic Panel: Recent Labs  Lab 06/19/18 1014 06/20/18 0614 06/21/18 0634 06/22/18 0610 06/23/18 0334  NA 143 142 143 141 142  K 4.5 4.5 4.5 4.7 4.6  CL 110 110 114* 115* 116*  CO2 21* 22 19* 19* 18*  GLUCOSE 119* 132* 104* 121* 89  BUN 95* 79* 60* 53* 40*  CREATININE 2.36* 2.01* 1.80* 1.74* 1.73*  CALCIUM 8.9 7.8* 7.5* 7.1* 6.8*  MG 2.7* 2.5* 2.5* 2.4  --   PHOS  --  3.4  --   --   --    GFR: Estimated Creatinine Clearance: 32.8 mL/min (A) (by C-G formula based on SCr of 1.73 mg/dL (H)). Liver Function Tests: Recent Labs  Lab 06/19/18 1014 06/20/18 0614 06/21/18 0634 06/22/18 0610  AST 30 24 21 20   ALT 69* 51* 42 36  ALKPHOS 184* 143* 132* 120  BILITOT 1.2 0.8 1.1 0.9  PROT 7.2 6.1* 6.0* 5.6*  ALBUMIN 2.4* 2.0* 1.9* 1.8*   Recent Labs  Lab 06/19/18 1014  LIPASE 20   No results for input(s): AMMONIA in the last 168 hours. Coagulation Profile: No results for input(s): INR, PROTIME in the last 168 hours. Cardiac Enzymes: No results for input(s): CKTOTAL, CKMB, CKMBINDEX, TROPONINI in the last 168 hours. BNP (last 3 results) No results for input(s): PROBNP in the last 8760 hours. HbA1C: Recent Labs    06/23/18 0334  HGBA1C 6.3*   CBG: Recent Labs  Lab 06/20/18 0500 06/21/18 0824 06/22/18 0825 06/23/18 0756  GLUCAP 122* 88 100* 76   Lipid Profile: Recent Labs    06/23/18 0334  CHOL 171  HDL 32*   LDLCALC 101*  TRIG 192*  CHOLHDL 5.3   Thyroid Function Tests: No results for input(s): TSH, T4TOTAL, FREET4, T3FREE, THYROIDAB in the last 72 hours. Anemia Panel: Recent Labs    06/21/18 0634  VITAMINB12 1,515*  FOLATE 4.9*  FERRITIN 2,231*  TIBC 148*  IRON 91  RETICCTPCT <0.4*   Sepsis Labs: No results for input(s): PROCALCITON, LATICACIDVEN in the last 168 hours.  Recent Results (from the past 240 hour(s))  Culture, Urine     Status: None   Collection  Time: 06/19/18  2:16 PM  Result Value Ref Range Status   Specimen Description   Final    URINE, CLEAN CATCH Performed at [facility] [address] [place], [place] [address]    Special Requests   Final    NONE Performed at [facility], [place] [address] [place], [place] [address]    Culture   Final    NO GROWTH Performed at [facility], [place] [address], [place], [place] [address]    Report Status 06/20/2018 FINAL  Final  MRSA PCR Screening     Status: None   Collection Time: 06/22/18  2:19 PM  Result Value Ref Range Status   MRSA by PCR NEGATIVE NEGATIVE Final    Comment:        The GeneXpert MRSA Assay (FDA approved for NASAL specimens only), is one component of a comprehensive MRSA colonization surveillance program. It is not intended to diagnose MRSA infection nor to guide or monitor treatment for MRSA infections. Performed at [facility], [place] [address], [place], [place] [address]          Radiology Studies: Dg Chest 2 View  Result Date: 06/22/2018 CLINICAL DATA:  Stroke.  Hypertension.  Lung cancer. EXAM: CHEST - 2 VIEW COMPARISON:  06/02/2018 FINDINGS: Mild cardiomegaly. Small effusions in the posterior costophrenic angles. Progression of intrathoracic metastatic disease. Largest mass in the left lateral mid chest now measures 6.2 cm as opposed to 5.7 cm previously. Innumerable metastatic nodules throughout both lungs as seen  previously. IMPRESSION: Small effusions in the posterior costophrenic angles. Enlargement of the dominant mass density in the left lateral chest. Innumerable widespread bilateral pulmonary metastatic nodules. Electronically Signed   By: Nelson Chimes M.D.   On: 06/22/2018 10:35   Mr Jeri Cos QI Contrast  Result Date: 06/21/2018 CLINICAL DATA:  Follow-up metastatic lung cancer. EXAM: MRI HEAD WITHOUT AND WITH CONTRAST TECHNIQUE: Multiplanar, multiecho pulse sequences of the brain and surrounding structures were obtained without and with intravenous contrast. CONTRAST:  47m MULTIHANCE GADOBENATE DIMEGLUMINE 529 MG/ML IV SOLN COMPARISON:  Noncontrast MRI head June 07, 2018 FINDINGS: Sequences vary from moderate to severely motion degraded. Motion degraded examination. Patient received Percocet. BRAIN Lesions: At least 4: 14 x 15 mm enhancing lesion located RIGHT temporal lobe with mild surrounding vasogenic edema and and no significant mass effect seen on series 12, image 62. 8 x 9 mm enhancing lesion located LEFT frontal periventricular white matter without significant vasogenic edema or mass effect seen on series 12, image 98. 6 x 6 mm enhancing lesion located LEFT frontal convexity cortex with proportional vasogenic edema, no significant mass effect seen on series 12, image 126. 7 x 10 mm enhancing lesion located LEFT parietal convexity cortex with proportional vasogenic edema, no significant mass effect seen on series 12, image 130. 4 mm enhancing lesion located RIGHT mesial temporal lobe without vasogenic edema or mass effect may be artifact only seen on series 17, image 33. Other Brain findings: INTRACRANIAL CONTENTS: RIGHT cerebellar developmental venous anomaly. Subcentimeter reduced diffusion LEFT cerebellum with low ADC values. Subcentimeter reduced diffusion RIGHT parietal cortex with low ADC values. No susceptibility artifact to suggest hemorrhage. Patchy to confluent supratentorial white matter FLAIR  T2 hyperintensities exclusive aforementioned abnormalities. Moderate parenchymal brain volume loss. No hydrocephalus. RIGHT cerebellar developmental venous anomaly. No abnormal extra-axial fluid collections. Enhancement along the anterior falx. VASCULAR: Normal major intracranial vascular flow voids present at skull base. SKULL AND UPPER CERVICAL  SPINE: No abnormal sellar expansion. Abnormally decreased bone marrow signal and faint enhancement C3 and C4 vertebral bodies concerning for osseous metastasis. Craniocervical junction maintained. SINUSES/ORBITS: The mastoid air-cells and included paranasal sinuses are well-aerated.The included ocular globes and orbital contents are non-suspicious. OTHER: None. IMPRESSION: 1. Moderate to severely motion degraded examination: 4-5 supratentorial intraparenchymal metastasis largest in RIGHT temporal lobe measuring 14 x 15 mm. C3 and C4 suspected osseous metastasis. No pathologic fracture. 2. Acute subcentimeter RIGHT parietal and LEFT cerebellar infarcts. 3. Moderate parenchymal brain volume loss and moderate chronic small vessel ischemic changes. 4. These results will be called to the ordering clinician or representative by the Radiologist Assistant, and communication documented in the PACS or zVision Dashboard. Electronically Signed   By: Elon Alas M.D.   On: 06/21/2018 23:05   Mr Jodene Nam Head Wo Contrast  Result Date: 06/22/2018 CLINICAL DATA:  Initial evaluation for acute confusion. Recently identified infarcts with known metastatic disease. EXAM: MRA HEAD WITHOUT CONTRAST TECHNIQUE: Angiographic images of the Circle of Willis were obtained using MRA technique without intravenous contrast. COMPARISON:  Previous brain MRI from 06/21/2018. FINDINGS: ANTERIOR CIRCULATION: Distal cervical segments of the internal carotid arteries are patent with antegrade flow. Petrous, cavernous, and supraclinoid segments widely patent without stenosis. A1 segments widely patent.  Normal anterior communicating artery. Anterior cerebral arteries widely patent to their distal aspects without stenosis. Short-segment mild stenosis at the proximal right M1 segment (series 303, image 9). M1 segments otherwise widely patent bilaterally. No proximal M2 occlusion. Distal MCA branches well perfused and fairly symmetric. POSTERIOR CIRCULATION: Vertebral arteries widely patent to the vertebrobasilar junction. Partially visualized posterior inferior cerebral arteries patent bilaterally. Basilar widely patent to its distal aspect. Superior cerebral arteries patent bilaterally. Both of the posterior cerebral artery supplied via the basilar. Mild atheromatous irregularity within the PCAs bilaterally without significant stenosis. PCAs are widely patent to their distal aspects. No intracranial aneurysm or other vascular abnormality. IMPRESSION: 1. Negative intracranial MRA with no large vessel occlusion identified. 2. Short-segment mild proximal right M1 stenosis. 3. Additional minor atherosclerotic change for patient age. No other hemodynamically significant or correctable stenosis identified. Electronically Signed   By: Jeannine Boga M.D.   On: 06/22/2018 19:47        Scheduled Meds: . bisacodyl  10 mg Rectal Once  . levETIRAcetam  500 mg Oral BID  . methylPREDNISolone (SOLU-MEDROL) injection  20 mg Intravenous Q12H  . pantoprazole  40 mg Oral Daily  . peg 3350 powder  0.5 kit Oral Once  . simvastatin  40 mg Oral QPM   Continuous Infusions: . sodium chloride 100 mL/hr at 06/23/18 0600     LOS: 4 days    Time spent: 40 minutes    Irine Seal, MD [facility] Pager [phone]  If 7PM-7AM, please contact night-coverage [email or web address] Password [email or web address] 06/23/2018, 9:30 AM

## 2018-06-23 NOTE — Telephone Encounter (Signed)
RN called last night around 1AM.  He drank 1/2 of his prep and vomited most of it up.  He does not want to drink any more prep.  He understands that the colonoscopy probably cannot be done unless he preps, still declined to try again.

## 2018-06-23 NOTE — Progress Notes (Signed)
Physical Therapy Treatment Patient Details Name: Zachary Hobbs MRN: [id number] DOB: 08/02/1941 Today's Date: 06/23/2018    History of Present Illness 77 yo male with history of small cell lung cancer admitted 06/19/18 with bloody diarrhea,  AKI,receiving chemotherapy. Recent brain MRI demonstrates metastatic leasions. Patient having  multiple falls and unsteady gait PTA. New MRI: Acute subcentimeter RIGHT parietal and LEFT cerebellar infarcts    PT Comments    Pt required MAX encouragement to get OOB.  Spouse in room also helped motivate.  Assisted with amb a great distance however required need for walker due to instability and mod c/o B LE weakness.  Recliner chair following for safety.    Follow Up Recommendations  SNF     Equipment Recommendations  None recommended by PT    Recommendations for Other Services       Precautions / Restrictions Precautions Precautions: Fall Restrictions Weight Bearing Restrictions: No    Mobility  Bed Mobility Overal bed mobility: Needs Assistance Bed Mobility: Supine to Sit     Supine to sit: Min assist;+2 for safety/equipment     General bed mobility comments: caution to multiple lines and monitoring vitals   Transfers Overall transfer level: Needs assistance Equipment used: Rolling walker (2 wheeled) Transfers: Sit to/from Stand Sit to Stand: Min guard;Min assist         General transfer comment: 25% VC's on safety with hand placement  Ambulation/Gait Ambulation/Gait assistance: Supervision;Min guard Gait Distance (Feet): 175 Feet Assistive device: Rolling walker (2 wheeled) Gait Pattern/deviations: Step-to pattern;Staggering right;Staggering left     General Gait Details: mild unsteady gait with need for walker with c/o weakness/fatigue and low energy.     Stairs             Wheelchair Mobility    Modified Rankin (Stroke Patients Only)       Balance Overall balance assessment: History of Falls;Needs  assistance Sitting-balance support: No upper extremity supported;Feet supported Sitting balance-Leahy Scale: Fair     Standing balance support: Bilateral upper extremity supported;During functional activity Standing balance-Leahy Scale: Poor                              Cognition Arousal/Alertness: Awake/alert Behavior During Therapy: Restless;Impulsive Overall Cognitive Status: Within Functional Limits for tasks assessed Area of Impairment: Orientation;Following commands;Safety/judgement;Problem solving                 Orientation Level: (initally stated he was 55, but could tell me the year he was born, at end of session said he was 62 (which is correct))     Following Commands: Follows one step commands inconsistently Safety/Judgement: Decreased awareness of safety;Decreased awareness of deficits   Problem Solving: Difficulty sequencing;Requires verbal cues;Requires tactile cues General Comments: required MAX encouragement to Participate       Exercises      General Comments        Pertinent Vitals/Pain Pain Assessment: No/denies pain    Home Living                      Prior Function            PT Goals (current goals can now be found in the care plan section) Progress towards PT goals: Progressing toward goals    Frequency    Min 2X/week      PT Plan Current plan remains appropriate    Co-evaluation  AM-PAC PT "6 Clicks" Daily Activity  Outcome Measure  Difficulty turning over in bed (including adjusting bedclothes, sheets and blankets)?: A Lot Difficulty moving from lying on back to sitting on the side of the bed? : A Lot Difficulty sitting down on and standing up from a chair with arms (e.g., wheelchair, bedside commode, etc,.)?: A Lot Help needed moving to and from a bed to chair (including a wheelchair)?: A Lot Help needed walking in hospital room?: A Lot Help needed climbing 3-5 steps with a  railing? : A Lot 6 Click Score: 12    End of Session Equipment Utilized During Treatment: Gait belt Activity Tolerance: Patient tolerated treatment well Patient left: in chair;with call bell/phone within reach;with chair alarm set;with family/visitor present Nurse Communication: Mobility status PT Visit Diagnosis: Unsteadiness on feet (R26.81);Pain;Repeated falls (R29.6)     Time: 1749-4496 PT Time Calculation (min) (ACUTE ONLY): 27 min  Charges:  $Gait Training: 8-22 mins $Therapeutic Activity: 8-22 mins                     Rica Koyanagi  PTA [facility]  Acute  Rehab Pager      [phone]

## 2018-06-24 LAB — GLUCOSE, CAPILLARY: GLUCOSE-CAPILLARY: 83 mg/dL (ref 70–99)

## 2018-06-24 LAB — BASIC METABOLIC PANEL
ANION GAP: 7 (ref 5–15)
BUN: 37 mg/dL — ABNORMAL HIGH (ref 8–23)
CO2: 17 mmol/L — ABNORMAL LOW (ref 22–32)
Calcium: 6.5 mg/dL — ABNORMAL LOW (ref 8.9–10.3)
Chloride: 117 mmol/L — ABNORMAL HIGH (ref 98–111)
Creatinine, Ser: 1.89 mg/dL — ABNORMAL HIGH (ref 0.61–1.24)
GFR calc Af Amer: 38 mL/min — ABNORMAL LOW (ref >60)
GFR, EST NON AFRICAN AMERICAN: 33 mL/min — AB (ref >60)
GLUCOSE: 92 mg/dL (ref 70–99)
POTASSIUM: 4.1 mmol/L (ref 3.5–5.1)
SODIUM: 141 mmol/L (ref 135–145)

## 2018-06-24 LAB — CBC
HCT: 25.1 % — ABNORMAL LOW (ref 39.0–52.0)
Hemoglobin: 7.9 g/dL — ABNORMAL LOW (ref 13.0–17.0)
MCH: 23.3 pg — ABNORMAL LOW (ref 26.0–34.0)
MCHC: 31.5 g/dL (ref 30.0–36.0)
MCV: 74 fL — AB (ref 78.0–100.0)
PLATELETS: 149 10*3/uL — AB (ref 150–400)
RBC: 3.39 MIL/uL — AB (ref 4.22–5.81)
RDW: 16.9 % — ABNORMAL HIGH (ref 11.5–15.5)
WBC: 2.7 10*3/uL — AB (ref 4.0–10.5)

## 2018-06-24 MED ORDER — PREDNISONE 20 MG PO TABS
40.0000 mg | ORAL_TABLET | Freq: Every day | ORAL | Status: DC
  Filled 2018-06-24: qty 2

## 2018-06-24 MED ORDER — SODIUM BICARBONATE 650 MG PO TABS
650.0000 mg | ORAL_TABLET | Freq: Two times a day (BID) | ORAL | Status: DC
  Administered 2018-06-24 (×2): 650 mg via ORAL
  Filled 2018-06-24 (×2): qty 1

## 2018-06-24 MED ORDER — ASPIRIN 325 MG PO TABS
325.0000 mg | ORAL_TABLET | Freq: Every day | ORAL | Status: DC
  Administered 2018-06-24 – 2018-06-25 (×2): 325 mg via ORAL
  Filled 2018-06-24 (×2): qty 1

## 2018-06-24 NOTE — Progress Notes (Signed)
MRA head reveals the following: 1. No large vessel occlusion identified. 2. Short-segment mild proximal right M1 stenosis. 3. Additional minor atherosclerotic change for patient age.   Carotid ultrasound reveals 1-39% stenosis of ICAs bilaterally. Bilateral vertebral arteries demonstrate antegrade flow.  No cardiac source of emboli identified on TTE.   Continue ASA.   Neurology will sign off. Please call if there are additional questions.   Electronically signed: Dr. Kerney Elbe

## 2018-06-24 NOTE — Progress Notes (Signed)
PROGRESS NOTE    Zachary Hobbs  IWO:[id number] DOB: [birth year] DOA: 06/19/2018 PCP: Janie Morning, DO    Brief Narrative:  Pt. with PMH of metastatic small cell lung cancer, hypertension, CKD stage III, GERD, hyperlipidemia, anemia; admitted on 06/19/2018, presented with complaint of diarrhea, was found to have aki . Currently further plan is continue IV hydration .   Assessment & Plan:   Active Problems:   HLD (hyperlipidemia)   Essential hypertension   Malignant neoplasm of upper lobe of left lung (HCC)   Diarrhea   GERD (gastroesophageal reflux disease)   Microcytic anemia   Acute kidney injury superimposed on chronic kidney disease (HCC)   Hypermagnesemia   Elevated AST (SGOT)   Confusion   Lower GI bleed   Constipation   Acute encephalopathy   Acute CVA (cerebrovascular accident) ([place])   Pressure injury of skin   Severe dehydration  #1??  Diarrhea with abdominal tenderness likely secondary to constipation versus immune mediated/chemotherapy mediated Patient noted on CT abdomen and pelvis with formed stool and contrast medium in the distal colon with no dilated bowel and no significant bowel wall thickening to suggest colitis.  Patient with bloody bowel movements on 06/20/2018 and 06/21/2018.  Per wife no further bloody bowel movements and consistency of stool is improving.  C. difficile PCR was canceled.  GI pathogen panel ordered and negative.  Due to concern for inflammatory diarrhea patient has been started on IV methylprednisone instead of oral prednisone per gastroenterology.  Will transition to oral prednisone 40 mg daily as recommended per GI and monitor closely.  Per GI if patient is transition to prednisone and does not improve in terms of his diarrhea symptoms over the course of the weekend and GI may consider flexible sigmoidoscopy.  GI following and I appreciate the input and recommendations..   2.  Bloody bowel movement/probable lower GI bleed Patient with bloody  bowel movements per RN and per wife on 06/21/2018.Marland Kitchen  Hemoglobin currently at 7.9 from 7.9 from 7.7 from 8.7 from 9.9 on admission.  No further bloody bowel movements over the past 48 to 72 hours per wife.  Stools more soft. Patient with a history of metastatic lung cancer with bone mets and brain mets.  Patient also with a prior history of colonoscopy in 2011 with a 3 mm sessile polyp noted removed piecemeal with pathology consistent with tubular adenoma.  Patient also noted to have 2 other polyps with biopsies positive for tubular adenoma.  Patient placed on a PPI.  GI consulted and patient was to undergo colonoscopy today however patient unable to tolerate prep and as such colonoscopy canceled.  Hemoglobin currently stable.  Patient with no further bleeding.  GI following and appreciate input and recommendation.  3.  Acute CVA Noted on MRI of head done on 06/21/2018.  Patient with lung cancer with mets to the brain and bone.  Patient also with probable hypercoagulable state.  MRI brain with an acute subcentimeter right parietal and left cerebellar infarcts in addition to metastatic disease and moderate parenchymal brain volume loss.  Patient with concerns for bloody bowel movements and as such unable to place on aspirin for stroke prophylaxis.  GI bleed work-up underway and patient was being prepped for colonoscopy today 06/23/2018 however patient unable to tolerate prep and as such colonoscopy canceled.  Patient with no noted further bleeding over the past 24 to 48 hours.  Stroke work-up done with MRA of the head with no large vessel occlusion identified.  Short segment mild proximal right M1 stenosis, additional minor atherosclerotic change for patient age, no other hemodynamically significant or correctable stenosis identified.  2D echo with EF of 55 to 60%, no wall motion abnormalities, grade 1 diastolic dysfunction, mild aortic valvular stenosis/mild aortic valvular regurgitation, mild mitral valvular  regurgitation, moderately dilated left atrium, no cardiac source of emboli identified.  Carotid Dopplers with no significant ICA stenosis.  LDL of 101.  Patient on a statin.  Due to recent bleed with anemia full dose aspirin therapy was deferred.  Patient with no further bleeding.  Colonoscopy has been canceled as patient could not tolerate prep and as patient with no further bleeding with a stable hemoglobin no plans for colonoscopy at this time per gastroenterology.  Will start full dose aspirin 325 mg daily as recommended per neurology for secondary stroke prophylaxis.  Neurology following and appreciate input and recommendations.   4.  Anemia Anemia panel consistent with anemia of chronic disease.  Hemoglobin was trending down and currently seems to have stabilized at 7.9 from 7.9 from 7.7 from 9.9 on admission from 10.1 and 06/02/2018.  Patient denies any further bleeding.  Transfusion threshold hemoglobin less than 7.  Patient being started on aspirin for acute CVA, monitor H&H.  GI following.   5.  Lung cancer with metastases to the brain and bone Patient being followed by Dr. Lorna Few with recent chemotherapy and immunotherapy.  Patient started on empiric steroids for possible immune mediated colitis.  Dr. Lorna Few was notified and per Dr. Posey Pronto will assess the patient.  Patient currently receiving radiation treatment.  Due to patient's confusion CT head was ordered however MRI of the brain has been ordered per radiation oncology and as such CT head was discontinued.  MRI head concerning for metastatic disease and acute CVA.  Patient started on Keppra for seizure prophylaxis.  Patient receiving radiation treatments.  Oncology informed via epic.  6.  Hypertension Stable.  Valsartan/HCTZ on hold secondary to presentation with diarrhea and lower blood pressure.  BP is stable.  Follow.   7.  Gastroesophageal reflux disease Continue PPI.  8.  Hyperlipidemia LDL of 101.  Continue  statin.  9.  Acute kidney injury on chronic kidney disease stage III/acidosis Likely secondary to a prerenal azotemia in the setting of ARB and diuretic.  Antihypertensive medications on hold.  Renal function 1.89 and likely close to baseline.  Decrease IV fluids to 50 cc/h.  Start oral bicarb twice daily.  10.  Acute metabolic encephalopathy Likely secondary to brain mets and dehydration and acute CVA noted on MRI brain which was done 06/21/2018.  Patient improving clinically.  Neurology consulted and are following.    11.  Hypermagnesemia Slowly trending down.  Magnesium magnesium level at 2.4.  Follow.    DVT prophylaxis: SCDs. Code Status: Full Family Communication: Updated patient and wife at bedside. Disposition Plan: Transfer to telemetry.  Likely need skilled nursing facility once medically stable.    Consultants:   Oncology via epic, Dr. Lorna Few  Gastroenterology: Dr. Rush Landmark 06/21/2018  Neurology: Dr. Cheral Marker 06/22/2018  Procedures:   CT abdomen and pelvis 06/19/2018  MRI brain 06/21/2018  Carotid Dopplers 06/22/2018  2D echo 06/22/2018  MRA 06/22/2018  Antimicrobials:   None   Subjective: Patient sleeping however easily arousable.  Poor oral intake.  Patient denies any chest pain no shortness of breath.  Patient denies any further bleeding.  Diarrhea improved.  Patient resistant to the idea of skilled nursing facility.  Objective: Vitals:   06/24/18 0352 06/24/18 0400 06/24/18 0654 06/24/18 0800  BP:  (!) 145/69  139/67  Pulse:  82    Resp:  15  18  Temp: 98.5 F (36.9 C)   98.2 F (36.8 C)  TempSrc: Oral   Oral  SpO2:  96%  98%  Weight:   66.1 kg   Height:        Intake/Output Summary (Last 24 hours) at 06/24/2018 0956 Last data filed at 06/24/2018 0900 Gross per 24 hour  Intake 1443.15 ml  Output 400 ml  Net 1043.15 ml   Filed Weights   06/22/18 1348 06/23/18 0500 06/24/18 0654  Weight: 67.1 kg 62.8 kg 66.1 kg     Examination:  General exam: NAD Respiratory system: Lungs clear to auscultation bilaterally.  No wheezes, no crackles, no rhonchi.  Normal respiratory effort.   Cardiovascular system: Regular rate rhythm no rubs or gallops.  Grade 3/6 systolic ejection murmur.  No lower extremity edema.  No JVD.   Gastrointestinal system: Abdomen is nontender, nondistended, soft, positive bowel sounds.  No rebound.  No guarding.  Central nervous system: Alert and oriented.  Cranial nerves II through XII grossly intact.  No focal deficits.  Sensation intact.  Gait not tested secondary to safety.  Extremities: Symmetric 5 x 5 power. Skin: No rashes, lesions or ulcers Psychiatry: Judgement and insight appear normal. Mood & affect appropriate.     Data Reviewed: I have personally reviewed following labs and imaging studies  CBC: Recent Labs  Lab 06/20/18 0614 06/21/18 0634 06/22/18 0610 06/23/18 0334 06/24/18 0342  WBC 6.0 4.6 2.5* 2.1* 2.7*  NEUTROABS  --  3.9  --  1.6*  --   HGB 8.6* 8.7* 7.7* 7.9* 7.9*  HCT 27.1* 27.6* 23.7* 24.9* 25.1*  MCV 73.4* 74.0* 73.4* 74.1* 74.0*  PLT 246 218 175 160 993*   Basic Metabolic Panel: Recent Labs  Lab 06/19/18 1014 06/20/18 0614 06/21/18 0634 06/22/18 0610 06/23/18 0334 06/24/18 0342  NA 143 142 143 141 142 141  K 4.5 4.5 4.5 4.7 4.6 4.1  CL 110 110 114* 115* 116* 117*  CO2 21* 22 19* 19* 18* 17*  GLUCOSE 119* 132* 104* 121* 89 92  BUN 95* 79* 60* 53* 40* 37*  CREATININE 2.36* 2.01* 1.80* 1.74* 1.73* 1.89*  CALCIUM 8.9 7.8* 7.5* 7.1* 6.8* 6.5*  MG 2.7* 2.5* 2.5* 2.4  --   --   PHOS  --  3.4  --   --   --   --    GFR: Estimated Creatinine Clearance: 31.6 mL/min (A) (by C-G formula based on SCr of 1.89 mg/dL (H)). Liver Function Tests: Recent Labs  Lab 06/19/18 1014 06/20/18 0614 06/21/18 0634 06/22/18 0610  AST _0 ALT 69* 51* 42 36  ALKPHOS 184* 143* 132* 120  BILITOT 1.2 0.8 1.1 0.9  PROT 7.2 6.1* 6.0* 5.6*  ALBUMIN  2.4* 2.0* 1.9* 1.8*   Recent Labs  Lab 06/19/18 1014  LIPASE 20   No results for input(s): AMMONIA in the last 168 hours. Coagulation Profile: No results for input(s): INR, PROTIME in the last 168 hours. Cardiac Enzymes: No results for input(s): CKTOTAL, CKMB, CKMBINDEX, TROPONINI in the last 168 hours. BNP (last 3 results) No results for input(s): PROBNP in the last 8760 hours. HbA1C: Recent Labs    06/23/18 0334  HGBA1C 6.3*   CBG: Recent Labs  Lab 06/20/18 0500 06/21/18 0824 06/22/18 0825 06/23/18 0756 06/24/18  0806  GLUCAP 122* 88 100* 76 83   Lipid Profile: Recent Labs    06/23/18 0334  CHOL 171  HDL 32*  LDLCALC 101*  TRIG 192*  CHOLHDL 5.3   Thyroid Function Tests: No results for input(s): TSH, T4TOTAL, FREET4, T3FREE, THYROIDAB in the last 72 hours. Anemia Panel: No results for input(s): VITAMINB12, FOLATE, FERRITIN, TIBC, IRON, RETICCTPCT in the last 72 hours. Sepsis Labs: No results for input(s): PROCALCITON, LATICACIDVEN in the last 168 hours.  Recent Results (from the past 240 hour(s))  Culture, Urine     Status: None   Collection Time: 06/19/18  2:16 PM  Result Value Ref Range Status   Specimen Description   Final    URINE, CLEAN CATCH Performed at [place] [address], [place], [place] [address]    Special Requests   Final    NONE Performed at [facility], [place] [address], [place], [place] [address]    Culture   Final    NO GROWTH Performed at [facility], [place] [address], [place], [place] [address]    Report Status 06/20/2018 FINAL  Final  MRSA PCR Screening     Status: None   Collection Time: 06/22/18  2:19 PM  Result Value Ref Range Status   MRSA by PCR NEGATIVE NEGATIVE Final    Comment:        The GeneXpert MRSA Assay (FDA approved for NASAL specimens only), is one component of a comprehensive MRSA colonization surveillance program. It is not intended to diagnose  MRSA infection nor to guide or monitor treatment for MRSA infections. Performed at [facility], [place] [address] [place], [place] [address]   Gastrointestinal Panel by PCR , Stool     Status: None   Collection Time: 06/23/18  4:50 AM  Result Value Ref Range Status   Campylobacter species NOT DETECTED NOT DETECTED Final   Plesimonas shigelloides NOT DETECTED NOT DETECTED Final   Salmonella species NOT DETECTED NOT DETECTED Final   Yersinia enterocolitica NOT DETECTED NOT DETECTED Final   Vibrio species NOT DETECTED NOT DETECTED Final   Vibrio cholerae NOT DETECTED NOT DETECTED Final   Enteroaggregative E coli (EAEC) NOT DETECTED NOT DETECTED Final   Enteropathogenic E coli (EPEC) NOT DETECTED NOT DETECTED Final   Enterotoxigenic E coli (ETEC) NOT DETECTED NOT DETECTED Final   Shiga like toxin producing E coli (STEC) NOT DETECTED NOT DETECTED Final   Shigella/Enteroinvasive E coli (EIEC) NOT DETECTED NOT DETECTED Final   Cryptosporidium NOT DETECTED NOT DETECTED Final   Cyclospora cayetanensis NOT DETECTED NOT DETECTED Final   Entamoeba histolytica NOT DETECTED NOT DETECTED Final   Giardia lamblia NOT DETECTED NOT DETECTED Final   Adenovirus F40/41 NOT DETECTED NOT DETECTED Final   Astrovirus NOT DETECTED NOT DETECTED Final   Norovirus GI/GII NOT DETECTED NOT DETECTED Final   Rotavirus A NOT DETECTED NOT DETECTED Final   Sapovirus (I, II, IV, and V) NOT DETECTED NOT DETECTED Final    Comment: Performed at [facility], [address] [place], [place] [address]         Radiology Studies: Dg Chest 2 View  Result Date: 06/22/2018 CLINICAL DATA:  Stroke.  Hypertension.  Lung cancer. EXAM: CHEST - 2 VIEW COMPARISON:  06/02/2018 FINDINGS: Mild cardiomegaly. Small effusions in the posterior costophrenic angles. Progression of intrathoracic metastatic disease. Largest mass in the left lateral mid chest now measures 6.2 cm as opposed to 5.7 cm previously.  Innumerable metastatic nodules throughout both lungs as seen previously. IMPRESSION: Small effusions in the posterior costophrenic angles. Enlargement of the dominant mass density in the left lateral chest. Innumerable widespread bilateral pulmonary metastatic nodules. Electronically Signed   By: Nelson Chimes M.D.   On: 06/22/2018 10:35   Mr Jodene Nam Head Wo Contrast  Result Date: 06/22/2018 CLINICAL DATA:  Initial evaluation for acute confusion. Recently identified infarcts with known metastatic disease. EXAM: MRA HEAD WITHOUT CONTRAST TECHNIQUE: Angiographic images of the Circle of Willis were obtained using MRA technique without intravenous contrast. COMPARISON:  Previous brain MRI from 06/21/2018. FINDINGS: ANTERIOR CIRCULATION: Distal cervical segments of the internal carotid arteries are patent with antegrade flow. Petrous, cavernous, and supraclinoid segments widely patent without stenosis. A1 segments widely patent. Normal anterior communicating artery. Anterior cerebral arteries widely patent to their distal aspects without stenosis. Short-segment mild stenosis at the proximal right M1 segment (series 303, image 9). M1 segments otherwise widely patent bilaterally. No proximal M2 occlusion. Distal MCA branches well perfused and fairly symmetric. POSTERIOR CIRCULATION: Vertebral arteries widely patent to the vertebrobasilar junction. Partially visualized posterior inferior cerebral arteries patent bilaterally. Basilar widely patent to its distal aspect. Superior cerebral arteries patent bilaterally. Both of the posterior cerebral artery supplied via the basilar. Mild atheromatous irregularity within the PCAs bilaterally without significant stenosis. PCAs are widely patent to their distal aspects. No intracranial aneurysm or other vascular abnormality. IMPRESSION: 1. Negative intracranial MRA with no large vessel occlusion identified. 2. Short-segment mild proximal right M1 stenosis. 3. Additional minor  atherosclerotic change for patient age. No other hemodynamically significant or correctable stenosis identified. Electronically Signed   By: Jeannine Boga M.D.   On: 06/22/2018 19:47        Scheduled Meds: . aspirin  325 mg Oral Daily  . bisacodyl  10 mg Rectal Once  . levETIRAcetam  500 mg Oral BID  . methylPREDNISolone (SOLU-MEDROL) injection  20 mg Intravenous Q12H  . pantoprazole  40 mg Oral Daily  . peg 3350 powder  0.5 kit Oral Once  . simvastatin  40 mg Oral QPM  . sodium bicarbonate  650 mg Oral BID   Continuous Infusions: . sodium chloride 75 mL/hr at 06/24/18 0205     LOS: 5 days    Time spent: 40 minutes    Irine Seal, MD [facility] Pager [phone]  If 7PM-7AM, please contact night-coverage [email or web address] Password [facility] 06/24/2018, 9:56 AM

## 2018-06-25 LAB — BASIC METABOLIC PANEL
ANION GAP: 8 (ref 5–15)
BUN: 34 mg/dL — ABNORMAL HIGH (ref 8–23)
CALCIUM: 6.3 mg/dL — AB (ref 8.9–10.3)
CO2: 15 mmol/L — ABNORMAL LOW (ref 22–32)
Chloride: 118 mmol/L — ABNORMAL HIGH (ref 98–111)
Creatinine, Ser: 2 mg/dL — ABNORMAL HIGH (ref 0.61–1.24)
GFR, EST AFRICAN AMERICAN: 36 mL/min — AB (ref >60)
GFR, EST NON AFRICAN AMERICAN: 31 mL/min — AB (ref >60)
Glucose, Bld: 81 mg/dL (ref 70–99)
POTASSIUM: 3.6 mmol/L (ref 3.5–5.1)
SODIUM: 141 mmol/L (ref 135–145)

## 2018-06-25 LAB — CBC WITH DIFFERENTIAL/PLATELET
BASOS ABS: 0 10*3/uL (ref 0.0–0.1)
BASOS PCT: 1 %
EOS PCT: 3 %
Eosinophils Absolute: 0.1 10*3/uL (ref 0.0–0.7)
HCT: 22.8 % — ABNORMAL LOW (ref 39.0–52.0)
Hemoglobin: 7.3 g/dL — ABNORMAL LOW (ref 13.0–17.0)
Lymphocytes Relative: 18 %
Lymphs Abs: 0.4 10*3/uL — ABNORMAL LOW (ref 0.7–4.0)
MCH: 23.6 pg — ABNORMAL LOW (ref 26.0–34.0)
MCHC: 32 g/dL (ref 30.0–36.0)
MCV: 73.8 fL — ABNORMAL LOW (ref 78.0–100.0)
Monocytes Absolute: 0.4 10*3/uL (ref 0.1–1.0)
Monocytes Relative: 20 %
Neutro Abs: 1.2 10*3/uL — ABNORMAL LOW (ref 1.7–7.7)
Neutrophils Relative %: 58 %
PLATELETS: 144 10*3/uL — AB (ref 150–400)
RBC: 3.09 MIL/uL — AB (ref 4.22–5.81)
RDW: 16.9 % — ABNORMAL HIGH (ref 11.5–15.5)
WBC: 2 10*3/uL — AB (ref 4.0–10.5)

## 2018-06-25 LAB — PHOSPHORUS: PHOSPHORUS: 1.5 mg/dL — AB (ref 2.5–4.6)

## 2018-06-25 LAB — GLUCOSE, CAPILLARY: GLUCOSE-CAPILLARY: 75 mg/dL (ref 70–99)

## 2018-06-25 LAB — MAGNESIUM: MAGNESIUM: 2 mg/dL (ref 1.7–2.4)

## 2018-06-25 MED ORDER — SODIUM BICARBONATE 8.4 % IV SOLN
INTRAVENOUS | Status: DC
  Administered 2018-06-25: 10:00:00 via INTRAVENOUS
  Filled 2018-06-25 (×4): qty 1000

## 2018-06-25 MED ORDER — SODIUM BICARBONATE 8.4 % IV SOLN
INTRAVENOUS | Status: DC
  Administered 2018-06-25 – 2018-06-26 (×2): via INTRAVENOUS
  Filled 2018-06-25 (×2): qty 150

## 2018-06-25 MED ORDER — POTASSIUM PHOSPHATES 15 MMOLE/5ML IV SOLN
30.0000 mmol | Freq: Once | INTRAVENOUS | Status: AC
  Administered 2018-06-25: 30 mmol via INTRAVENOUS
  Filled 2018-06-25: qty 10

## 2018-06-25 MED ORDER — ASPIRIN EC 81 MG PO TBEC: 81.0000 mg | DELAYED_RELEASE_TABLET | Freq: Every day | ORAL | Status: DC

## 2018-06-25 MED ORDER — LIP MEDEX EX OINT
TOPICAL_OINTMENT | CUTANEOUS | Status: AC
  Administered 2018-06-25: 10:00:00
  Filled 2018-06-25: qty 7

## 2018-06-25 NOTE — Progress Notes (Signed)
CRITICAL VALUE ALERT  Critical Value: Calcium 6.3  Date & Time Notied:  08/25 @0557   Provider Notified: NP [place]  Orders Received/Actions taken: Awaiting call back

## 2018-06-25 NOTE — Progress Notes (Signed)
PROGRESS NOTE    Zachary Hobbs  NFA:[id number] DOB: [birth year] DOA: 06/19/2018 PCP: Janie Morning, DO    Brief Narrative:  Pt. with PMH of metastatic small cell lung cancer, hypertension, CKD stage III, GERD, hyperlipidemia, anemia; admitted on 06/19/2018, presented with complaint of diarrhea, was found to have aki . Currently further plan is continue IV hydration .   Assessment & Plan:   Active Problems:   HLD (hyperlipidemia)   Essential hypertension   Malignant neoplasm of upper lobe of left lung (HCC)   Diarrhea   GERD (gastroesophageal reflux disease)   Microcytic anemia   Acute kidney injury superimposed on chronic kidney disease (HCC)   Hypermagnesemia   Elevated AST (SGOT)   Confusion   Lower GI bleed   Constipation   Acute encephalopathy   Acute CVA (cerebrovascular accident) ([place])   Pressure injury of skin   Severe dehydration   Hypocalcemia  #1??  Diarrhea with abdominal tenderness likely secondary to constipation versus immune mediated/chemotherapy mediated Patient noted on CT abdomen and pelvis with formed stool and contrast medium in the distal colon with no dilated bowel and no significant bowel wall thickening to suggest colitis.  Patient with bloody bowel movements on 06/20/2018 and 06/21/2018.  Per wife no further bloody bowel movements and consistency of stool is improving per wife.  C. difficile PCR was canceled.  GI pathogen panel ordered and negative.  Due to concern for inflammatory diarrhea patient has been started on IV methylprednisone instead of oral prednisone per gastroenterology initially.  Patient has not been transitioned to oral prednisone 40 mg daily as recommended per GI.  Per GI if patient is transitioned to prednisone and does not improve in terms of his diarrhea symptoms over the course of the weekend and GI may consider flexible sigmoidoscopy.  GI following and I appreciate the input and recommendations..   2.  Bloody bowel movement/probable  lower GI bleed Patient with bloody bowel movements per RN and per wife on 06/21/2018.Marland Kitchen  Hemoglobin currently at 7.9 from 7.9 from 7.7 from 8.7 from 9.9 on admission.  No further bloody bowel movements over the past 48 to 72 hours per wife.  Stools more soft. Patient with a history of metastatic lung cancer with bone mets and brain mets.  Patient also with a prior history of colonoscopy in 2011 with a 3 mm sessile polyp noted removed piecemeal with pathology consistent with tubular adenoma.  Patient also noted to have 2 other polyps with biopsies positive for tubular adenoma.  Patient placed on a PPI.  GI consulted and patient was to undergo colonoscopy today however patient unable to tolerate prep and as such colonoscopy canceled.  Hemoglobin currently stable.  Patient with no further rectal bleeding.  GI following and appreciate input and recommendation.  3.  Acute CVA Noted on MRI of head done on 06/21/2018.  Patient with lung cancer with mets to the brain and bone.  Patient also with probable hypercoagulable state.  MRI brain with an acute subcentimeter right parietal and left cerebellar infarcts in addition to metastatic disease and moderate parenchymal brain volume loss.  Patient with concerns for bloody bowel movements and as such unable to place on aspirin for stroke prophylaxis.  GI bleed work-up underway and patient was being prepped for colonoscopy today 06/23/2018 however patient unable to tolerate prep and as such colonoscopy canceled.  Patient with no noted further bleeding over the past 24 to 48 hours.  Stroke work-up done with MRA of the head  with no large vessel occlusion identified.  Short segment mild proximal right M1 stenosis, additional minor atherosclerotic change for patient age, no other hemodynamically significant or correctable stenosis identified.  2D echo with EF of 55 to 60%, no wall motion abnormalities, grade 1 diastolic dysfunction, mild aortic valvular stenosis/mild aortic valvular  regurgitation, mild mitral valvular regurgitation, moderately dilated left atrium, no cardiac source of emboli identified.  Carotid Dopplers with no significant ICA stenosis.  LDL of 101.  Patient on a statin.  Due to recent bleed with anemia full dose aspirin therapy was deferred initially..  Patient with no further bleeding.  Colonoscopy has been canceled as patient could not tolerate prep and as patient with no further bleeding with a stable hemoglobin no plans for colonoscopy at this time per gastroenterology.  Will change aspirin to enteric-coated aspirin 81 mg daily for secondary stroke prophylaxis.  Neurology following.    4.  Anemia Anemia panel consistent with anemia of chronic disease.  Hemoglobin was trending down and currently seems to have stabilized at 7.3 from 7.9 from 7.9 from 7.7 from 9.9 on admission from 10.1 and 06/02/2018.  Patient denies any further bleeding.  Transfusion threshold hemoglobin less than 7.  Patient being started on aspirin for acute CVA, monitor H&H.  GI following.   5.  Lung cancer with metastases to the brain and bone Patient being followed by Dr. Lorna Few with recent chemotherapy and immunotherapy.  Patient started on empiric steroids for possible immune mediated colitis.  Dr. Lorna Few was notified and per Dr. Posey Pronto will assess the patient.  Patient currently receiving radiation treatment.  Due to patient's confusion CT head was ordered however MRI of the brain has been ordered per radiation oncology and as such CT head was discontinued.  MRI head concerning for metastatic disease and acute CVA.  Patient started on Keppra for seizure prophylaxis.  Patient receiving radiation treatments.  Oncology informed via epic.  6.  Hypertension Stable.  Valsartan/HCTZ on hold secondary to presentation with diarrhea and lower blood pressure.  BP is stable.  Follow.   7.  Gastroesophageal reflux disease PPI.    8.  Hyperlipidemia LDL of 101.  Continue  statin.  9.  Acute kidney injury on chronic kidney disease stage III/acidosis Likely secondary to a prerenal azotemia in the setting of ARB and diuretic.  Antihypertensive medications on hold.  Renal function 2 and likely close to baseline.  Change IV fluids to bicarb due to acidosis.  Discontinue oral bicarb tablets.   10.  Acute metabolic encephalopathy Likely secondary to brain mets and dehydration and acute CVA noted on MRI brain which was done 06/21/2018.  Patient improving clinically.  Neurology consulted and are following.    11.  Hypermagnesemia Slowly trending down.  Magnesium magnesium level at 2.4.  Follow.  12.  Hypocalcemia/hypophosphatemia Likely secondary to vitamin D deficiency secondary to severe protein calorie malnutrition.  Check a intact PTH and a calcium level.  Magnesium level at 2.0.  Corrected calcium is 8.16.  Vitamin D levels pending.  Replete phosphorus.  Follow.    DVT prophylaxis: SCDs. Code Status: Full Family Communication: Updated patient and wife at bedside. Disposition Plan: Likely need skilled nursing facility versus home with home health, once medically stable.    Consultants:   Oncology via epic, Dr. Lorna Few  Gastroenterology: Dr. Rush Landmark 06/21/2018  Neurology: Dr. Cheral Marker 06/22/2018  Procedures:   CT abdomen and pelvis 06/19/2018  MRI brain 06/21/2018  Carotid Dopplers 06/22/2018  2D  echo 06/22/2018  MRA 06/22/2018  Antimicrobials:   None   Subjective: Patient sleeping comfortably.  Easily arousable.  Per wife patient still with poor oral intake.  No chest pain no shortness of breath.  No rectal bleeding.  No diarrhea.  Patient asking when he can go home.     Objective: Vitals:   06/24/18 2249 06/25/18 0200 06/25/18 0559 06/25/18 0824  BP: 132/72 136/72 128/79 139/79  Pulse: 90 94 (!) 102 98  Resp: (!) 24 16 16 14   Temp: 98 F (36.7 C) 98.8 F (37.1 C) 99.1 F (37.3 C) 98.5 F (36.9 C)  TempSrc: Oral Oral Oral  Oral  SpO2: 95% 94% 95% 96%  Weight:   65.2 kg   Height:        Intake/Output Summary (Last 24 hours) at 06/25/2018 1028 Last data filed at 06/25/2018 0554 Gross per 24 hour  Intake 1027.51 ml  Output 400 ml  Net 627.51 ml   Filed Weights   06/23/18 0500 06/24/18 0654 06/25/18 0559  Weight: 62.8 kg 66.1 kg 65.2 kg    Examination:  General exam: Sleeping comfortably. Respiratory system: Clear to auscultation bilaterally.  No wheezes, no crackles, no rhonchi.  Normal respiratory effort.  Cardiovascular system: RRR, no rubs or gallops.  Grade 3/6 systolic ejection murmur.  No lower extremity edema.  No JVD.   Gastrointestinal system: Abdomen is nontender, nondistended, soft, positive bowel sounds.  No rebound.  No guarding.  Central nervous system: Alert and oriented.  Cranial nerves II through XII grossly intact.  No focal deficits.  Sensation intact.  Gait not tested secondary to safety.  Extremities: Symmetric 5 x 5 power. Skin: No rashes, lesions or ulcers Psychiatry: Judgement and insight appear normal. Mood & affect appropriate.     Data Reviewed: I have personally reviewed following labs and imaging studies  CBC: Recent Labs  Lab 06/21/18 0634 06/22/18 0610 06/23/18 0334 06/24/18 0342 06/25/18 0432  WBC 4.6 2.5* 2.1* 2.7* 2.0*  NEUTROABS 3.9  --  1.6*  --  1.2*  HGB 8.7* 7.7* 7.9* 7.9* 7.3*  HCT 27.6* 23.7* 24.9* 25.1* 22.8*  MCV 74.0* 73.4* 74.1* 74.0* 73.8*  PLT 218 175 160 149* 469*   Basic Metabolic Panel: Recent Labs  Lab 06/19/18 1014 06/20/18 0614 06/21/18 0634 06/22/18 0610 06/23/18 0334 06/24/18 0342 06/25/18 0432 06/25/18 0840  NA 143 142 143 141 142 141 141  --   K 4.5 4.5 4.5 4.7 4.6 4.1 3.6  --   CL 110 110 114* 115* 116* 117* 118*  --   CO2 21* 22 19* 19* 18* 17* 15*  --   GLUCOSE 119* 132* 104* 121* 89 92 81  --   BUN 95* 79* 60* 53* 40* 37* 34*  --   CREATININE 2.36* 2.01* 1.80* 1.74* 1.73* 1.89* 2.00*  --   CALCIUM 8.9 7.8* 7.5*  7.1* 6.8* 6.5* 6.3*  --   MG 2.7* 2.5* 2.5* 2.4  --   --   --  2.0  PHOS  --  3.4  --   --   --   --   --  1.5*   GFR: Estimated Creatinine Clearance: 29.4 mL/min (A) (by C-G formula based on SCr of 2 mg/dL (H)). Liver Function Tests: Recent Labs  Lab 06/19/18 1014 06/20/18 0614 06/21/18 0634 06/22/18 0610  AST 30 24 21 20   ALT 69* 51* 42 36  ALKPHOS 184* 143* 132* 120  BILITOT 1.2 0.8 1.1 0.9  PROT 7.2  6.1* 6.0* 5.6*  ALBUMIN 2.4* 2.0* 1.9* 1.8*   Recent Labs  Lab 06/19/18 1014  LIPASE 20   No results for input(s): AMMONIA in the last 168 hours. Coagulation Profile: No results for input(s): INR, PROTIME in the last 168 hours. Cardiac Enzymes: No results for input(s): CKTOTAL, CKMB, CKMBINDEX, TROPONINI in the last 168 hours. BNP (last 3 results) No results for input(s): PROBNP in the last 8760 hours. HbA1C: Recent Labs    06/23/18 0334  HGBA1C 6.3*   CBG: Recent Labs  Lab 06/21/18 0824 06/22/18 0825 06/23/18 0756 06/24/18 0806 06/25/18 0823  GLUCAP 88 100* 76 83 75   Lipid Profile: Recent Labs    06/23/18 0334  CHOL 171  HDL 32*  LDLCALC 101*  TRIG 192*  CHOLHDL 5.3   Thyroid Function Tests: No results for input(s): TSH, T4TOTAL, FREET4, T3FREE, THYROIDAB in the last 72 hours. Anemia Panel: No results for input(s): VITAMINB12, FOLATE, FERRITIN, TIBC, IRON, RETICCTPCT in the last 72 hours. Sepsis Labs: No results for input(s): PROCALCITON, LATICACIDVEN in the last 168 hours.  Recent Results (from the past 240 hour(s))  Culture, Urine     Status: None   Collection Time: 06/19/18  2:16 PM  Result Value Ref Range Status   Specimen Description   Final    URINE, CLEAN CATCH Performed at [facility] [address] [place], [place] [address]    Special Requests   Final    NONE Performed at [facility], [place] [address], [place], [place] [address]    Culture   Final    NO GROWTH Performed at [facility], [place] [address] [place], [place] [address]    Report Status 06/20/2018 FINAL  Final  MRSA PCR Screening     Status: None   Collection Time: 06/22/18  2:19 PM  Result Value Ref Range Status   MRSA by PCR NEGATIVE NEGATIVE Final    Comment:        The GeneXpert MRSA Assay (FDA approved for NASAL specimens only), is one component of a comprehensive MRSA colonization surveillance program. It is not intended to diagnose MRSA infection nor to guide or monitor treatment for MRSA infections. Performed at [facility], [place] [address], [place], [place] [address]   Gastrointestinal Panel by PCR , Stool     Status: None   Collection Time: 06/23/18  4:50 AM  Result Value Ref Range Status   Campylobacter species NOT DETECTED NOT DETECTED Final   Plesimonas shigelloides NOT DETECTED NOT DETECTED Final   Salmonella species NOT DETECTED NOT DETECTED Final   Yersinia enterocolitica NOT DETECTED NOT DETECTED Final   Vibrio species NOT DETECTED NOT DETECTED Final   Vibrio cholerae NOT DETECTED NOT DETECTED Final   Enteroaggregative E coli (EAEC) NOT DETECTED NOT DETECTED Final   Enteropathogenic E coli (EPEC) NOT DETECTED NOT DETECTED Final   Enterotoxigenic E coli (ETEC) NOT DETECTED NOT DETECTED Final   Shiga like toxin producing E coli (STEC) NOT DETECTED NOT DETECTED Final   Shigella/Enteroinvasive E coli (EIEC) NOT DETECTED NOT DETECTED Final   Cryptosporidium NOT DETECTED NOT DETECTED Final   Cyclospora cayetanensis NOT DETECTED NOT DETECTED Final   Entamoeba histolytica NOT DETECTED NOT DETECTED Final   Giardia lamblia NOT DETECTED NOT DETECTED Final   Adenovirus F40/41 NOT DETECTED NOT DETECTED Final   Astrovirus NOT DETECTED NOT DETECTED Final   Norovirus GI/GII NOT DETECTED NOT DETECTED Final   Rotavirus A NOT  DETECTED NOT DETECTED Final   Sapovirus (I, II, IV, and V) NOT DETECTED NOT DETECTED Final    Comment: Performed at [facility], [address] [place], [place] [address]         Radiology Studies: No results found.      Scheduled Meds: . aspirin  325 mg Oral Daily  . bisacodyl  10 mg Rectal Once  . levETIRAcetam  500 mg Oral BID  . lip balm      . pantoprazole  40 mg Oral Daily  . peg 3350 powder  0.5 kit Oral Once  . predniSONE  40 mg Oral QAC breakfast  . simvastatin  40 mg Oral QPM   Continuous Infusions: . dextrose 5 % 1,000 mL with sodium bicarbonate 150 mEq infusion 100 mL/hr at 06/25/18 0936  . potassium PHOSPHATE IVPB (in mmol)       LOS: 6 days    Time spent: 40 minutes    Irine Seal, MD [facility] Pager [phone]  If 7PM-7AM, please contact night-coverage [email or web address] Password [facility] 06/25/2018, 10:28 AM

## 2018-06-25 NOTE — Progress Notes (Signed)
Patient coughing up several blood clots, states he's been doing this his whole life. Dr. Grandville Silos notified.

## 2018-06-25 NOTE — Progress Notes (Signed)
Patient's wife came to the nursing station and told me Zachary Hobbs has been saying to her that he wants to end his life, that he's tired. Patient has guns at home and wife has hid them. She thinks that is why he is so anxious to go home. Dr. Grandville Silos made aware of situation. Patient is having a 1:1 sitter now.

## 2018-06-26 ENCOUNTER — Ambulatory Visit: Payer: 59 | Admitting: Radiation Oncology

## 2018-06-26 ENCOUNTER — Telehealth: Payer: Self-pay | Admitting: Medical Oncology

## 2018-06-26 DIAGNOSIS — C3490 Malignant neoplasm of unspecified part of unspecified bronchus or lung: Secondary | ICD-10-CM

## 2018-06-26 DIAGNOSIS — K529 Noninfective gastroenteritis and colitis, unspecified: Secondary | ICD-10-CM

## 2018-06-26 DIAGNOSIS — G893 Neoplasm related pain (acute) (chronic): Secondary | ICD-10-CM

## 2018-06-26 DIAGNOSIS — Z515 Encounter for palliative care: Secondary | ICD-10-CM

## 2018-06-26 DIAGNOSIS — Z66 Do not resuscitate: Secondary | ICD-10-CM

## 2018-06-26 DIAGNOSIS — C7951 Secondary malignant neoplasm of bone: Secondary | ICD-10-CM | POA: Insufficient documentation

## 2018-06-26 LAB — CBC WITH DIFFERENTIAL/PLATELET
BASOS PCT: 0 %
Basophils Absolute: 0 10*3/uL (ref 0.0–0.1)
Eosinophils Absolute: 0 10*3/uL (ref 0.0–0.7)
Eosinophils Relative: 2 %
HEMATOCRIT: 22.2 % — AB (ref 39.0–52.0)
HEMOGLOBIN: 7.3 g/dL — AB (ref 13.0–17.0)
Lymphocytes Relative: 20 %
Lymphs Abs: 0.3 10*3/uL — ABNORMAL LOW (ref 0.7–4.0)
MCH: 23.9 pg — AB (ref 26.0–34.0)
MCHC: 32.9 g/dL (ref 30.0–36.0)
MCV: 72.5 fL — ABNORMAL LOW (ref 78.0–100.0)
MONOS PCT: 25 %
Monocytes Absolute: 0.4 10*3/uL (ref 0.1–1.0)
NEUTROS ABS: 0.9 10*3/uL — AB (ref 1.7–7.7)
NEUTROS PCT: 53 %
Platelets: 147 10*3/uL — ABNORMAL LOW (ref 150–400)
RBC: 3.06 MIL/uL — ABNORMAL LOW (ref 4.22–5.81)
RDW: 16.9 % — ABNORMAL HIGH (ref 11.5–15.5)
WBC: 1.7 10*3/uL — ABNORMAL LOW (ref 4.0–10.5)

## 2018-06-26 LAB — GLUCOSE, CAPILLARY: GLUCOSE-CAPILLARY: 148 mg/dL — AB (ref 70–99)

## 2018-06-26 LAB — RENAL FUNCTION PANEL
Albumin: 1.6 g/dL — ABNORMAL LOW (ref 3.5–5.0)
Anion gap: 7 (ref 5–15)
BUN: 30 mg/dL — ABNORMAL HIGH (ref 8–23)
CALCIUM: 6 mg/dL — AB (ref 8.9–10.3)
CO2: 20 mmol/L — AB (ref 22–32)
Chloride: 112 mmol/L — ABNORMAL HIGH (ref 98–111)
Creatinine, Ser: 2.21 mg/dL — ABNORMAL HIGH (ref 0.61–1.24)
GFR calc non Af Amer: 27 mL/min — ABNORMAL LOW (ref >60)
GFR, EST AFRICAN AMERICAN: 32 mL/min — AB (ref >60)
GLUCOSE: 191 mg/dL — AB (ref 70–99)
POTASSIUM: 3.1 mmol/L — AB (ref 3.5–5.1)
Phosphorus: 1.6 mg/dL — ABNORMAL LOW (ref 2.5–4.6)
SODIUM: 139 mmol/L (ref 135–145)

## 2018-06-26 LAB — PHOSPHORUS: Phosphorus: 1.6 mg/dL — ABNORMAL LOW (ref 2.5–4.6)

## 2018-06-26 MED ORDER — LEVETIRACETAM 500 MG PO TABS: 500.0000 mg | ORAL_TABLET | Freq: Two times a day (BID) | ORAL | 0 refills | Status: AC

## 2018-06-26 MED ORDER — ACETAMINOPHEN 325 MG PO TABS: 650.0000 mg | ORAL_TABLET | Freq: Four times a day (QID) | ORAL | Status: AC | PRN

## 2018-06-26 MED ORDER — MORPHINE SULFATE (CONCENTRATE) 10 MG/0.5ML PO SOLN
5.0000 mg | ORAL | Status: DC | PRN
  Administered 2018-06-26: 5 mg via ORAL
  Filled 2018-06-26: qty 0.5

## 2018-06-26 MED ORDER — LORAZEPAM 1 MG PO TABS: 1.0000 mg | ORAL_TABLET | ORAL | 0 refills | Status: AC | PRN

## 2018-06-26 MED ORDER — POTASSIUM PHOSPHATES 15 MMOLE/5ML IV SOLN
40.0000 mmol | Freq: Once | INTRAVENOUS | Status: AC
  Administered 2018-06-26: 40 mmol via INTRAVENOUS
  Filled 2018-06-26: qty 13.33

## 2018-06-26 MED ORDER — CALCIUM CARBONATE 1250 (500 CA) MG PO TABS: 1.0000 | ORAL_TABLET | Freq: Three times a day (TID) | ORAL | Status: DC

## 2018-06-26 MED ORDER — MORPHINE SULFATE (CONCENTRATE) 10 MG/0.5ML PO SOLN: 5.0000 mg | ORAL | 0 refills | Status: AC | PRN

## 2018-06-26 MED ORDER — LORAZEPAM 1 MG PO TABS: 1.0000 mg | ORAL_TABLET | ORAL | Status: DC | PRN

## 2018-06-26 MED ORDER — SODIUM CHLORIDE 0.9 % IV SOLN
2.0000 g | Freq: Once | INTRAVENOUS | Status: DC
  Filled 2018-06-26: qty 20

## 2018-06-26 MED ORDER — PREDNISONE 20 MG PO TABS: 20.0000 mg | ORAL_TABLET | Freq: Every day | ORAL | 0 refills | Status: AC

## 2018-06-26 MED ORDER — POTASSIUM CHLORIDE CRYS ER 20 MEQ PO TBCR: 40.0000 meq | EXTENDED_RELEASE_TABLET | Freq: Once | ORAL | Status: DC

## 2018-06-26 MED ORDER — OMEPRAZOLE MAGNESIUM 20 MG PO TBEC: 40.0000 mg | DELAYED_RELEASE_TABLET | Freq: Every day | ORAL | 0 refills | Status: AC

## 2018-06-26 MED ORDER — BISACODYL 10 MG RE SUPP: 10.0000 mg | Freq: Every day | RECTAL | 0 refills | Status: AC | PRN

## 2018-06-26 MED ORDER — SODIUM CHLORIDE 0.9 % IV SOLN
INTRAVENOUS | Status: DC | PRN
  Administered 2018-06-26: 250 mL via INTRAVENOUS

## 2018-06-26 NOTE — Progress Notes (Addendum)
[facility]) [facility] Liaison RN visit   [facility] notified by Purcell Mouton, CMRN of patient/family request for [facility] services at home after discharge. Chart and patient information under review by [facility] physician. Hospice eligibility pending at this time.   Writer spoke with patient and patient wife Zachary Hobbs) at bedside to initiate education related to hospice philosophy, services and team approach to care. Patient and wife verbalized understanding of information given. Per discussion, plan is for discharge to home by [facility] this afternoon.   Please send signed and completed DNR form home with patient/family.  Patient will need prescriptions for discharge comfort medications.  DME have been discussed, patient currently has a Bedside commode in the home. Patient/family requests the following DME for delivery to the home: a hospital bed and over the bed table.  [facility] DME specialist has been notified and will contact [name] to arrange delivery to the home. Home address has been verified and is correct in the chart. Zachary Hobbs is the family member to contact to arrange time of delivery.   [facility] Referral [facility] is aware of the above. Please notify [facility] when patient is ready to leave the unit at discharge.  (Call [phone] between 8:30am and 5:00pm. And call [phone] after 5:00pm).  [facility] information and contact numbers given to patient and patient wife during this visit.  Above information shared with [facility] CMRN.   Please call with hospice related questions.  Thank you for this referral,  Gar Ponto, [facility] Liaison [place] found on AMION  Update: Patient is hospice eligible per [facility] Physician Dr. Tomasa Hosteller

## 2018-06-26 NOTE — Progress Notes (Signed)
RN assessed thoughts of harming self or others at this time. Pt states, "I don't know". Family is aware of suicide precautions. Family also wanting to speak with MD about speaking with palliative and making pt full comfort care.

## 2018-06-26 NOTE — Progress Notes (Signed)
CRITICAL VALUE ALERT  Critical Value:  Parathyroid hormone intact test - Ca 6.4  Date & Time Notied:  06/26/18  Provider Notified: [facility]  Orders Received/Actions taken: Proceed with plans for d/c home with hospice.

## 2018-06-26 NOTE — Telephone Encounter (Signed)
Julien Nordmann will be attending for pt. Left message.

## 2018-06-26 NOTE — Consult Note (Signed)
Consultation Note Date: 06/26/2018   Patient Name: Zachary Hobbs  DOB: [birth year]  MRN: [id number]  Age / Sex: 77 y.o., male  PCP: Janie Morning, DO Referring Physician: Eugenie Filler, MD  Reason for Consultation: Establishing goals of care and Psychosocial/spiritual support  HPI/Patient Profile: 77 y.o. male  admitted on 06/19/2018 with medical history significant of metastatic small cell lung cancer, hypertension, CKD stage III, GERD, hyperlipidemia, anemia, admitted thru the ER  with a chief complaint of diarrhea.    Patient states that Friday he had chemotherapy and immunotherapy and then Saturday morning he started having some intermittent abdominal pain that was diffuse and crampy along with significant loose watery diarrhea.   Patient and his wife understand the limited prognosis with or without treatment and patient has expressed to his wife that he does not want any further treatment, diagnostics or hospital days. He wishes to go home and allow a natural death.  "I want to go today"  Patient and his wife face his human  mortality, natural progression at EOL and anticipatory care needs.   Clinical Assessment and Goals of Care:  This NP Wadie Lessen reviewed medical records, received report from team, assessed the patient and then meet at the patient's bedside along with his wife  to discuss diagnosis, prognosis, GOC, EOL wishes disposition and options.  Concept of Hospice and Palliative Care were discussed  A detailed discussion was had today regarding advanced directives.  Concepts specific to code status, artifical feeding and hydration, continued IV antibiotics and rehospitalization was had.  The difference between a aggressive medical intervention path  and a palliative comfort care path for this patient at this time was had.  Values and goals of care important to patient and family were  attempted to be elicited.  MOST form completed to reflect full comfort path  Natural trajectory and expectations at EOL were discussed.  Questions and concerns addressed.   Family encouraged to call with questions or concerns.    PMT will continue to support holistically.    NEXT OF KIN    SUMMARY OF RECOMMENDATIONS    Code Status/Advance Care Planning:  DNR--documented today   Symptom Management:   Pain/Dyspnea: Roxanol 5 mg po/sl every 2 hrs prn  Anxiety: Ativan 1 mg po/sl every 4 hrs prn   Palliative Prophylaxis:   Bowel Regimen, Frequent Pain Assessment and Oral Care  Additional Recommendations (Limitations, Scope, Preferences):  Full Comfort Care   Dc IV fluids/medciation when ready for discharge  Psycho-social/Spiritual:   Desire for further Chaplaincy support:yes  Additional Recommendations: Education on Hospice and Grief/Bereavement Support  Prognosis: - less than 4 weeks   Discharge Planning: Home with Hospice      Primary Diagnoses: Present on Admission: . Diarrhea . Essential hypertension . HLD (hyperlipidemia) . Malignant neoplasm of upper lobe of left lung ([place]) . Microcytic anemia . Hypermagnesemia . Lower GI bleed . Constipation . Pressure injury of skin . Severe dehydration . Hypocalcemia   I have reviewed the medical record, interviewed  the patient and family, and examined the patient. The following aspects are pertinent.  Past Medical History:  Diagnosis Date  . Cancer ([place])    small cell carcinoma lung  . Hypertension    Social History   Socioeconomic History  . Marital status: Married    Spouse name: Not on file  . Number of children: Not on file  . Years of education: Not on file  . Highest education level: Not on file  Occupational History  . Not on file  Social Needs  . Financial resource strain: Not hard at all  . Food insecurity:    Worry: Never true    Inability: Never true  . Transportation needs:     Medical: No    Non-medical: No  Tobacco Use  . Smoking status: Never Smoker  . Smokeless tobacco: Never Used  Substance and Sexual Activity  . Alcohol use: No  . Drug use: No  . Sexual activity: Not on file  Lifestyle  . Physical activity:    Days per week: 0 days    Minutes per session: 0 min  . Stress: Only a little  Relationships  . Social connections:    Talks on phone: More than three times a week    Gets together: More than three times a week    Attends religious service: Never    Active member of club or organization: No    Attends meetings of clubs or organizations: Never    Relationship status: Married  Other Topics Concern  . Not on file  Social History Narrative  . Not on file   Family History  Problem Relation Age of Onset  . Mesothelioma Sister    Scheduled Meds: . levETIRAcetam  500 mg Oral BID  . pantoprazole  40 mg Oral Daily  . predniSONE  40 mg Oral QAC breakfast   Continuous Infusions: . potassium PHOSPHATE IVPB (in mmol)    .  sodium bicarbonate  infusion 1000 mL 100 mL/hr at 06/26/18 0456   PRN Meds:.acetaminophen **OR** acetaminophen (TYLENOL) oral liquid 160 mg/5 mL **OR** acetaminophen, acetaminophen **OR** acetaminophen, alum & mag hydroxide-simeth, bisacodyl, meclizine, ondansetron **OR** ondansetron (ZOFRAN) IV, oxyCODONE-acetaminophen, senna-docusate, traMADol Medications Prior to Admission:  Prior to Admission medications   Medication Sig Start Date End Date Taking? Authorizing Provider  aspirin EC 81 MG tablet Take 81 mg by mouth daily.   Yes [provider]  meclizine (ANTIVERT) 50 MG tablet Take 1 tablet (50 mg total) by mouth 3 (three) times daily as needed. Patient taking differently: Take 50 mg by mouth 3 (three) times daily as needed for dizziness.  12/19/15  Yes Mackuen, Courteney Lyn, MD  omeprazole (PRILOSEC OTC) 20 MG tablet Take 20 mg by mouth daily.   Yes [provider]  ondansetron (ZOFRAN) 8 MG tablet Take 1  tablet (8 mg total) by mouth every 8 (eight) hours as needed for nausea or vomiting. 06/08/18  Yes Hayden Pedro, PA-C  oxyCODONE-acetaminophen (PERCOCET/ROXICET) 5-325 MG tablet Take 1 tablet by mouth every 6 (six) hours as needed for severe pain. 05/31/18  Yes Curcio, Roselie Awkward, NP  prochlorperazine (COMPAZINE) 10 MG tablet Take 1 tablet (10 mg total) by mouth every 6 (six) hours as needed for nausea or vomiting. 06/09/18  Yes Curt Bears, MD  simvastatin (ZOCOR) 40 MG tablet Take 40 mg by mouth every evening.   Yes [provider]  traMADol (ULTRAM) 50 MG tablet Take 25-50 mg by mouth 3 (three) times daily  as needed for moderate pain.  05/15/18  Yes [provider]  valsartan-hydrochlorothiazide (DIOVAN-HCT) 320-25 MG tablet Take 1 tablet by mouth daily. 12/05/15  Yes [provider]   Allergies  Allergen Reactions  . Rosuvastatin     REACTION: myalgia   Review of Systems  Psychiatric/Behavioral: Negative for suicidal ideas.       Patient denies any suicidal ideation at this time    Physical Exam  Constitutional: He appears lethargic. He appears cachectic. He appears ill.  Cardiovascular: Tachycardia present.  Pulmonary/Chest: He has decreased breath sounds.  Neurological: He appears lethargic.  Skin: Skin is warm and dry.  Psychiatric: He expresses no suicidal plans and no homicidal plans.   - patient denies any suicidal ideation or plan  ( wife tells me that he is simply "ready to go home and let go") both understand the limited prognosis  - all weapons at home have been secured    Vital Signs: BP 132/82 (BP Location: Right Arm)   Pulse (!) 101   Temp 98.6 F (37 C) (Oral)   Resp 12   Ht 5\' 8"  (1.727 m)   Wt 63.8 kg   SpO2 95%   BMI 21.38 kg/m  Pain Scale: 0-10 POSS *See Group Information*: S-Acceptable,Sleep, easy to arouse Pain Score: Asleep   SpO2: SpO2: 95 % O2 Device:SpO2: 95 % O2 Flow Rate: .   IO: Intake/output summary:    Intake/Output Summary (Last 24 hours) at 06/26/2018 6435 Last data filed at 06/25/2018 2100 Gross per 24 hour  Intake 704.19 ml  Output 200 ml  Net 504.19 ml    LBM: Last BM Date: 06/26/18 Baseline Weight: Weight: 62.1 kg Most recent weight: Weight: 63.8 kg     Palliative Assessment/Data: 30 %    Discussed with Dr Grandville Silos   Time In: 0730 Time Out: 0845 Time Total: 75 minutes Greater than 50%  of this time was spent counseling and coordinating care related to the above assessment and plan.  Signed by: Wadie Lessen, NP   Please contact Palliative Medicine Team phone at [phone] for questions and concerns.  For individual provider: See Shea Evans

## 2018-06-26 NOTE — Discharge Summary (Signed)
Physician Discharge Summary  Zachary Hobbs:[id number] DOB: 04/02/1941 DOA: 06/19/2018  PCP: Janie Morning, DO  Admit date: 06/19/2018 Discharge date: 06/26/2018  Time spent: 65 minutes  Recommendations for Outpatient Follow-up:  1. Patient will be discharged home with hospice following.  Patient be discharged with a MOST form. 2. Outpatient follow-up with Dr. Lorna Few in 1 to 2 weeks.   Discharge Diagnoses:  Active Problems:   HLD (hyperlipidemia)   Essential hypertension   Malignant neoplasm of upper lobe of left lung (HCC)   Diarrhea   GERD (gastroesophageal reflux disease)   Microcytic anemia   Acute kidney injury superimposed on chronic kidney disease (HCC)   Hypermagnesemia   Elevated AST (SGOT)   Confusion   Lower GI bleed   Constipation   Acute encephalopathy   Acute CVA (cerebrovascular accident) ([place])   Pressure injury of skin   Severe dehydration   Hypocalcemia   Metastatic lung cancer (metastasis from lung to other site), unspecified laterality ([place])   DNR (do not resuscitate)   Palliative care by specialist   Cancer associated pain   Discharge Condition: Stable  Diet recommendation: Comfort feeds/regular.  Filed Weights   06/24/18 0654 06/25/18 0559 06/26/18 0505  Weight: 66.1 kg 65.2 kg 63.8 kg    History of present illness:  Per Dr Alfredia Ferguson  Zachary Hobbs is a 77 y.o. male with medical history significant of metastatic small cell lung cancer, hypertension, CKD stage III, GERD, hyperlipidemia, anemia, and other comorbidities who presented with a chief complaint of diarrhea.  Patient stated that Friday he had chemotherapy and immunotherapy and then Saturday morning he started having some intermittent abdominal pain that was diffuse and crampy along with significant loose watery diarrhea that is now improved slightly and is now more soft and "mushy".  Patient's wife is at bedside and provided some of the history as patient is slightly confused.   Denied any nausea, vomiting, chest pain, shortness breath, burning or discomfort in the urinary tract.  Did have some dizziness and did fall once likely secondary to dehydration.  ED discussed with patient's primary oncologist who felt this may be an immune mediated.  Because of significant amount of diarrhea as patient is going almost every hour or 30 minutes [facility] was called to admit this patient to rule out colitis.  ED Course: Had basic blood work done and was given 2 L of lactated Ringer's.  Patient's primary oncologist was called who felt this was immune mediated so recommended admission and starting prednisone 1 make per cake  Hospital Course:  1??  Diarrhea with abdominal tenderness likely secondary to constipation versus immune mediated/chemotherapy mediated Patient noted on CT abdomen and pelvis with formed stool and contrast medium in the distal colon with no dilated bowel and no significant bowel wall thickening to suggest colitis.  Patient with bloody bowel movements on 06/20/2018 and 06/21/2018.  Per wife no further bloody bowel movements and consistency of stool is improving per wife.  C. difficile PCR was canceled.  GI pathogen panel ordered and negative.  Due to concern for inflammatory diarrhea patient started on IV methylprednisone instead of oral prednisone per gastroenterology initially.  Patient was subsequently transitioned to oral prednisone 40 mg daily as recommended per GI.  Patient was seen in consultation by palliative care and patient has been transitioned to full comfort measures.  Patient be discharged home on a prednisone taper.   2.  Bloody bowel movement/probable lower GI bleed Patient with bloody bowel movements  per RN and per wife on 06/21/2018.Marland Kitchen  Hemoglobin stablized at 7.3.  No further bloody bowel movements over the past 2-3 days prior to discharger. Stools more soft. Patient with a history of metastatic lung cancer with bone mets and brain mets.  Patient also with a  prior history of colonoscopy in 2011 with a 3 mm sessile polyp noted removed piecemeal with pathology consistent with tubular adenoma.  Patient also noted to have 2 other polyps with biopsies positive for tubular adenoma.  Patient placed on a PPI.  GI consulted and patient was to undergo colonoscopy, however patient unable to tolerate prep and as such colonoscopy canceled.  Hemoglobin stabilized.  Patient had no further rectal bleeding.  Patient was placed on steroids and subsequently transition to oral prednisone taper.  Patient was seen by GI.  Patient subsequently transition to full comfort measures.  Patient will be discharged home with hospice.    3.  Acute CVA Noted on MRI of head done on 06/21/2018.  Patient with lung cancer with mets to the brain and bone.  Patient also with probable hypercoagulable state.  MRI brain with an acute subcentimeter right parietal and left cerebellar infarcts in addition to metastatic disease and moderate parenchymal brain volume loss.  Patient with concerns for bloody bowel movements and as such unable to place on aspirin for stroke prophylaxis.  GI bleed work-up underway and patient was being prepped for colonoscopy today 06/23/2018 however patient unable to tolerate prep and as such colonoscopy canceled.  Patient with no noted further bleeding over the past 24 to 48 hours.  Stroke work-up done with MRA of the head with no large vessel occlusion identified.  Short segment mild proximal right M1 stenosis, additional minor atherosclerotic change for patient age, no other hemodynamically significant or correctable stenosis identified.  2D echo with EF of 55 to 60%, no wall motion abnormalities, grade 1 diastolic dysfunction, mild aortic valvular stenosis/mild aortic valvular regurgitation, mild mitral valvular regurgitation, moderately dilated left atrium, no cardiac source of emboli identified.  Carotid Dopplers with no significant ICA stenosis.  LDL of 101.  Patient on a  statin.  Due to recent bleed with anemia full dose aspirin therapy was deferred initially..  Patient with no further bleeding.  Colonoscopy has been canceled as patient could not tolerate prep and as patient with no further bleeding with a stable hemoglobin no plans for colonoscopy at this time per gastroenterology.    Patient was subsequently started on full dose aspirin and changed to aspirin 81 mg daily for secondary stroke prophylaxis.  Patient was seen by neurology during the hospitalization.  Patient has been transitioned to full comfort measures only discharged home with hospice following.   4.  Anemia Anemia panel consistent with anemia of chronic disease.  Hemoglobin was trending down and stabilized at 7.3 from 7.9 from 7.9 from 7.7 from 9.9 on admission from 10.1 and 06/02/2018.  Patient initially had some bleeding which subsequently resolved as patient was started on steroids.  Patient was seen by GI.  Patient was placed on aspirin secondary to acute CVA.  Patient now has transitioned to full comfort measures and will be discharged home with hospice following.  5.  Lung cancer with metastases to the brain and bone Patient being followed by Dr. Lorna Few with recent chemotherapy and immunotherapy.  Patient started on empiric steroids for possible immune mediated colitis.  Dr. Lorna Few was notified and per Dr. Posey Pronto will assess the patient.  Patient currently receiving  radiation treatment.  Due to patient's confusion CT head was ordered however MRI of the brain has been ordered per radiation oncology and as such CT head was discontinued.  MRI head concerning for metastatic disease and acute CVA.  Patient started on Keppra for seizure prophylaxis.  Patient receiving radiation treatments.  Oncology informed via epic.  Patient was seen by palliative care and decision was made to transition to full comfort measures.  Patient be discharged home with hospice following.  6.   Hypertension Stable.  Valsartan/HCTZ was held secondary to presentation with diarrhea and lower blood pressure.  BP stable.  Antihypertensive medication was discontinued on discharge.  Patient transition to full comfort measures.  Patient to be discharged home with hospice.    7.  Gastroesophageal reflux disease Patient maintained on a PPI.    8.  Hyperlipidemia LDL of 101.    Patient maintained on a statin during the hospitalization which will be discontinued on discharge as patient now transitioned to full comfort measures.    9.  Acute kidney injury on chronic kidney disease stage III/acidosis Likely secondary to a prerenal azotemia in the setting of ARB and diuretic.  Antihypertensive medications were held.  Renal function improved and was close to baseline with a creatinine of 2.21.  Patient was initially placed on bicarb tablets and subsequently bicarb drip.  Acidosis improved.  Patient was seen by palliative care and patient made full comfort measures.  Patient will be discharged home with hospice following.    10.  Acute metabolic encephalopathy Likely secondary to brain mets and dehydration and acute CVA noted on MRI brain which was done 06/21/2018.  Patient improved clinically and was close to baseline by day of discharge.  Patient was seen by neurology due to acute CVA.  Patient now full comfort measures.  Outpatient follow-up.  11.  Hypermagnesemia Slowly trended down.  12.  Hypocalcemia/hypophosphatemia Likely secondary to vitamin D deficiency secondary to severe protein calorie malnutrition.  A intact PTH and a calcium level was checked.  Magnesium level at 2.0.  Corrected calcium is 8.16.  Vitamin D levels were depressed at 13.6.  Phosphorus level where being repleted.  Patient with a poor prognosis.  Patient seen by palliative care and decision was made for full comfort measures.  Patient will be discharged to home with hospice following.      Procedures:  CT abdomen  and pelvis 06/19/2018  MRI brain 06/21/2018  Carotid Dopplers 06/22/2018  2D echo 06/22/2018  MRA 06/22/2018   Consultations:  Oncology via epic, Dr. Lorna Few  Gastroenterology: Dr. Rush Landmark 06/21/2018  Neurology: Dr. Cheral Marker 06/22/2018  Palliative care: Wadie Lessen, NP 06/26/2018   Discharge Exam: Vitals:   06/26/18 0633 06/26/18 1402  BP: 132/82 (!) 144/77  Pulse: (!) 101 (!) 106  Resp: 12 16  Temp: 98.6 F (37 C) 98.7 F (37.1 C)  SpO2: 95% 94%    General: NAD Cardiovascular: RRR WITH 3/6 SEM Respiratory: CTAB  Discharge Instructions   Discharge Instructions    Diet general   Complete by:  As directed    Increase activity slowly   Complete by:  As directed      Allergies as of 06/26/2018      Reactions   Rosuvastatin    REACTION: myalgia      Medication List    STOP taking these medications   oxyCODONE-acetaminophen 5-325 MG tablet Commonly known as:  PERCOCET/ROXICET   simvastatin 40 MG tablet Commonly known as:  ZOCOR  valsartan-hydrochlorothiazide 320-25 MG tablet Commonly known as:  DIOVAN-HCT     TAKE these medications   acetaminophen 325 MG tablet Commonly known as:  TYLENOL Take 2 tablets (650 mg total) by mouth every 6 (six) hours as needed for mild pain (or Fever >/= 101).   aspirin EC 81 MG tablet Take 81 mg by mouth daily.   bisacodyl 10 MG suppository Commonly known as:  DULCOLAX Place 1 suppository (10 mg total) rectally daily as needed for moderate constipation.   levETIRAcetam 500 MG tablet Commonly known as:  KEPPRA Take 1 tablet (500 mg total) by mouth 2 (two) times daily.   LORazepam 1 MG tablet Commonly known as:  ATIVAN Take 1 tablet (1 mg total) by mouth every 4 (four) hours as needed for anxiety or sleep.   meclizine 50 MG tablet Commonly known as:  ANTIVERT Take 1 tablet (50 mg total) by mouth 3 (three) times daily as needed. What changed:  reasons to take this   morphine CONCENTRATE 10 MG/0.5ML  Soln concentrated solution Take 0.25 mLs (5 mg total) by mouth every 2 (two) hours as needed for moderate pain or shortness of breath.   omeprazole 20 MG tablet Commonly known as:  PRILOSEC OTC Take 2 tablets (40 mg total) by mouth daily. What changed:  how much to take   ondansetron 8 MG tablet Commonly known as:  ZOFRAN Take 1 tablet (8 mg total) by mouth every 8 (eight) hours as needed for nausea or vomiting.   predniSONE 20 MG tablet Commonly known as:  DELTASONE Take 1-2 tablets (20-40 mg total) by mouth daily before breakfast. TAKE 2 TABLETS (40MG ) DAILY X 4 DAYS, THEN 1 TABLET (20MG ) DAILY X 4 DAYS THEN STOP. Start taking on:  06/27/2018   prochlorperazine 10 MG tablet Commonly known as:  COMPAZINE Take 1 tablet (10 mg total) by mouth every 6 (six) hours as needed for nausea or vomiting.   traMADol 50 MG tablet Commonly known as:  ULTRAM Take 25-50 mg by mouth 3 (three) times daily as needed for moderate pain.      Allergies  Allergen Reactions  . Rosuvastatin     REACTION: myalgia   Follow-up Information    Curt Bears, MD. Schedule an appointment as soon as possible for a visit in 1 week(s).   Specialty:  Oncology Why:  F/U IN 1-2 WEEKS. Contact information: [place] [address] [phone]            The results of significant diagnostics from this hospitalization (including imaging, microbiology, ancillary and laboratory) are listed below for reference.    Significant Diagnostic Studies: Ct Abdomen Pelvis Wo Contrast  Result Date: 06/19/2018 CLINICAL DATA:  Small cell carcinoma of the lung, inpatient for chemotherapy in immunotherapy, abdominal pain EXAM: CT ABDOMEN AND PELVIS WITHOUT CONTRAST TECHNIQUE: Multidetector CT imaging of the abdomen and pelvis was performed following the standard protocol without IV contrast. COMPARISON:  Multiple exams, including PET-CT from 05/29/2018 FINDINGS: Lower chest: Numerous metastatic  lesions in the lung bases. Index lesion in the left lower lobe posterior basal segment measures 3.0 by 2.2 cm, formerly 2.9 by 2.2 cm. Increased pleural thickening at the left lung base posteriorly, possibly from pleural tumor. Hepatobiliary: Accentuated density in the gallbladder possibly from sludge. Low sensitivity for early metastatic disease to the liver due to lack of IV contrast. Pancreas: Unremarkable Spleen: Unremarkable Adrenals/Urinary Tract: Previously hypermetabolic right adrenal mass 1.6 cm transverse, previously 1.5 cm. Previously hypermetabolic left  adrenal mass 2.6 by 2.1 cm on image 18/2, formerly 1.8 by 2.5 cm. No urinary tract calculi observed. The renal contours appear normal. Stomach/Bowel: Small amount of retained contrast medium in the colon. No dilated bowel. There is some formed stool in the distal colon. No significant degree of bowel wall thickening identified. Vascular/Lymphatic: Aortoiliac atherosclerotic vascular disease. No pathologic adenopathy identified. Reproductive: Unremarkable Other: New presacral edema. Musculoskeletal: The patient has known widespread osseous metastatic disease much more conspicuous on prior PET-CT than on today's diagnostic CT examination. There are some subtle lucencies corresponding such to some of the known lesions (for example, the right pubic bone and the left ninth rib laterally) but most of the lesions are essentially completely occult. There is mild presacral edema but I do not see a definite sacral fracture. Lumbar spondylosis and degenerative disc disease cause moderate bilateral foraminal stenosis at L5-S1. IMPRESSION: 1. There is some formed stool and contrast medium in the distal colon. No dilated bowel. No significant abnormal bowel wall thickening to suggest over colitis. 2. Increased pleural thickening at the left lung base posteriorly, potentially from pleural tumor. 3. New presacral edema. No definite sacral fracture is identified. Cause of  this presacral edema is uncertain. 4. The patient has known innumerable bony lesions based on recent PET-CT, but these are for the most part occult on today's diagnostic CT. 5. Innumerable pulmonary nodules and masses in the lung bases, essentially stable. 6. Bilateral metastatic lesions in the adrenal glands, stable. 7. Bilateral foraminal impingement at L5-S1. 8.  Aortic Atherosclerosis (ICD10-I70.0). Electronically Signed   By: Van Clines M.D.   On: 06/19/2018 17:28   Dg Chest 2 View  Result Date: 06/22/2018 CLINICAL DATA:  Stroke.  Hypertension.  Lung cancer. EXAM: CHEST - 2 VIEW COMPARISON:  06/02/2018 FINDINGS: Mild cardiomegaly. Small effusions in the posterior costophrenic angles. Progression of intrathoracic metastatic disease. Largest mass in the left lateral mid chest now measures 6.2 cm as opposed to 5.7 cm previously. Innumerable metastatic nodules throughout both lungs as seen previously. IMPRESSION: Small effusions in the posterior costophrenic angles. Enlargement of the dominant mass density in the left lateral chest. Innumerable widespread bilateral pulmonary metastatic nodules. Electronically Signed   By: Nelson Chimes M.D.   On: 06/22/2018 10:35   Mr Brain Wo Contrast  Result Date: 06/07/2018 CLINICAL DATA:  New diagnosis lung cancer. Patient not able to tolerate complete imaging including contrast administration. EXAM: MRI HEAD WITHOUT CONTRAST TECHNIQUE: Multiplanar, multiecho pulse sequences of the brain and surrounding structures were obtained without intravenous contrast. COMPARISON:  05/29/2018.  CT 02/12/2012. FINDINGS: Brain: The patient was not able to tolerate complete imaging. No metastatic lesion is suspected in the cerebellum. There is a large venous angioma on the right seen previously, not clinically significant. With respect to the cerebral hemispheres, there is a 2 cm mass in the right temporal lobe consistent with a metastasis. Question the presence of a 7 mm  metastasis at a right posterior frontal gyrus at the vertex. In the left hemisphere, there is a 7-8 mm metastasis in the region of the corpus callosum or subependymal brain, bulging into the body of the left lateral ventricle. There is an 8 mm metastasis at the left parietal vertex. There is some associated edema, particularly related to the right temporal metastasis. No mass effect or shift however. No sign of hemorrhage. No sign of acute or subacute infarction. Ordinary chronic small-vessel ischemic changes affect the white matter. No sign of obstructive hydrocephalus or extra-axial collection.  Vascular: Major vessels at the base of the brain show flow. Skull and upper cervical spine: No skull base or calvarial metastasis is identified. Question abnormal C3 vertebral body. Sinuses/Orbits: Clear/normal Other: None IMPRESSION: Abbreviated exam. At least 4 metastases identified. 2 cm right temporal lobe metastasis with some surrounding edema. Question 7 mm metastasis at the right posterior frontal vertex. 7-8 mm metastasis of the left corpus callosum or subependymal brain. 8 mm metastasis at the left parietal vertex. Question abnormal C3 vertebral body. Electronically Signed   By: Nelson Chimes M.D.   On: 06/07/2018 10:25   Mr Brain Wo Contrast  Result Date: 05/30/2018 CLINICAL DATA:  Lung cancer staging. EXAM: MRI HEAD WITHOUT CONTRAST TECHNIQUE: Sagittal T1 sequence. COMPARISON:  CT HEAD January 26, 2011 FINDINGS: Technologist report patient was in too much pain to continue examination and study was terminated. Brain: Limited assessment. No significant mass effect. No hydrocephalus. No cerebellar tonsillar ectopia. No abnormal intrinsic T1 shortening. Vascular: Nondiagnostic. Skull and upper cervical spine: No suspicious calvarial bone marrow signal. Sinuses/Orbits: Nondiagnostic. Other: None. IMPRESSION: Limited sagittal T1 sequence. Nondiagnostic assessment for metastasis. Consider repeat examination with  appropriate medication. Electronically Signed   By: Elon Alas M.D.   On: 05/30/2018 02:54   Mr Jeri Cos EL Contrast  Result Date: 06/21/2018 CLINICAL DATA:  Follow-up metastatic lung cancer. EXAM: MRI HEAD WITHOUT AND WITH CONTRAST TECHNIQUE: Multiplanar, multiecho pulse sequences of the brain and surrounding structures were obtained without and with intravenous contrast. CONTRAST:  90mL MULTIHANCE GADOBENATE DIMEGLUMINE 529 MG/ML IV SOLN COMPARISON:  Noncontrast MRI head June 07, 2018 FINDINGS: Sequences vary from moderate to severely motion degraded. Motion degraded examination. Patient received Percocet. BRAIN Lesions: At least 4: 14 x 15 mm enhancing lesion located RIGHT temporal lobe with mild surrounding vasogenic edema and and no significant mass effect seen on series 12, image 62. 8 x 9 mm enhancing lesion located LEFT frontal periventricular white matter without significant vasogenic edema or mass effect seen on series 12, image 98. 6 x 6 mm enhancing lesion located LEFT frontal convexity cortex with proportional vasogenic edema, no significant mass effect seen on series 12, image 126. 7 x 10 mm enhancing lesion located LEFT parietal convexity cortex with proportional vasogenic edema, no significant mass effect seen on series 12, image 130. 4 mm enhancing lesion located RIGHT mesial temporal lobe without vasogenic edema or mass effect may be artifact only seen on series 17, image 33. Other Brain findings: INTRACRANIAL CONTENTS: RIGHT cerebellar developmental venous anomaly. Subcentimeter reduced diffusion LEFT cerebellum with low ADC values. Subcentimeter reduced diffusion RIGHT parietal cortex with low ADC values. No susceptibility artifact to suggest hemorrhage. Patchy to confluent supratentorial white matter FLAIR T2 hyperintensities exclusive aforementioned abnormalities. Moderate parenchymal brain volume loss. No hydrocephalus. RIGHT cerebellar developmental venous anomaly. No abnormal  extra-axial fluid collections. Enhancement along the anterior falx. VASCULAR: Normal major intracranial vascular flow voids present at skull base. SKULL AND UPPER CERVICAL SPINE: No abnormal sellar expansion. Abnormally decreased bone marrow signal and faint enhancement C3 and C4 vertebral bodies concerning for osseous metastasis. Craniocervical junction maintained. SINUSES/ORBITS: The mastoid air-cells and included paranasal sinuses are well-aerated.The included ocular globes and orbital contents are non-suspicious. OTHER: None. IMPRESSION: 1. Moderate to severely motion degraded examination: 4-5 supratentorial intraparenchymal metastasis largest in RIGHT temporal lobe measuring 14 x 15 mm. C3 and C4 suspected osseous metastasis. No pathologic fracture. 2. Acute subcentimeter RIGHT parietal and LEFT cerebellar infarcts. 3. Moderate parenchymal brain volume loss and moderate chronic small  vessel ischemic changes. 4. These results will be called to the ordering clinician or representative by the Radiologist Assistant, and communication documented in the PACS or zVision Dashboard. Electronically Signed   By: Elon Alas M.D.   On: 06/21/2018 23:05   Nm Pet Image Initial (pi) Skull Base To Thigh  Result Date: 05/29/2018 CLINICAL DATA:  Initial treatment strategy for non-small cell lung cancer. EXAM: NUCLEAR MEDICINE PET SKULL BASE TO THIGH TECHNIQUE: 7.97 mCi F-18 FDG was injected intravenously. Full-ring PET imaging was performed from the skull base to thigh after the radiotracer. CT data was obtained and used for attenuation correction and anatomic localization. Fasting blood glucose: 106 mg/dl COMPARISON:  CT scans 05/18/2018 FINDINGS: Mediastinal blood pool activity: SUV max 2.60 NECK: No hypermetabolic lymph nodes in the neck. Incidental CT findings: none CHEST: The 5 x 3 cm peripheral left upper lobe lung mass versus pleural mass is markedly hypermetabolic with SUV max of 46.96. Findings consistent with  associated chest wall invasion involving and partially destroying the third and fourth left anterior ribs with pathologic fractures. Diffuse pulmonary metastatic disease with innumerable hypermetabolic pulmonary lesions throughout both lungs. 2.6 cm index lesion at the left lung base has an SUV max of 21.37. Index lesion in the right lower lobe on image number 42 measures 18 mm and SUV max is 16.46. Right hilar adenopathy is hypermetabolic with SUV max of 29.52. Left hilar adenopathy is hypermetabolic with SUV max of 84.13. 12.5 mm left subpectoral lymph node on image 56 has an SUV max of 16.08. Incidental CT findings: none ABDOMEN/PELVIS: 3 cm segment 2 liver lesion is hypermetabolic with SUV max of 24.40. Bilateral adrenal gland metastasis. The 2.2 cm left adrenal gland lesion has an SUV max of 23.29 and the 15 mm right adrenal gland lesion has an SUV max of 12.62. No metastatic lymphadenopathy. Incidental CT findings: none SKELETON: Diffuse osseous metastatic disease involving the axial and appendicular skeleton. No spinal canal compromise is identified. No pathologic fractures other than the left third and fourth anterior ribs but those are involved with direct invasion of tumor. Incidental CT findings: none IMPRESSION: 1. Large hypermetabolic peripheral left lung mass invading the chest wall and adjacent third and fourth anterior ribs consistent with known neoplasm. 2. Diffuse pulmonary metastatic disease. 3. Bilateral hilar adenopathy. 4. Diffuse osseous metastatic disease. 5. Single metastatic focus in the left hepatic lobe and bilateral adrenal gland metastasis. Electronically Signed   By: Marijo Sanes M.D.   On: 05/29/2018 17:06   Ct Biopsy  Result Date: 06/02/2018 INDICATION: 77 year old male with extensive multifocal metastatic disease which may be widely metastatic lung cancer. The dominant mass is pleural based in the periphery of the left upper lobe. EXAM: CT-guided biopsy left lobe pulmonary  nodule Interventional Radiologist:  Criselda Peaches, MD MEDICATIONS: None. ANESTHESIA/SEDATION: Fentanyl 100 mcg IV; Versed 2 mg IV Moderate Sedation Time:  11 minutes The patient was continuously monitored during the procedure by the interventional radiology nurse under my direct supervision. FLUOROSCOPY TIME:  Fluoroscopy Time: 0 minutes 0 seconds (0 mGy). COMPLICATIONS: None immediate. Estimated blood loss:  0 PROCEDURE: Informed written consent was obtained from the patient after a thorough discussion of the procedural risks, benefits and alternatives. All questions were addressed. Maximal Sterile Barrier Technique was utilized including caps, mask, sterile gowns, sterile gloves, sterile drape, hand hygiene and skin antiseptic. A timeout was performed prior to the initiation of the procedure. A planning axial CT scan was performed. The pleural base mass  in the periphery of the left upper lobe was successfully identified. A suitable skin entry site was selected and marked. The region was then sterilely prepped and draped in standard fashion using Betadine skin prep. Local anesthesia was attained by infiltration with 1% lidocaine. A small dermatotomy was made. Under intermittent CT fluoroscopic guidance, a 17 gauge trocar needle was advanced into the lung and positioned at the margin of the nodule. Multiple 18 gauge core biopsies were then coaxially obtained using the BioPince automated biopsy device. Biopsy specimens were placed in formalin and delivered to pathology for further analysis. The biopsy device and introducer needle were removed. Post biopsy axial CT imaging demonstrates no evidence of immediate complication. There is no pneumothorax. Mild perilesional alveolar hemorrhage is not unexpected. The patient tolerated the procedure well. IMPRESSION: Technically successful CT-guided biopsy of pleural based left upper lobe pulmonary mass. Electronically Signed   By: Jacqulynn Cadet M.D.   On: 06/02/2018  13:33   Dg Chest Port 1 View  Result Date: 06/02/2018 CLINICAL DATA:  77 year old male status post CT-guided left lung biopsy at 1230 hours today. EXAM: PORTABLE CHEST 1 VIEW COMPARISON:  CT biopsy images today.  PET-CT 05/29/2018. FINDINGS: Portable AP semi upright view at 1317 hours. The biopsied left lateral lung rounded mass appears stable in size and configuration. No pneumothorax or pleural effusion. Numerous additional bilateral pulmonary nodules redemonstrated. Stable cardiac size and mediastinal contours. Visualized tracheal air column is within normal limits. No new pulmonary opacity. Negative visible bowel gas pattern. Stable visualized osseous structures. IMPRESSION: No pneumothorax or new abnormality status post left lung CT-guided biopsy. Electronically Signed   By: Genevie Ann M.D.   On: 06/02/2018 13:28   Mr Jodene Nam Head Wo Contrast  Result Date: 06/22/2018 CLINICAL DATA:  Initial evaluation for acute confusion. Recently identified infarcts with known metastatic disease. EXAM: MRA HEAD WITHOUT CONTRAST TECHNIQUE: Angiographic images of the Circle of Willis were obtained using MRA technique without intravenous contrast. COMPARISON:  Previous brain MRI from 06/21/2018. FINDINGS: ANTERIOR CIRCULATION: Distal cervical segments of the internal carotid arteries are patent with antegrade flow. Petrous, cavernous, and supraclinoid segments widely patent without stenosis. A1 segments widely patent. Normal anterior communicating artery. Anterior cerebral arteries widely patent to their distal aspects without stenosis. Short-segment mild stenosis at the proximal right M1 segment (series 303, image 9). M1 segments otherwise widely patent bilaterally. No proximal M2 occlusion. Distal MCA branches well perfused and fairly symmetric. POSTERIOR CIRCULATION: Vertebral arteries widely patent to the vertebrobasilar junction. Partially visualized posterior inferior cerebral arteries patent bilaterally. Basilar widely  patent to its distal aspect. Superior cerebral arteries patent bilaterally. Both of the posterior cerebral artery supplied via the basilar. Mild atheromatous irregularity within the PCAs bilaterally without significant stenosis. PCAs are widely patent to their distal aspects. No intracranial aneurysm or other vascular abnormality. IMPRESSION: 1. Negative intracranial MRA with no large vessel occlusion identified. 2. Short-segment mild proximal right M1 stenosis. 3. Additional minor atherosclerotic change for patient age. No other hemodynamically significant or correctable stenosis identified. Electronically Signed   By: Jeannine Boga M.D.   On: 06/22/2018 19:47    Microbiology: Recent Results (from the past 240 hour(s))  Culture, Urine     Status: None   Collection Time: 06/19/18  2:16 PM  Result Value Ref Range Status   Specimen Description   Final    URINE, CLEAN CATCH Performed at [facility] [address] [place], [place] [address]    Special Requests  Final    NONE Performed at [facility], [place] [address] [place], [place] [address]    Culture   Final    NO GROWTH Performed at [facility], [place] [address], [place], [place] [address]    Report Status 06/20/2018 FINAL  Final  MRSA PCR Screening     Status: None   Collection Time: 06/22/18  2:19 PM  Result Value Ref Range Status   MRSA by PCR NEGATIVE NEGATIVE Final    Comment:        The GeneXpert MRSA Assay (FDA approved for NASAL specimens only), is one component of a comprehensive MRSA colonization surveillance program. It is not intended to diagnose MRSA infection nor to guide or monitor treatment for MRSA infections. Performed at [facility], [place] [address] [place], [place] [address]   Gastrointestinal Panel by PCR , Stool     Status: None   Collection Time: 06/23/18  4:50 AM  Result Value Ref Range Status   Campylobacter species NOT DETECTED  NOT DETECTED Final   Plesimonas shigelloides NOT DETECTED NOT DETECTED Final   Salmonella species NOT DETECTED NOT DETECTED Final   Yersinia enterocolitica NOT DETECTED NOT DETECTED Final   Vibrio species NOT DETECTED NOT DETECTED Final   Vibrio cholerae NOT DETECTED NOT DETECTED Final   Enteroaggregative E coli (EAEC) NOT DETECTED NOT DETECTED Final   Enteropathogenic E coli (EPEC) NOT DETECTED NOT DETECTED Final   Enterotoxigenic E coli (ETEC) NOT DETECTED NOT DETECTED Final   Shiga like toxin producing E coli (STEC) NOT DETECTED NOT DETECTED Final   Shigella/Enteroinvasive E coli (EIEC) NOT DETECTED NOT DETECTED Final   Cryptosporidium NOT DETECTED NOT DETECTED Final   Cyclospora cayetanensis NOT DETECTED NOT DETECTED Final   Entamoeba histolytica NOT DETECTED NOT DETECTED Final   Giardia lamblia NOT DETECTED NOT DETECTED Final   Adenovirus F40/41 NOT DETECTED NOT DETECTED Final   Astrovirus NOT DETECTED NOT DETECTED Final   Norovirus GI/GII NOT DETECTED NOT DETECTED Final   Rotavirus A NOT DETECTED NOT DETECTED Final   Sapovirus (I, II, IV, and V) NOT DETECTED NOT DETECTED Final    Comment: Performed at [facility], [place] [address] [place], [place] [address]     Labs: Basic Metabolic Panel: Recent Labs  Lab 06/20/18 [phone] 06/21/18 1638 06/22/18 0610 06/23/18 0334 06/24/18 0342 06/25/18 0432 06/25/18 0840 06/26/18 0434  NA 142 143 141 142 141 141  --  139  K 4.5 4.5 4.7 4.6 4.1 3.6  --  3.1*  CL 110 114* 115* 116* 117* 118*  --  112*  CO2 22 19* 19* 18* 17* 15*  --  20*  GLUCOSE 132* 104* 121* 89 92 81  --  191*  BUN 79* 60* 53* 40* 37* 34*  --  30*  CREATININE 2.01* 1.80* 1.74* 1.73* 1.89* 2.00*  --  2.21*  CALCIUM 7.8* 7.5* 7.1* 6.8* 6.5* 6.3*  --  6.0*  MG 2.5* 2.5* 2.4  --   --   --  2.0  --   PHOS 3.4  --   --   --   --   --  1.5* 1.6*  1.6*   Liver Function Tests: Recent Labs  Lab 06/20/18 0614 06/21/18 0634 06/22/18 0610 06/26/18 0434  AST 24  21 20   --   ALT 51* 42 36  --   ALKPHOS 143* 132* 120  --   BILITOT 0.8 1.1 0.9  --  PROT 6.1* 6.0* 5.6*  --   ALBUMIN 2.0* 1.9* 1.8* 1.6*   No results for input(s): LIPASE, AMYLASE in the last 168 hours. No results for input(s): AMMONIA in the last 168 hours. CBC: Recent Labs  Lab 06/21/18 0634 06/22/18 0610 06/23/18 0334 06/24/18 0342 06/25/18 0432 06/26/18 0434  WBC 4.6 2.5* 2.1* 2.7* 2.0* 1.7*  NEUTROABS 3.9  --  1.6*  --  1.2* 0.9*  HGB 8.7* 7.7* 7.9* 7.9* 7.3* 7.3*  HCT 27.6* 23.7* 24.9* 25.1* 22.8* 22.2*  MCV 74.0* 73.4* 74.1* 74.0* 73.8* 72.5*  PLT 218 175 160 149* 144* 147*   Cardiac Enzymes: No results for input(s): CKTOTAL, CKMB, CKMBINDEX, TROPONINI in the last 168 hours. BNP: BNP (last 3 results) No results for input(s): BNP in the last 8760 hours.  ProBNP (last 3 results) No results for input(s): PROBNP in the last 8760 hours.  CBG: Recent Labs  Lab 06/22/18 0825 06/23/18 0756 06/24/18 0806 06/25/18 0823 06/26/18 0739  GLUCAP 100* 76 83 75 148*       Signed:  Irine Seal MD.  [facility] 06/26/2018, 2:32 PM

## 2018-06-26 NOTE — Progress Notes (Signed)
Wife selected [facility], referral was given.

## 2018-06-26 NOTE — Progress Notes (Signed)
Pharmacy: phos replacement Phos = 1.6 after 30 mM kphos given yesterday. Na 139, K 3.1. SCr 2.21 Plan: kphos 40 mMol ( will provide K ~ 60 MEQ) F/u phos in am  Eudelia Bunch, Pharm.D [phone] 06/26/2018 8:44 AM
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
## 2018-06-26 NOTE — Progress Notes (Signed)
  Radiation Oncology         [phone] ________________________________  Name: OLSON LUCARELLI MRN: [id number]  Date: 06/08/2018  DOB: 05/14/1941  SIMULATION AND TREATMENT PLANNING NOTE  DIAGNOSIS:     ICD-10-CM   1. Bone metastasis ([place]) C79.51      Site:  Right shoulder  NARRATIVE:  The patient was brought to the [facility].  Identity was confirmed.  All relevant records and images related to the planned course of therapy were reviewed.   Written consent to proceed with treatment was confirmed which was freely given after reviewing the details related to the planned course of therapy had been reviewed with the patient.  Then, the patient was set-up in a stable reproducible  supine position for radiation therapy.  CT images were obtained.  Surface markings were placed.    Medically necessary complex treatment device(s) for immobilization:  1.  Vac-lock bag 2.  accuform device.   The CT images were loaded into the planning software.  Then the target and avoidance structures were contoured.  Treatment planning then occurred.  The radiation prescription was entered and confirmed.  A total of 2 complex treatment devices were fabricated which relate to the designed radiation treatment fields. Each of these customized fields/ complex treatment devices will be used on a daily basis during the radiation course. I have requested : Isodose Plan.   PLAN:  The patient will receive 30 Gy in 10 fractions.  ________________________________   Jodelle Gross, MD, PhD

## 2018-06-26 NOTE — Progress Notes (Signed)
Pt being discharged home with home hospice. Hospice has seen pt and family member. [facility] transport was set up for 1730. Pt currently ready for transport.

## 2018-06-27 LAB — PTH, INTACT AND CALCIUM
CALCIUM TOTAL (PTH): 6.4 mg/dL — AB (ref 8.6–10.2)
PTH: 180 pg/mL — ABNORMAL HIGH (ref 15–65)

## 2018-06-27 LAB — VITAMIN D 25 HYDROXY (VIT D DEFICIENCY, FRACTURES): Vit D, 25-Hydroxy: 13.6 ng/mL — ABNORMAL LOW (ref 30.0–100.0)

## 2018-06-28 ENCOUNTER — Telehealth: Payer: Self-pay | Admitting: Medical Oncology

## 2018-06-28 ENCOUNTER — Other Ambulatory Visit: Payer: Self-pay | Admitting: Medical Oncology

## 2018-06-28 DIAGNOSIS — C3412 Malignant neoplasm of upper lobe, left bronchus or lung: Secondary | ICD-10-CM

## 2018-06-28 MED ORDER — MORPHINE SULFATE ER 30 MG PO TBCR: 30.0000 mg | EXTENDED_RELEASE_TABLET | Freq: Two times a day (BID) | ORAL | 0 refills | Status: AC

## 2018-06-28 NOTE — Progress Notes (Signed)
Wife notified ot pick up new rx MS contin.

## 2018-06-28 NOTE — Telephone Encounter (Signed)
[facility] RN requests long acting Morphine -pt using roxanol q 2 hours prn and having pain in between. Per [facility] MS contin 30 q 12 hours ordered for 15 days.

## 2018-06-28 NOTE — Telephone Encounter (Signed)
MS contin rx faxed to local pharmacy

## 2018-06-29 ENCOUNTER — Ambulatory Visit: Payer: 59 | Admitting: Radiation Oncology

## 2018-07-06 ENCOUNTER — Ambulatory Visit: Payer: 59 | Admitting: Nurse Practitioner

## 2018-07-06 ENCOUNTER — Ambulatory Visit: Payer: 59

## 2018-07-06 ENCOUNTER — Encounter: Payer: 59 | Admitting: Nutrition

## 2018-07-06 ENCOUNTER — Other Ambulatory Visit: Payer: 59

## 2018-07-07 NOTE — Progress Notes (Signed)
FMLA for spouse, Darryon Bastin, successfully faxed to [place] at [employer] at [phone]. Mailed copy to patient address on file.

## 2018-07-08 ENCOUNTER — Ambulatory Visit: Payer: 59

## 2018-07-18 NOTE — Progress Notes (Signed)
  Radiation Oncology         [phone] ________________________________  Name: Zachary Hobbs MRN: [id number]  Date: 06/23/2018  DOB: [birth year]  End of Treatment Note  Diagnosis:   77 y.o. male with Stage IV, NSCLC, adenocarcinoma of the left lung with multiple sites of metastatic disease including contralateral lung, bilateral adrenal glands, bone and brain metastases  Indication for treatment:  Palliative       Radiation treatment dates:   06/12/2018 - 06/23/2018  Site/dose:   Right Shoulder / 30 Gy in 10 fractions  Beams/energy:   Isodose Plan / 6X, 10X Photon  Narrative: The patient tolerated radiation treatment relatively well.   His pain in the shoulder is improved. No difficulties with radiation treatment.  Plan: The patient has completed radiation treatment. The patient remains an inpatient. He has begun chemotherapy. He wanted to hold off on beginning additional radiation treatment at this time. I will discuss this issue with medical oncology, and we will need to coordinate the timing of any possible treatment with his chemotherapy. We will try to work out a plan regarding this next week. The patient will return to radiation oncology clinic for routine followup in one month. I advised them to call or return sooner if they have any questions or concerns related to their recovery or treatment.  ------------------------------------------------  Jodelle Gross, MD, PhD  This document serves as a record of services personally performed by Kyung Rudd, MD. It was created on his behalf by Rae Lips, a trained medical scribe. The creation of this record is based on the scribe's personal observations and the provider's statements to them. This document has been checked and approved by the attending provider.

## 2018-07-27 ENCOUNTER — Ambulatory Visit: Payer: 59 | Admitting: Oncology

## 2018-07-27 ENCOUNTER — Other Ambulatory Visit: Payer: 59

## 2018-07-27 ENCOUNTER — Ambulatory Visit: Payer: 59

## 2018-07-29 ENCOUNTER — Ambulatory Visit: Payer: 59

## 2018-08-01 DEATH — deceased

## 2018-08-17 ENCOUNTER — Ambulatory Visit: Payer: 59 | Admitting: Internal Medicine

## 2018-08-17 ENCOUNTER — Ambulatory Visit: Payer: 59

## 2018-08-17 ENCOUNTER — Other Ambulatory Visit: Payer: 59

## 2018-08-19 ENCOUNTER — Ambulatory Visit: Payer: 59

## 2020-06-18 IMAGING — CT CT ANGIO CHEST
5 of 15 series · 17 of 46 positions shown · IV contrast (ISOVUE)
Comparison: Chest radiograph 05/18/2018

CLINICAL DATA: Significant weight loss. Smoking history. Abnormal
chest radiograph

EXAM:
CT ANGIOGRAPHY CHEST
CT ABDOMEN AND PELVIS WITH CONTRAST
TECHNIQUE: Multidetector CT imaging of the chest was performed using the
standard protocol during bolus administration of intravenous
contrast. Multiplanar CT image reconstructions and MIPs were
obtained to evaluate the vascular anatomy. Multidetector CT imaging
of the abdomen and pelvis was performed using the standard protocol
during bolus administration of intravenous contrast.
CONTRAST:  80mL 2SDOJO-F15 IOPAMIDOL (2SDOJO-F15) INJECTION 76%

[Series 4: axial st · axial · 0.71mm/px · z∈[+678,+1113]mm · 3 of 88 slices shown (1 of 2)]
[im 1/88  lung]
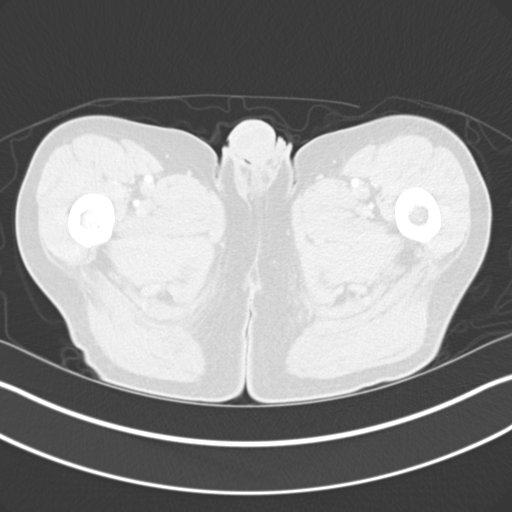
[im 44/88  soft-tissue]
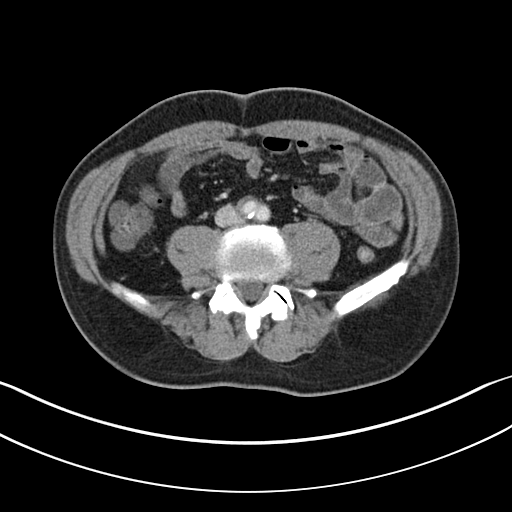
[im 88/88  lung]
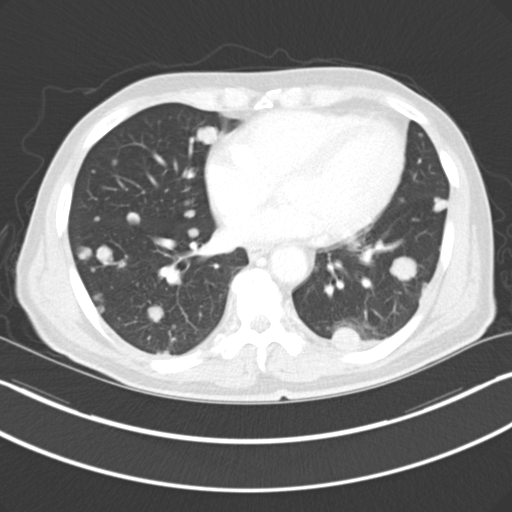

[Series 6: axial st · axial · 0.67mm/px · z∈[+1105,+1201]mm · 2 of 97 slices shown (2 of 2)]
[im 33/97  lung]
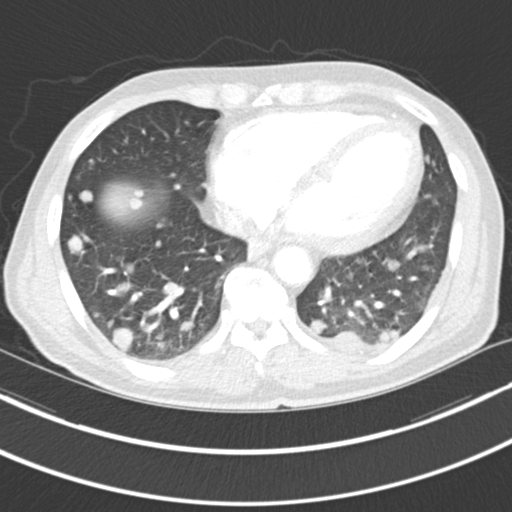
[im 65/97  lung]
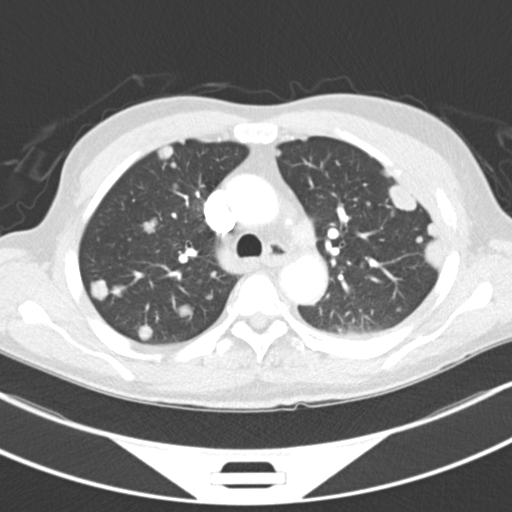

[Series 7: thins · axial · 0.67mm/px · z∈[+1033,+1270]mm · 8 of 291 slices shown]
[im 27/291  lung]
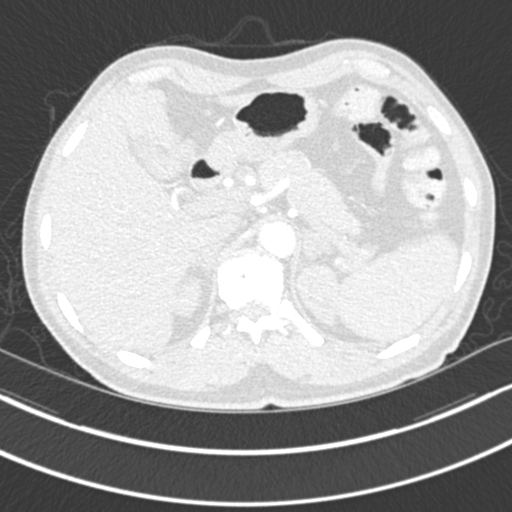
[im 53/291  lung]
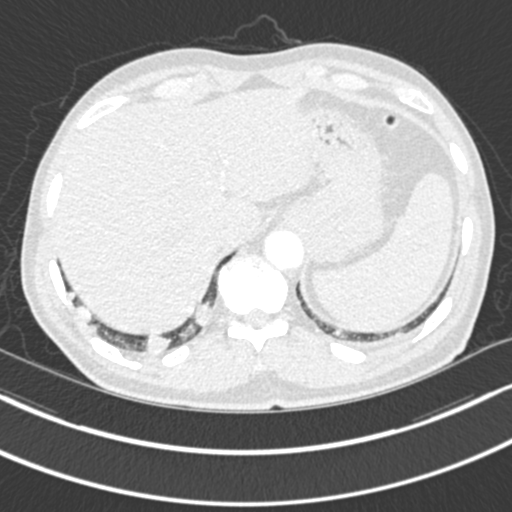
[im 106/291  lung]
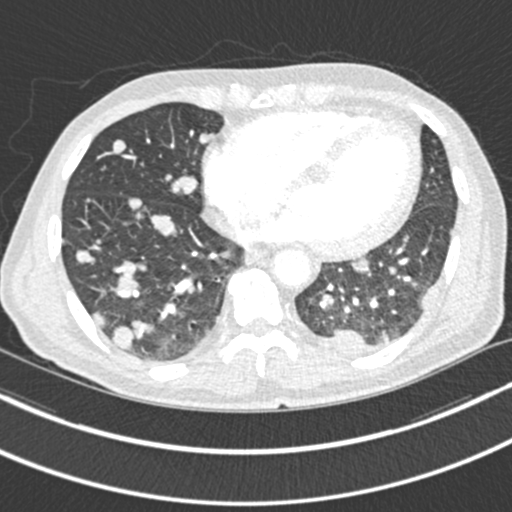
[im 132/291  lung]
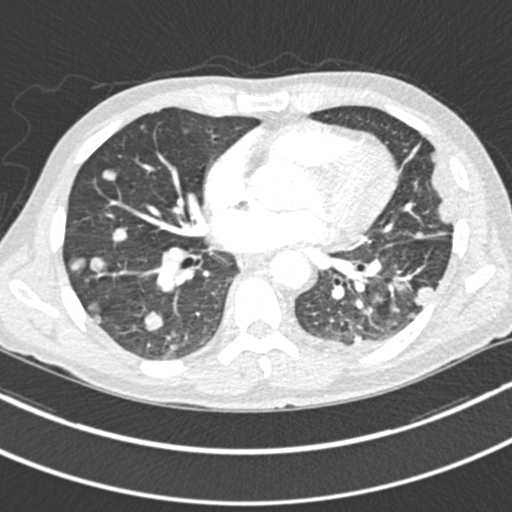
[im 159/291  lung]
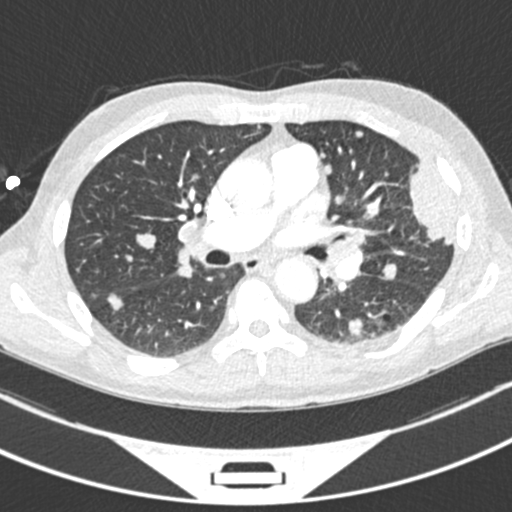
[im 185/291  lung]
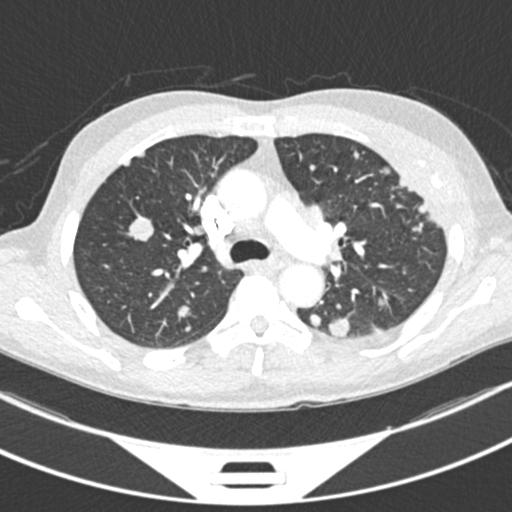
[im 238/291  lung]
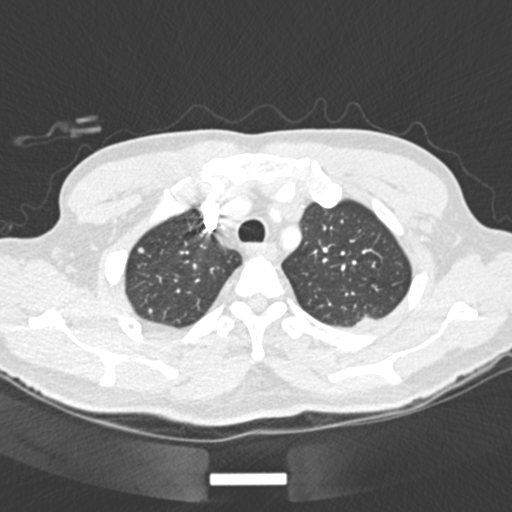
[im 264/291  lung]
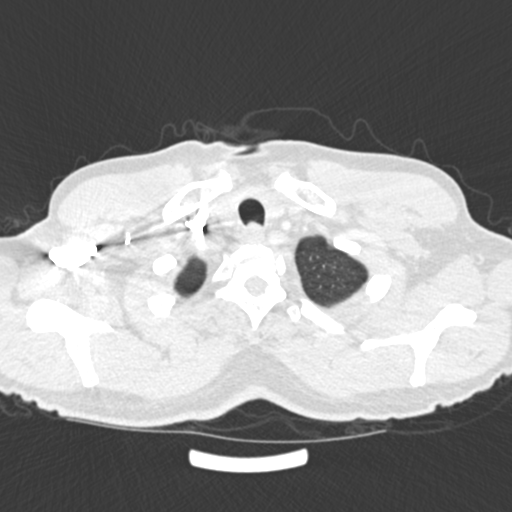

[Series 9: coronal mpr · coronal · 0.62mm/px · 1 of 117 slices shown]
[im 59/117  soft-tissue]
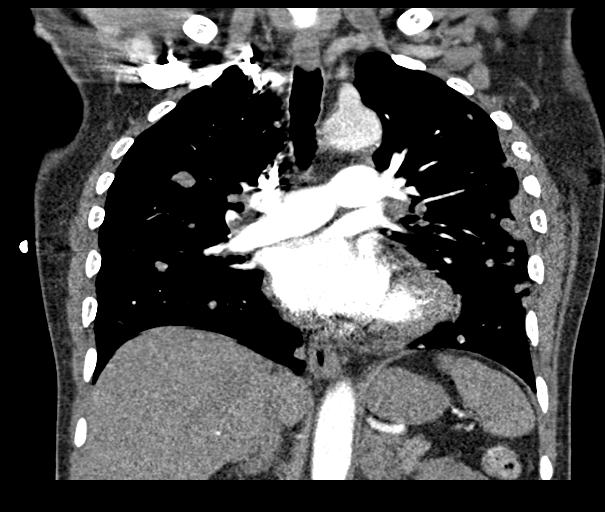

[Series 20: orthogonal ax · axial · 0.39mm/px · z∈[+1226,+1349]mm · 3 of 128 slices shown]
[im 32/128  lung]
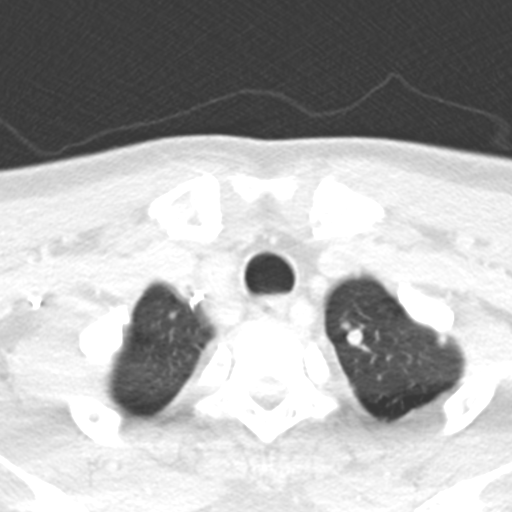
[im 64/128  lung]
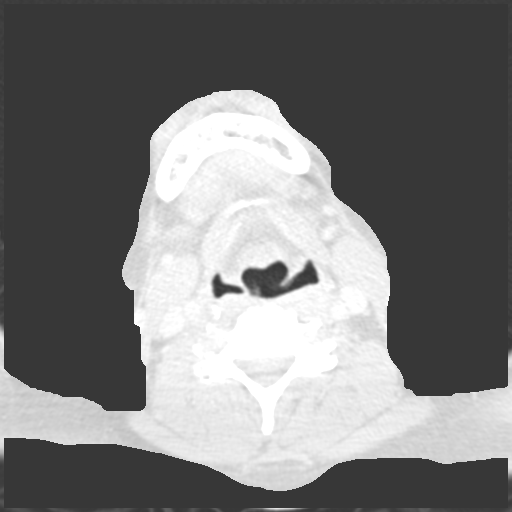
[im 96/128  lung]
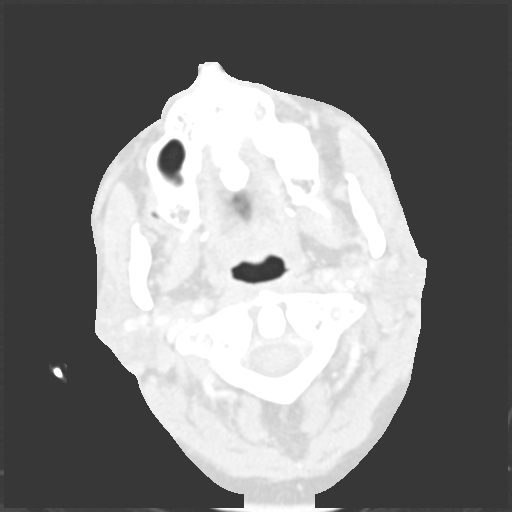

[17 of 46 positions shown; findings below may reference images not displayed]

FINDINGS: CTA CHEST FINDINGS

Cardiovascular: No filling defects within the pulmonary arteries to
suggest acute pulmonary embolism. No significant vascular findings.
Normal heart size. No pericardial effusion.

Mediastinum/Nodes: No axillary or supraclavicular adenopathy.
Bilateral hilar nodal enlargement with 1.5 cm hilar nodes.

Esophagus normal.

Lungs/Pleura: Innumerable bilateral pulmonary nodules of varying
size ranging from 5 mm to 20 mm. Approximately 15 nodules per lung.
There is a pleural base mass in the LEFT upper lobe laterally
measuring 53 x 24 mm. There is osseous invasion of adjacent LEFT
third fourth ribs anteriorly (image 44/8 adjacent to this pleural
base mass per

Musculoskeletal: Chest wall invasion in the anterior LEFT chest
involving the anterior aspect of the third and fourth ribs with
mottled appearance of the ribs and pathologic fracture medially in
the third rib. This is just superior to the pleural-based LEFT upper
lobe mass.

Review of the MIP images confirms the above findings.

CT ABDOMEN and PELVIS FINDINGS

Hepatobiliary: Dilute density well-circumscribed ovoid lesion in the
superior aspect of the LEFT hepatic lobe (segment 4A) measures 3.2 x
2.4 cm. Tiny 2 mm lesion inferior RIGHT hepatic lobe (image [DATE]
normal gallbladder.

Pancreas: Pancreas is normal. No ductal dilatation. No pancreatic
inflammation.

Spleen: Normal spleen

Adrenals/urinary tract: There is nodule enlargement of the LEFT
adrenal gland to 19 x 25 mm mild thickening of the RIGHT adrenal
gland without discrete nodularity kidneys normal. Ureters and
bladder normal

Stomach/Bowel: Stomach, small-bowel cecum normal. Ascending,
transverse, and descending colon normal. Rectosigmoid colon normal.
No obstruction or mass.

Vascular/Lymphatic: Abdominal aorta is normal caliber with
atherosclerotic calcification. There is no retroperitoneal or
periportal lymphadenopathy. No pelvic lymphadenopathy.

Reproductive: Prostate normal

Other: No peritoneal metastasis or omental thickening

Musculoskeletal: No aggressive osseous lesion.

Review of the MIP images confirms the above findings.
IMPRESSION: Chest Impression:

1. No acute pulmonary embolism.
2. Bilateral pulmonary metastasis with innumerable nodules in both
lungs.
3. Pleural base mass in the LEFT upper lobe could represent a
metastatic deposit or potential primary lesion.
4. LEFT chest wall invasion adjacent to pleural base mass involving
the third and fourth ribs anteriorly.

Abdomen / Pelvis Impression:

1. Solitary low-density lesion in the LEFT hepatic lobe is
concerning for hepatic metastasis.
2. LEFT adrenal gland metastasis.
3. No evidence of colorectal primary. This pattern of metastatic
disease is suggestive of melanoma or small cell lung carcinoma.
Recommend tissue sampling
